# Patient Record
Sex: Female | Born: 1935 | Race: White | Hispanic: No | Smoking: Former smoker
Health system: Southern US, Community
[De-identification: ages and names within clinical notes are randomized; demographics above are authoritative.]

## PROBLEM LIST (undated history)

## (undated) DIAGNOSIS — M810 Age-related osteoporosis without current pathological fracture: Secondary | ICD-10-CM

## (undated) DIAGNOSIS — B1920 Unspecified viral hepatitis C without hepatic coma: Secondary | ICD-10-CM

## (undated) DIAGNOSIS — Z9289 Personal history of other medical treatment: Secondary | ICD-10-CM

## (undated) DIAGNOSIS — E039 Hypothyroidism, unspecified: Secondary | ICD-10-CM

## (undated) DIAGNOSIS — I350 Nonrheumatic aortic (valve) stenosis: Secondary | ICD-10-CM

## (undated) DIAGNOSIS — E7800 Pure hypercholesterolemia, unspecified: Secondary | ICD-10-CM

## (undated) DIAGNOSIS — R140 Abdominal distension (gaseous): Secondary | ICD-10-CM

## (undated) DIAGNOSIS — K254 Chronic or unspecified gastric ulcer with hemorrhage: Secondary | ICD-10-CM

## (undated) DIAGNOSIS — R17 Unspecified jaundice: Secondary | ICD-10-CM

## (undated) DIAGNOSIS — I4891 Unspecified atrial fibrillation: Secondary | ICD-10-CM

## (undated) DIAGNOSIS — C801 Malignant (primary) neoplasm, unspecified: Secondary | ICD-10-CM

## (undated) HISTORY — DX: Unspecified jaundice: R17

## (undated) HISTORY — DX: Hypothyroidism, unspecified: E03.9

## (undated) HISTORY — PX: FRACTURE SURGERY: SHX138

## (undated) HISTORY — DX: Unspecified atrial fibrillation: I48.91

## (undated) HISTORY — DX: Age-related osteoporosis without current pathological fracture: M81.0

## (undated) HISTORY — DX: Abdominal distension (gaseous): R14.0

---

## 1989-03-30 HISTORY — PX: CATARACT EXTRACTION W/ INTRAOCULAR LENS  IMPLANT, BILATERAL: SHX1307

## 2010-07-30 HISTORY — PX: HIP FRACTURE SURGERY: SHX118

## 2014-09-16 ENCOUNTER — Encounter: Payer: Self-pay | Admitting: *Deleted

## 2014-09-20 ENCOUNTER — Encounter: Payer: Self-pay | Admitting: Internal Medicine

## 2014-09-20 ENCOUNTER — Ambulatory Visit (INDEPENDENT_AMBULATORY_CARE_PROVIDER_SITE_OTHER): Payer: Self-pay | Admitting: Internal Medicine

## 2014-09-20 VITALS — BP 122/68 | HR 50 | Ht <= 58 in | Wt 120.2 lb

## 2014-09-20 DIAGNOSIS — I350 Nonrheumatic aortic (valve) stenosis: Secondary | ICD-10-CM

## 2014-09-20 DIAGNOSIS — I4891 Unspecified atrial fibrillation: Secondary | ICD-10-CM

## 2014-09-20 LAB — CBC
HCT: 39.9 % (ref 36.0–46.0)
Hemoglobin: 13.3 g/dL (ref 12.0–15.0)
MCHC: 33.3 g/dL (ref 30.0–36.0)
MCV: 91.8 fl (ref 78.0–100.0)
PLATELETS: 108 10*3/uL — AB (ref 150.0–400.0)
RBC: 4.34 Mil/uL (ref 3.87–5.11)
RDW: 13.8 % (ref 11.5–15.5)
WBC: 4.5 10*3/uL (ref 4.0–10.5)

## 2014-09-20 LAB — COMPREHENSIVE METABOLIC PANEL
ALK PHOS: 47 U/L (ref 39–117)
ALT: 75 U/L — ABNORMAL HIGH (ref 0–35)
AST: 73 U/L — AB (ref 0–37)
Albumin: 3.8 g/dL (ref 3.5–5.2)
BUN: 16 mg/dL (ref 6–23)
CALCIUM: 9.8 mg/dL (ref 8.4–10.5)
CO2: 29 meq/L (ref 19–32)
Chloride: 107 mEq/L (ref 96–112)
Creatinine, Ser: 0.57 mg/dL (ref 0.40–1.20)
GFR: 107.59 mL/min (ref >60.00)
Glucose, Bld: 76 mg/dL (ref 70–99)
Potassium: 4.1 mEq/L (ref 3.5–5.1)
Sodium: 143 mEq/L (ref 135–145)
TOTAL PROTEIN: 6.7 g/dL (ref 6.0–8.3)
Total Bilirubin: 0.4 mg/dL (ref 0.2–1.2)

## 2014-09-20 LAB — TSH: TSH: 0.46 u[IU]/mL (ref 0.35–4.50)

## 2014-09-20 NOTE — Progress Notes (Signed)
Cardiology Office Note   Date:  09/20/2014   ID:  [name], [name] 02/10/1929, MRN [id number]  PCP:  No primary care provider on file.  Cardiologist:   Dorris Carnes, MD   No chief complaint on file. Patinet presents for continued evaluation of CP, atrial fib and aortic stenosis.      History of Present Illness: Diana Pearson is a 81 y.o. female with a history of atrial fib,  Had been complaining of L sided CP with SOB  In October.   He was admitted to [facility]  in October with afib with RVR  Converted to SR.  Troponin was elevated (2.8 max), felt due to strain.  Note that synthyrod had been increased due to elevated TSH   Patinet also had echo which showed moderate to severe AS    Since October the patients daughter says she has been doing pretty good  Breathing is OK  No palpitations  No sweats  No CP         Current Outpatient Prescriptions  Medication Sig Dispense Refill  . levothyroxine (SYNTHROID, LEVOTHROID) 88 MCG tablet Take 88 mcg by mouth daily before breakfast.    . metoprolol tartrate (LOPRESSOR) 25 MG tablet Take 12.5 mg by mouth 2 (two) times daily.    . pravastatin (PRAVACHOL) 20 MG tablet Take 20 mg by mouth daily.     No current facility-administered medications for this visit.    Allergies:   Review of patient's allergies indicates no known allergies.   Past Medical History  Diagnosis Date  . Hypothyroidism   . Osteoporosis   . Abdominal bloating     Past Surgical History  Procedure Laterality Date  . Right hip fracture       Social History:  The patient  reports that she has never smoked. She does not have any smokeless tobacco history on file. She reports that she does not drink alcohol or use illicit drugs.   Family History:  The patient's family history includes Stroke in her mother.    ROS:  Please see the history of present illness. All other systems are reviewed and  Negative to the above problem except as noted.    PHYSICAL  EXAM: VS:  BP 122/68 mmHg  Pulse 50  Ht '4\' 9"'  (1.448 m)  Wt 120 lb 3.2 oz (54.522 kg)  BMI 26.00 kg/m2  GEN: Well nourished, well developed, in no acute distress HEENT: normal Neck: no JVD, carotid bruits, or masses Cardiac: RRR; Gr III/VI systolic murmur LSB rubs, or gallops,no edema  Respiratory:  clear to auscultation bilaterally, normal work of breathing GI: soft, nontender, nondistended, + BS  No hepatomegaly  MS: no deformity Moving all extremities   Skin: warm and dry, no rash Neuro:  Strength and sensation are intact Psych: euthymic mood, full affect   EKG:  EKG is ordered today.   Lipid Panel No results found for: CHOL, TRIG, HDL, CHOLHDL, VLDL, LDLCALC, LDLDIRECT    Wt Readings from Last 3 Encounters:  09/20/14 120 lb 3.2 oz (54.522 kg)      ASSESSMENT AND PLAN: 1.  Atrial fib  Currently in SR  I would recomm anticoagulation if possible Will check labs today  2. AOrtic stensosis  Moderate on echo  Wiould get f/u echo in May    3.  Hx of elevated troponin  Pt currently without CP  May have been demand ischemia in setting of afib with RVR.  Without symtpoms I would not  push for further testing  Only if develops symtpoms      Current medicines are reviewed at length with the patient today.  The patient does not have concerns regarding medicines.  The following changes have been made: N\o changes    Labs/ tests ordered today include: CBC, CMET, TSH  Echo in April    Orders Placed This Encounter  Procedures  . CBC  . Comp Met (CMET)  . TSH  . EKG 12-Lead  . 2D Echocardiogram without contrast     Disposition:   FU with  in   Signed, Dorris Carnes, MD  09/20/2014 3:07 PM    [facility] [place], [place], [place]  [address] Phone: [phone]; Fax: [phone]

## 2014-09-20 NOTE — Patient Instructions (Signed)
Your physician recommends that you continue on your current medications as directed. Please refer to the Current Medication list given to you today. Your physician recommends that you return for lab work in: today (cbc, cmet, tsh)  Your physician has requested that you have an echocardiogram. Echocardiography is a painless test that uses sound waves to create images of your heart. It provides your doctor with information about the size and shape of your heart and how well your heart's chambers and valves are working. This procedure takes approximately one hour. There are no restrictions for this procedure.  PLEASE SCHEDULE THE ECHO FOR April OR May 2016   Your physician recommends that you schedule a follow-up appointment in: May/June 2016 with Dr. Harrington Challenger.

## 2014-09-30 ENCOUNTER — Telehealth: Payer: Self-pay | Admitting: Internal Medicine

## 2014-09-30 DIAGNOSIS — R7989 Other specified abnormal findings of blood chemistry: Secondary | ICD-10-CM

## 2014-09-30 NOTE — Telephone Encounter (Signed)
New Msg       Pt daughter Geoffery Lyons returning call.    Please call back.

## 2014-10-01 NOTE — Telephone Encounter (Signed)
Labs show platelets are a little low Also , liver enzymes are elevated-- not severely, more mild But need to evaluate before starting anticoagulation   Thyroid function is normal  Recomm: Repeat Liver panel, CBC, Gamma GGT, ANA   Daughter informed.  She verbalizes understanding.  Pt will come Tuesday for labs.

## 2014-10-05 ENCOUNTER — Other Ambulatory Visit (INDEPENDENT_AMBULATORY_CARE_PROVIDER_SITE_OTHER): Payer: Self-pay

## 2014-10-05 DIAGNOSIS — R7989 Other specified abnormal findings of blood chemistry: Secondary | ICD-10-CM

## 2014-10-05 LAB — HEPATIC FUNCTION PANEL
ALT: 85 U/L — AB (ref 0–35)
AST: 79 U/L — AB (ref 0–37)
Albumin: 4 g/dL (ref 3.5–5.2)
Alkaline Phosphatase: 54 U/L (ref 39–117)
Bilirubin, Direct: 0.2 mg/dL (ref 0.0–0.3)
Total Bilirubin: 0.6 mg/dL (ref 0.2–1.2)
Total Protein: 7.2 g/dL (ref 6.0–8.3)

## 2014-10-05 LAB — CBC
HCT: 42.7 % (ref 36.0–46.0)
Hemoglobin: 14.4 g/dL (ref 12.0–15.0)
MCHC: 33.8 g/dL (ref 30.0–36.0)
MCV: 91.2 fl (ref 78.0–100.0)
PLATELETS: 110 10*3/uL — AB (ref 150.0–400.0)
RBC: 4.68 Mil/uL (ref 3.87–5.11)
RDW: 13.7 % (ref 11.5–15.5)
WBC: 4 10*3/uL (ref 4.0–10.5)

## 2014-10-05 LAB — GAMMA GT: GGT: 37 U/L (ref 7–51)

## 2014-10-06 ENCOUNTER — Telehealth: Payer: Self-pay | Admitting: Internal Medicine

## 2014-10-06 LAB — ANA: ANA: NEGATIVE

## 2014-10-06 NOTE — Telephone Encounter (Signed)
New problem   Pt's daughter want to know results of labs that was taken yesterday. Please advise.

## 2014-10-11 MED ORDER — PRAVASTATIN SODIUM 20 MG PO TABS: 20.0000 mg | ORAL_TABLET | Freq: Every day | ORAL | Status: DC

## 2014-10-11 MED ORDER — METOPROLOL TARTRATE 25 MG PO TABS: 12.5000 mg | ORAL_TABLET | Freq: Two times a day (BID) | ORAL | Status: DC

## 2014-10-11 NOTE — Telephone Encounter (Signed)
I made Dr Harrington Challenger aware that I was attempting to get [place] of liver done at [facility] in Oct 2015.  Dr Harrington Challenger asked me to check to see if pt had Hepatitis titers done at [facility] in Oct 2015 before I scheduled pt to have them here.  I spoke with [facility] medical records, [phone] and she did confirm pt had abdominal ultrasound and a series of lab done there in October 2015, she could not tell me if Hepatitis titers had been done.  [facility] medical records would not give me results over the telephone.  [facility] medical records  asked me to fax a cover sheet on our fax letter head with the information Dr Harrington Challenger was requesting to them at [phone] and she will fax records to Dr Harrington Challenger.   I have faxed this to [place] records. Pt's daughter is aware that Dr Harrington Challenger is requesting records from [facility], will review them, then make a decision if further testing is necessary.

## 2014-10-11 NOTE — Telephone Encounter (Signed)
Result Note     I have reviewed labs with GI service    With liver enzymes being elevated and platelets being a little low woud recomm checking     Hepatitis markers (Hep C antibody, Hep B antibody, Hep E antibody, Hep A antibody)    Would also recomm a USN of liver to look for cirrhosis    Important to do this if thinking about a blood thinner.    Informed patient's daughter. Will bring patient tomorrow for labs Thinks pt had an US liver in October at [facility].

## 2014-10-11 NOTE — Telephone Encounter (Signed)
F/U ° ° ° ° ° ° ° ° ° °Pt returning call. Please call back.  °

## 2014-10-28 ENCOUNTER — Telehealth: Payer: Self-pay | Admitting: *Deleted

## 2014-10-28 NOTE — Telephone Encounter (Signed)
Left message at medical records at [facility] [phone], stating Dr. Harrington Challenger has not received the requested records. Requested call back.

## 2014-10-29 DIAGNOSIS — I350 Nonrheumatic aortic (valve) stenosis: Secondary | ICD-10-CM

## 2014-10-29 HISTORY — DX: Nonrheumatic aortic (valve) stenosis: I35.0

## 2014-11-15 ENCOUNTER — Ambulatory Visit (HOSPITAL_COMMUNITY): Payer: Self-pay | Attending: Cardiovascular Disease | Admitting: Radiology

## 2014-11-15 DIAGNOSIS — R079 Chest pain, unspecified: Secondary | ICD-10-CM

## 2014-11-15 DIAGNOSIS — I359 Nonrheumatic aortic valve disorder, unspecified: Secondary | ICD-10-CM

## 2014-11-15 DIAGNOSIS — I350 Nonrheumatic aortic (valve) stenosis: Secondary | ICD-10-CM | POA: Insufficient documentation

## 2014-11-15 DIAGNOSIS — I4891 Unspecified atrial fibrillation: Secondary | ICD-10-CM | POA: Insufficient documentation

## 2014-11-15 DIAGNOSIS — R011 Cardiac murmur, unspecified: Secondary | ICD-10-CM

## 2014-11-15 NOTE — Progress Notes (Signed)
Echocardiogram performed.  

## 2014-12-03 ENCOUNTER — Telehealth: Payer: Self-pay | Admitting: Internal Medicine

## 2014-12-03 DIAGNOSIS — R748 Abnormal levels of other serum enzymes: Secondary | ICD-10-CM

## 2014-12-03 NOTE — Telephone Encounter (Signed)
Patient calling about GI referral, according to result notes (below) from hepatic panel this would be appropriate. Consulted Dr. Harrington Challenger, and she recommended GI referral. Referral has been ordered and patient is aware.   Notes Recorded by Rodman Key, RN on 10/11/2014 at 1:11 PM Patient's daughter informed. Will bring patient tomorrow for blood work Thinks there was an [place] of liver in records from October at [facility].  Notes Recorded by Fay Records, MD on 10/11/2014 at 9:02 AM I have reviewed labs with GI service With liver enzymes being elevated and platelets being a little low woud recomm checking  Hepatitis markers (Hep C antibody, Hep B antibody, Hep E antibody, Hep A antibody) Would also recomm a USN of liver to look for cirrhosis Important to do this if thinking about a blood thinner.

## 2014-12-03 NOTE — Telephone Encounter (Signed)
New message      Daughter want to know if Dr Harrington Challenger would referr pt to see a GI?  She does not have a PCP and need to be seen because of her liver

## 2014-12-06 ENCOUNTER — Encounter: Payer: Self-pay | Admitting: Gastroenterology

## 2014-12-08 ENCOUNTER — Other Ambulatory Visit: Payer: Self-pay

## 2014-12-13 ENCOUNTER — Ambulatory Visit (INDEPENDENT_AMBULATORY_CARE_PROVIDER_SITE_OTHER): Payer: No Typology Code available for payment source | Admitting: Gastroenterology

## 2014-12-13 ENCOUNTER — Other Ambulatory Visit (INDEPENDENT_AMBULATORY_CARE_PROVIDER_SITE_OTHER): Payer: No Typology Code available for payment source

## 2014-12-13 ENCOUNTER — Encounter: Payer: Self-pay | Admitting: Gastroenterology

## 2014-12-13 VITALS — BP 128/72 | HR 60 | Ht <= 58 in | Wt 120.0 lb

## 2014-12-13 DIAGNOSIS — R7989 Other specified abnormal findings of blood chemistry: Secondary | ICD-10-CM

## 2014-12-13 DIAGNOSIS — R945 Abnormal results of liver function studies: Principal | ICD-10-CM

## 2014-12-13 DIAGNOSIS — R932 Abnormal findings on diagnostic imaging of liver and biliary tract: Secondary | ICD-10-CM

## 2014-12-13 DIAGNOSIS — D696 Thrombocytopenia, unspecified: Secondary | ICD-10-CM

## 2014-12-13 LAB — FERRITIN: FERRITIN: 237.3 ng/mL (ref 10.0–291.0)

## 2014-12-13 LAB — PROTIME-INR
INR: 1.1 ratio — AB (ref 0.8–1.0)
PROTHROMBIN TIME: 11.7 s (ref 9.6–13.1)

## 2014-12-13 NOTE — Progress Notes (Signed)
    History of Present Illness: This is an 81 year old female referred by Dorris Carnes, MD for the evaluation of elevated transaminases. Patient is accompanied by her daughter who provides translation. Patient has severe aortic stenosis by recent echocardiogram and she has atrial fibrillation. Records show transaminases have been mildly elevated in the range of twice normal dating back to blood work from [facility] in October 2015. ANA negative. No other hepatic serologies performed. Daughter relates that Hep C was diagnosed 10 years ago in [place], [place] when elevated LFTs were noted at that time. Daughter states LFTs have been elevated over the past 10 years. I reviewed records from [facility] from October 2015. Abdominal ultrasound was performed on 05/06/2014 findings of tiny left renal cysts, several scattered non-shadowing echogenic foci in the liver and questionable nodular contour of the liver-possible cirrhosis. Recent CBC showed platelets 108k.  Denies weight loss, abdominal pain, constipation, diarrhea, change in stool caliber, melena, hematochezia, nausea, vomiting, dysphagia, reflux symptoms, chest pain.  Review of Systems: Pertinent positive and negative review of systems were noted in the above HPI section. All other review of systems were otherwise negative.  Current Medications, Allergies, Past Medical History, Past Surgical History, Family History and Social History were reviewed in [employer] record.  Physical Exam: General: Well developed, well nourished, elderly, no acute distress Head: Normocephalic and atraumatic Eyes:  sclerae anicteric, EOMI Ears: Normal auditory acuity Mouth: No deformity or lesions Neck: Supple, no masses or thyromegaly Lungs: Clear throughout to auscultation Heart: Regular rate and rhythm; 3/6 harsh systolic murmur, no rubs or bruits Abdomen: Soft, non tender and non distended. No masses, hepatosplenomegaly or hernias noted. Normal Bowel  sounds Musculoskeletal: Symmetrical with no gross deformities  Skin: No lesions on visible extremities Pulses:  Normal pulses noted Extremities: No clubbing, cyanosis, edema or deformities noted Neurological: Alert oriented x 4, grossly nonfocal Cervical Nodes:  No significant cervical adenopathy Inguinal Nodes: No significant inguinal adenopathy Psychological:  Alert and cooperative. Normal mood and affect  Assessment and Recommendations:  1. Elevated transaminases. History of Hepatitis C. Abnormal hepatic ultrasound as above with echogenic non-shadowing hepatic foci and questionable nodular contour. Mild thrombocytopenia, also be secondary to hypersplenism versus other causes. R/O cirrhosis. Standard hepatic serologies. Consider abd CT. Further plans pending blood work results.   2. Aortic stenosis, severe.   3. Patient and her daughter are strongly advised to obtain a PCP  cc: Dorris Carnes, MD

## 2014-12-13 NOTE — Patient Instructions (Signed)
Your physician has requested that you go to the basement for  lab work before leaving today.   

## 2014-12-14 LAB — ANA: Anti Nuclear Antibody(ANA): NEGATIVE

## 2014-12-14 LAB — HEPATITIS C ANTIBODY: HCV Ab: REACTIVE — AB

## 2014-12-14 LAB — ANTI-SMOOTH MUSCLE ANTIBODY, IGG: Smooth Muscle Ab: 19 U (ref <20)

## 2014-12-14 LAB — HEPATITIS B SURFACE ANTIGEN: Hepatitis B Surface Ag: NEGATIVE

## 2014-12-14 LAB — HEPATITIS B SURFACE ANTIBODY,QUALITATIVE: HEP B S AB: POSITIVE — AB

## 2014-12-14 LAB — MITOCHONDRIAL ANTIBODIES: Mitochondrial M2 Ab, IgG: 1.34 — ABNORMAL HIGH (ref <0.91)

## 2014-12-15 LAB — CERULOPLASMIN: Ceruloplasmin: 26 mg/dL (ref 18–53)

## 2014-12-15 LAB — HEPATITIS C RNA QUANTITATIVE
HCV Quantitative Log: 6.75 {Log} — ABNORMAL HIGH (ref <1.18)
HCV Quantitative: 5647917 IU/mL — ABNORMAL HIGH (ref <15)

## 2014-12-16 ENCOUNTER — Other Ambulatory Visit: Payer: Self-pay

## 2014-12-16 DIAGNOSIS — R7989 Other specified abnormal findings of blood chemistry: Secondary | ICD-10-CM

## 2014-12-16 DIAGNOSIS — R932 Abnormal findings on diagnostic imaging of liver and biliary tract: Secondary | ICD-10-CM

## 2014-12-16 DIAGNOSIS — R945 Abnormal results of liver function studies: Secondary | ICD-10-CM

## 2014-12-20 ENCOUNTER — Other Ambulatory Visit (INDEPENDENT_AMBULATORY_CARE_PROVIDER_SITE_OTHER): Payer: No Typology Code available for payment source

## 2014-12-20 DIAGNOSIS — R945 Abnormal results of liver function studies: Secondary | ICD-10-CM

## 2014-12-20 DIAGNOSIS — R7989 Other specified abnormal findings of blood chemistry: Secondary | ICD-10-CM

## 2014-12-20 DIAGNOSIS — R932 Abnormal findings on diagnostic imaging of liver and biliary tract: Secondary | ICD-10-CM

## 2014-12-20 LAB — BUN: BUN: 17 mg/dL (ref 6–23)

## 2014-12-20 LAB — IBC PANEL
Iron: 138 ug/dL (ref 42–145)
SATURATION RATIOS: 32.7 % (ref 20.0–50.0)
Transferrin: 301 mg/dL (ref 212.0–360.0)

## 2014-12-20 LAB — CREATININE, SERUM: Creatinine, Ser: 0.71 mg/dL (ref 0.40–1.20)

## 2014-12-23 ENCOUNTER — Encounter: Payer: Self-pay | Admitting: Internal Medicine

## 2014-12-23 ENCOUNTER — Other Ambulatory Visit: Payer: Self-pay

## 2014-12-23 ENCOUNTER — Ambulatory Visit (INDEPENDENT_AMBULATORY_CARE_PROVIDER_SITE_OTHER): Payer: No Typology Code available for payment source | Admitting: Internal Medicine

## 2014-12-23 ENCOUNTER — Ambulatory Visit (INDEPENDENT_AMBULATORY_CARE_PROVIDER_SITE_OTHER)
Admission: RE | Admit: 2014-12-23 | Discharge: 2014-12-23 | Disposition: A | Discharge status: Home / Self Care | Payer: No Typology Code available for payment source | Source: Ambulatory Visit | Attending: Gastroenterology | Admitting: Gastroenterology

## 2014-12-23 VITALS — BP 160/78 | HR 70 | Ht <= 58 in | Wt 121.8 lb

## 2014-12-23 DIAGNOSIS — R7989 Other specified abnormal findings of blood chemistry: Secondary | ICD-10-CM

## 2014-12-23 DIAGNOSIS — R932 Abnormal findings on diagnostic imaging of liver and biliary tract: Secondary | ICD-10-CM

## 2014-12-23 DIAGNOSIS — R945 Abnormal results of liver function studies: Secondary | ICD-10-CM

## 2014-12-23 DIAGNOSIS — I350 Nonrheumatic aortic (valve) stenosis: Secondary | ICD-10-CM

## 2014-12-23 DIAGNOSIS — N83202 Unspecified ovarian cyst, left side: Secondary | ICD-10-CM

## 2014-12-23 DIAGNOSIS — I480 Paroxysmal atrial fibrillation: Secondary | ICD-10-CM

## 2014-12-23 MED ORDER — IOHEXOL 300 MG/ML  SOLN
100.0000 mL | Freq: Once | INTRAMUSCULAR | Status: AC | PRN
  Administered 2014-12-23: 100 mL via INTRAVENOUS

## 2014-12-23 NOTE — Progress Notes (Signed)
Cardiology Office Note   Date:  12/23/2014   ID:  Diana Pearson, DOB 06-14-1929, MRN [id number]  PCP:  No PCP Per Patient  Cardiologist:   Dorris Carnes, MD   No chief complaint on file.  F/u of atrial fib and aortic stenosis    History of Present Illness: Diana Pearson is a 81 y.o. female with a history of atrial fib, echo, Hyperlipidemia and liver dz I saw her in clinic in feb  She had an echo after this visit that showed severe AS. She has also been seen in GI by Barth Kirks  Evaluation of liver dysfunction  SInce seen she denies CP  No Dizziness  No SOB  No palpitations  Sleeping good  Current Outpatient Prescriptions  Medication Sig Dispense Refill  . levothyroxine (SYNTHROID, LEVOTHROID) 88 MCG tablet Take 88 mcg by mouth daily before breakfast.    . metoprolol tartrate (LOPRESSOR) 25 MG tablet Take 0.5 tablets (12.5 mg total) by mouth 2 (two) times daily. 45 tablet 3  . pravastatin (PRAVACHOL) 20 MG tablet Take 1 tablet (20 mg total) by mouth daily. 90 tablet 3   No current facility-administered medications for this visit.    Allergies:   Review of patient's allergies indicates no known allergies.   Past Medical History  Diagnosis Date  . Hypothyroidism   . Osteoporosis   . Abdominal bloating   . Atrial fibrillation   . Hepatitis     C    Past Surgical History  Procedure Laterality Date  . Right hip fracture       Social History:  The patient  reports that she has never smoked. She does not have any smokeless tobacco history on file. She reports that she does not drink alcohol or use illicit drugs.   Family History:  The patient's family history includes Stroke in her mother.    ROS:  Please see the history of present illness. All other systems are reviewed and  Negative to the above problem except as noted.    PHYSICAL EXAM: VS:  BP 160/78 mmHg  Pulse 70  Ht 4\' 9"  (1.448 m)  Wt 121 lb 12.8 oz (55.248 kg)  BMI 26.35 kg/m2  GEN: Well nourished, well  developed, in no acute distress HEENT: normal Neck: no JVD, carotid bruits, or masses Cardiac: RRR; Gr III/VI systolic murmur base  , rubs, or gallops,no edema  Respiratory:  clear to auscultation bilaterally, normal work of breathing GI: soft, nontender, nondistended, + BS  No hepatomegaly  MS: no deformity Moving all extremities   Skin: warm and dry, no rash Neuro:  Strength and sensation are intact Psych: euthymic mood, full affect   EKG:  EKG is not ordered today.   Lipid Panel No results found for: CHOL, TRIG, HDL, CHOLHDL, VLDL, LDLCALC, LDLDIRECT    Wt Readings from Last 3 Encounters:  12/23/14 121 lb 12.8 oz (55.248 kg)  12/13/14 120 lb (54.432 kg)  09/20/14 120 lb 3.2 oz (54.522 kg)      ASSESSMENT AND PLAN:  1.  Atrial fibrillation  CHADSVASc 3   No known recurrence.  The patient is currently not on anticoag  Need to clarify severity of liiver dz/    2.  Aortic stenosis  Severe by echo  She denies sympotms  Will follow clinically   3.  Liver Patient has been seen in GI  Will have CT today     Current medicines are reviewed at length with the patient today.  The patient does not have concerns regarding medicines.  The following changes have been made: NOne  Labs/ tests ordered today include:  Patinet to have CT scan now   No orders of the defined types were placed in this encounter.     Disposition:   FU with me in a few months  Signed, Dorris Carnes, MD  12/23/2014 10:14 AM    [facility] [address], [place], [place]  [address] Phone: [phone]; Fax: [phone]

## 2014-12-23 NOTE — Patient Instructions (Signed)
Medication Instructions:  NONE  Labwork: NONE  Testing/Procedures: NONE  Follow-Up: Your physician wants you to follow-up in: 5 MONTHS You will receive a reminder letter in the mail two months in advance. If you don't receive a letter, please call our office to schedule the follow-up appointment.   Any Other Special Instructions Will Be Listed Below (If Applicable).  CT SCAN TODAY

## 2014-12-23 NOTE — Addendum Note (Signed)
Addended by: Marlon Pel on: 12/23/2014 02:28 PM   Modules accepted: Orders

## 2014-12-24 ENCOUNTER — Ambulatory Visit: Payer: Self-pay | Admitting: Internal Medicine

## 2014-12-24 ENCOUNTER — Telehealth: Payer: Self-pay

## 2014-12-24 DIAGNOSIS — B1920 Unspecified viral hepatitis C without hepatic coma: Secondary | ICD-10-CM

## 2014-12-24 NOTE — Telephone Encounter (Signed)
Left message for patient to call back Referral placed for ID- Dr. Linus Salmons

## 2014-12-24 NOTE — Telephone Encounter (Signed)
-----   Message from Ladene Artist, MD sent at 12/23/2014  4:20 PM EDT ----- OK for referral to ID clinic. She needs EGD to assess for varices. Dr. Dorris Carnes wanted assessment of bleeding risk if she were to start anticoagulation. Can you or Cardiology help her get to the right people for [facility] assistance?  ----- Message -----    From: Marlon Pel, RN    Sent: 12/23/2014   2:09 PM      To: Ladene Artist, MD  Did you still want to send her for tx for her Hep C? She does not have any insurance and [facility] requires payment upfront.  Could we send her to Dr. Linus Salmons at [place]?   He also treats Hep C and it is a [facility] clinic.  If she applies and qualifies for the orange card/[facility] assistance she will probably be able to get treatment there.  I believe they have to bring between $300-500 to the first appt with [facility].  Jah Alarid

## 2014-12-24 NOTE — Telephone Encounter (Signed)
Error

## 2014-12-28 NOTE — Telephone Encounter (Signed)
I spoke with the patient's daughter she has an [employer] and she already has discount with [facility] after she pays $20 at that office visits she is covered at 100%.  April verified that this information is correct.  Daughter notified that she will be contacted by ID and the [facility] hospital clinic for an appt

## 2014-12-29 ENCOUNTER — Telehealth: Payer: Self-pay | Admitting: *Deleted

## 2014-12-29 NOTE — Telephone Encounter (Signed)
Called patient and left message with son regarding referral for Hep C. His wife will call back to schedule a lab appt.

## 2014-12-30 ENCOUNTER — Other Ambulatory Visit: Payer: No Typology Code available for payment source

## 2014-12-30 ENCOUNTER — Telehealth: Payer: Self-pay | Admitting: Internal Medicine

## 2014-12-30 DIAGNOSIS — R945 Abnormal results of liver function studies: Secondary | ICD-10-CM

## 2014-12-30 DIAGNOSIS — R7989 Other specified abnormal findings of blood chemistry: Secondary | ICD-10-CM

## 2014-12-30 DIAGNOSIS — R932 Abnormal findings on diagnostic imaging of liver and biliary tract: Secondary | ICD-10-CM

## 2014-12-30 NOTE — Telephone Encounter (Signed)
New Message     Office calling stating that Dr. Harrington Challenger wanted an IFOB on this pt and there is no order in Epic. Please call back and advise.

## 2014-12-30 NOTE — Telephone Encounter (Signed)
Informed the lab at [facility] that per Dr Harrington Challenger she did not order for the pt to have an IFOB done, that Dr Kennedy Bucker GI ordered this, so that's who this should be ordered under to receive and result.  Lab tech verbalized understanding, and gracious for all the assistance provided.

## 2014-12-31 NOTE — Progress Notes (Signed)
Dr Harrington Challenger comments on 11/15/14 echo:  AV is mod to severely narrowed, severely narrowed I have reviewed images     NOted that in past at [facility] but no report to review    Discussed with patient's daughter.    Pt has appt with internal medicine today Not sure of name Told her to call with this contact information and I would forward reprot and clinic note to them        Patient has PAF Should be on anticoag. BUT with liver problems, bleeding risk needs to be clarified They should be seen in GI Does she have varices? I never received USN from [facility].        I will not start on anticoag.     Patinet should have f/u appt with me in August     ----- Message -----     From: Baxter Flattery     Sent: 11/16/2014  3:40 PM      To: Fay Records, MD        ECHO REPORT

## 2015-01-04 ENCOUNTER — Other Ambulatory Visit (INDEPENDENT_AMBULATORY_CARE_PROVIDER_SITE_OTHER): Payer: No Typology Code available for payment source

## 2015-01-04 DIAGNOSIS — B182 Chronic viral hepatitis C: Secondary | ICD-10-CM

## 2015-01-05 LAB — HEPATITIS B CORE ANTIBODY, TOTAL: Hep B Core Total Ab: REACTIVE — AB

## 2015-01-05 LAB — HIV ANTIBODY (ROUTINE TESTING W REFLEX): HIV 1&2 Ab, 4th Generation: NONREACTIVE

## 2015-01-05 LAB — HEPATITIS A ANTIBODY, TOTAL: Hep A Total Ab: REACTIVE — AB

## 2015-01-07 ENCOUNTER — Telehealth: Payer: Self-pay | Admitting: Internal Medicine

## 2015-01-07 LAB — HEPATITIS C GENOTYPE

## 2015-01-07 NOTE — Telephone Encounter (Signed)
Called patient's DPR back. Will forward to Dr. Harrington Challenger to approve a blood thinner medication for patient.

## 2015-01-07 NOTE — Telephone Encounter (Signed)
New Message   Pt wants to confirm that the doctor will approve of the blood thinner medication

## 2015-01-16 NOTE — Telephone Encounter (Signed)
Need results of GI evaluation before I can Rx anticoagulant

## 2015-02-02 ENCOUNTER — Encounter: Payer: No Typology Code available for payment source | Admitting: Internal Medicine

## 2015-02-07 ENCOUNTER — Ambulatory Visit: Payer: Self-pay | Admitting: Gastroenterology

## 2015-02-17 ENCOUNTER — Encounter: Payer: No Typology Code available for payment source | Admitting: Family Medicine

## 2015-03-09 ENCOUNTER — Ambulatory Visit (INDEPENDENT_AMBULATORY_CARE_PROVIDER_SITE_OTHER): Payer: No Typology Code available for payment source | Admitting: Internal Medicine

## 2015-03-09 ENCOUNTER — Encounter: Payer: Self-pay | Admitting: Internal Medicine

## 2015-03-09 ENCOUNTER — Other Ambulatory Visit: Payer: No Typology Code available for payment source

## 2015-03-09 VITALS — BP 144/72 | HR 56 | Temp 98.0°F | Ht <= 58 in | Wt 118.0 lb

## 2015-03-09 DIAGNOSIS — B182 Chronic viral hepatitis C: Secondary | ICD-10-CM | POA: Insufficient documentation

## 2015-03-09 DIAGNOSIS — Z23 Encounter for immunization: Secondary | ICD-10-CM

## 2015-03-09 DIAGNOSIS — K7460 Unspecified cirrhosis of liver: Secondary | ICD-10-CM

## 2015-03-09 MED ORDER — ELBASVIR-GRAZOPREVIR 50-100 MG PO TABS: 1.0000 | ORAL_TABLET | Freq: Every day | ORAL | Status: DC

## 2015-03-09 NOTE — Progress Notes (Signed)
HPI:  +Diana Pearson is a 81 y.o. female who presents for initial evaluation and management of chronic hepatitis C.  Patient tested positive about 10 years ago while in [place]. Hepatitis C risk factors present are: needle stick 40 years ago. Patient denies acupuncture, history of blood transfusion, history of clotting factor transfusion, intranasal drug use, IV drug abuse, multiple sexual partners, renal dialysis, sexual contact with person with liver disease, tattoos. Patient has had other studies performed. Results: hepatitis C RNA by PCR, result: positive. Patient has not had prior treatment for Hepatitis C. Patient does have a past history of liver disease. Patient does not have a family history of liver disease.  She has cirrhosis as noted on cT scan and also low platelets.      Patient does have documented immunity to Hepatitis A. Patient does have documented immunity to Hepatitis B.    Review of Systems:  Constitutional: Negative for fatigue, weight loss.  HENT: Negative for hearing loss, ear pain, neck pain, tinnitus and ear discharge.  Eyes: Negative for icterus, discharge and redness.  Respiratory: Negative fordypsnea, wheezing.  Cardiovascular: Negative for chest pain, palpitations, orthopnea, claudication and leg swelling.  Gastrointestinal: Negative for nausea, vomiting, abdominal distention and abdominal pain.Negative for  Genitourinary: Negative for dysuria, urgency, frequency, hematuria and flank pain.  Musculoskeletal: Negative for arthralgias, arthritis Skin: Negative for itching and rash. Neurological: Negative for dizziness and weakness. Endo/Heme/Allergies: Negative for environmental allergies and polydipsia. Does not bruise/bleed easily.      Past Medical History  Diagnosis Date  . Hypothyroidism   . Osteoporosis   . Abdominal bloating   . Atrial fibrillation   . Hepatitis     C    Prior to Admission medications   Medication Sig Start Date End Date Taking?  Authorizing Provider  levothyroxine (SYNTHROID, LEVOTHROID) 88 MCG tablet Take 88 mcg by mouth daily before breakfast.   Yes Historical Provider, MD  metoprolol tartrate (LOPRESSOR) 25 MG tablet Take 0.5 tablets (12.5 mg total) by mouth 2 (two) times daily. 10/11/14  Yes Fay Records, MD  pravastatin (PRAVACHOL) 20 MG tablet Take 1 tablet (20 mg total) by mouth daily. 10/11/14  Yes Fay Records, MD    No Known Allergies  Social History  Substance Use Topics  . Smoking status: Former Research scientist (life sciences)  . Smokeless tobacco: Never Used  . Alcohol Use: No    Family History  Problem Relation Age of Onset  . Stroke Mother      Objective:   Filed Vitals:   03/09/15 1337  BP: 144/72  Pulse: 56  Temp: 98 F (36.7 C)   GEN: in no apparent distress and alert HEENT: anicteric Cardiac: Cor irreg, irreg RRR and No murmurs Lungs: clear Abdomen: Bowel sounds are normal, liver is not enlarged, spleen is not enlarged Ext: peripheral pulses normal, no pedal edema, no clubbing or cyanosis Skin: negative for - jaundice, spider hemangioma, telangiectasia, palmar erythema, ecchymosis and atrophy Musculoskeletal: no joint swelling  Laboratory Genotype:  Lab Results  Component Value Date   HCVGENOTYPE 1b 01/04/2015   HCV viral load:  Lab Results  Component Value Date   HCVQUANT [id number]* 12/13/2014   Lab Results  Component Value Date   WBC 4.0 10/05/2014   HGB 14.4 10/05/2014   HCT 42.7 10/05/2014   MCV 91.2 10/05/2014   PLT 110.0* 10/05/2014    Lab Results  Component Value Date   CREATININE 0.71 12/20/2014   BUN 17 12/20/2014   NA 143 09/20/2014  K 4.1 09/20/2014   CL 107 09/20/2014   CO2 29 09/20/2014    Lab Results  Component Value Date   ALT 85* 10/05/2014   AST 79* 10/05/2014   ALKPHOS 54 10/05/2014   BILITOT 0.6 10/05/2014   INR 1.1* 12/13/2014      Assessment: Chronic Hepatitis C genotype 1b I discussed with the patient the natural history and progression of chronic  hepatitis C infection including about 30% of people who develop cirrhosis of the liver and once cirrhosis is established there is a 2-7% risk per year of liver cancer and liver failure.    Plan: 1) Patient counseled extensively on limiting acetaminophen to no more than 2 grams daily, avoidance of alcohol. 2) Transmission discussed with patient including sexual transmission, sharing razors and toothbrush.   3) Will need referral to gastroenterology if concern for cirrhosis 4) Will need referral for substance abuse counseling: No. 5) Will prescribe Harvoni, Rozann Lesches or Zepatier for 12 weeks once work up complete 6) Hepatitis A vaccine No. 7) Hepatitis B vaccine No. 8) Pneumovax vaccine today 9) will follow up after starting medication

## 2015-03-09 NOTE — Addendum Note (Signed)
Addended by: Myrtis Hopping A on: 03/09/2015 04:59 PM   Modules accepted: Orders

## 2015-03-10 ENCOUNTER — Encounter: Payer: Self-pay | Admitting: Family Medicine

## 2015-03-10 ENCOUNTER — Other Ambulatory Visit: Payer: Self-pay | Admitting: *Deleted

## 2015-03-10 ENCOUNTER — Ambulatory Visit (INDEPENDENT_AMBULATORY_CARE_PROVIDER_SITE_OTHER): Payer: Self-pay | Admitting: Family Medicine

## 2015-03-10 VITALS — BP 136/56 | HR 59 | Temp 98.7°F | Wt 118.3 lb

## 2015-03-10 DIAGNOSIS — Z780 Asymptomatic menopausal state: Secondary | ICD-10-CM

## 2015-03-10 DIAGNOSIS — N83209 Unspecified ovarian cyst, unspecified side: Secondary | ICD-10-CM | POA: Insufficient documentation

## 2015-03-10 DIAGNOSIS — N83202 Unspecified ovarian cyst, left side: Secondary | ICD-10-CM

## 2015-03-10 DIAGNOSIS — M810 Age-related osteoporosis without current pathological fracture: Secondary | ICD-10-CM

## 2015-03-10 DIAGNOSIS — E039 Hypothyroidism, unspecified: Secondary | ICD-10-CM

## 2015-03-10 DIAGNOSIS — K7460 Unspecified cirrhosis of liver: Secondary | ICD-10-CM

## 2015-03-10 DIAGNOSIS — N8320 Unspecified ovarian cysts: Secondary | ICD-10-CM

## 2015-03-10 DIAGNOSIS — I10 Essential (primary) hypertension: Secondary | ICD-10-CM | POA: Insufficient documentation

## 2015-03-10 MED ORDER — ELBASVIR-GRAZOPREVIR 50-100 MG PO TABS: 1.0000 | ORAL_TABLET | Freq: Every day | ORAL | Status: DC

## 2015-03-10 NOTE — Progress Notes (Signed)
   Subjective:    Patient ID: Diana Pearson, female    DOB: 1928-10-29, 81 y.o.   MRN: [id number]  HPI Patient seen with daughter who acted as interpreter at pt's request.  Patient was referred to our clinic from GI due to 2.7 left ovarian cyst that was found incidentally on Abd/pelvis CT.  CT was done in 11/2014.  She reports no complications - denies pain, bleeding, vaginal irritation, weight gain, weight loss, night sweats.  She does admit to having osteoporosis and was on bisphophonates at one time.  Would like another Bone Density scan.   Review of Systems  Constitutional: Negative for fever, chills and fatigue.  Gastrointestinal: Negative for nausea, vomiting, abdominal pain and diarrhea.  Genitourinary: Negative for decreased urine volume, vaginal bleeding, vaginal discharge and vaginal pain.  All other systems reviewed and are negative.  I have reviewed the patients past medical, family, and social history.  I have reviewed the patient's medication list and allergies.      Objective:   Physical Exam  Constitutional: She is oriented to person, place, and time. She appears well-developed and well-nourished.  HENT:  Head: Normocephalic and atraumatic.  Right Ear: External ear normal.  Left Ear: External ear normal.  Eyes: Conjunctivae are normal. Pupils are equal, round, and reactive to light.  Neck: Normal range of motion. Neck supple. No thyromegaly present.  Cardiovascular: Normal rate, regular rhythm and normal heart sounds.  Exam reveals no gallop and no friction rub.   No murmur heard. Pulmonary/Chest: Effort normal and breath sounds normal. No respiratory distress. She has no wheezes. She has no rales. She exhibits no tenderness.  Abdominal: Soft. Bowel sounds are normal. She exhibits no distension and no mass. There is no tenderness. There is no rebound and no guarding.  Musculoskeletal: She exhibits no edema or tenderness.  Neurological: She is alert and oriented to  person, place, and time. No cranial nerve deficit.  Skin: Skin is warm and dry. No rash noted. No erythema. No pallor.  Psychiatric: She has a normal mood and affect. Her behavior is normal. Judgment and thought content normal.      Assessment & Plan:  1. Cyst of left ovary Discussed with the patient and her daughter that this is likely benign.  Will need F/U US in 1 year.  2. Osteoporosis Dexa scan  3. Hypothyroidism, unspecified hypothyroidism type Last TSH in February.  Appears to be well controlled.  Continue Levothyroxine  4. Post-menopausal - DG Bone Density; Future - DG Bone Density

## 2015-03-10 NOTE — Progress Notes (Signed)
Patient ID: [name], female   DOB: [birth year], 81 y.o.   MRN: [id number]  New patient here to discuss Cyst on that was found last year on ultrasound.

## 2015-03-10 NOTE — Telephone Encounter (Signed)
application

## 2015-03-14 ENCOUNTER — Telehealth: Payer: Self-pay

## 2015-03-14 DIAGNOSIS — N83202 Unspecified ovarian cyst, left side: Secondary | ICD-10-CM

## 2015-03-14 NOTE — Telephone Encounter (Signed)
Per Dr. Nehemiah Settle, pt needs [place] for f/u left ovarian cyst. Scheduled [place] for left ovarian cyst for August 31st @ 1030.  Called pt with interpreter Milbert Coulter and LM that we have scheduled an [place] appt for her on August 31st @ 1030 and if they need to reschedule please call [phone].  And if they have any questions to please call the Clinics.

## 2015-03-21 ENCOUNTER — Other Ambulatory Visit: Payer: No Typology Code available for payment source

## 2015-03-21 ENCOUNTER — Other Ambulatory Visit: Payer: Self-pay | Admitting: *Deleted

## 2015-03-21 ENCOUNTER — Other Ambulatory Visit (INDEPENDENT_AMBULATORY_CARE_PROVIDER_SITE_OTHER): Payer: No Typology Code available for payment source

## 2015-03-21 DIAGNOSIS — K921 Melena: Secondary | ICD-10-CM

## 2015-03-21 LAB — HEMOCCULT SLIDES (X 3 CARDS)
FECAL OCCULT BLD: NEGATIVE
OCCULT 1: NEGATIVE
OCCULT 2: NEGATIVE
OCCULT 3: NEGATIVE
OCCULT 4: NEGATIVE
OCCULT 5: NEGATIVE

## 2015-03-23 ENCOUNTER — Telehealth: Payer: Self-pay | Admitting: *Deleted

## 2015-03-23 ENCOUNTER — Other Ambulatory Visit: Payer: Self-pay | Admitting: *Deleted

## 2015-03-23 DIAGNOSIS — R112 Nausea with vomiting, unspecified: Secondary | ICD-10-CM

## 2015-03-23 MED ORDER — ONDANSETRON HCL 4 MG PO TABS: 4.0000 mg | ORAL_TABLET | Freq: Three times a day (TID) | ORAL | Status: DC | PRN

## 2015-03-23 NOTE — Telephone Encounter (Signed)
Patient's daughter calling to report nausea and abdominal pain since starting her new medication.  Patient has a history of ulcers, they are concerned that may affect how she is able to take her new medication. Please advise. Daughter: [phone] Landis Gandy, RN

## 2015-03-24 ENCOUNTER — Other Ambulatory Visit: Payer: Self-pay | Admitting: *Deleted

## 2015-03-24 DIAGNOSIS — R112 Nausea with vomiting, unspecified: Secondary | ICD-10-CM

## 2015-03-24 MED ORDER — ONDANSETRON HCL 4 MG PO TABS: 4.0000 mg | ORAL_TABLET | Freq: Three times a day (TID) | ORAL | Status: DC | PRN

## 2015-03-30 ENCOUNTER — Ambulatory Visit (HOSPITAL_COMMUNITY): Payer: Self-pay

## 2015-04-01 ENCOUNTER — Telehealth: Payer: Self-pay | Admitting: Internal Medicine

## 2015-04-01 NOTE — Telephone Encounter (Signed)
Patient's daughter called stating that her mother has yellow skin and eyes, she is complaining of abdominal pain, chills and sweats. Patient's daughter wanted Dr. Megan Salon to be aware of this.

## 2015-04-05 NOTE — Telephone Encounter (Signed)
If still having problems, could do a CMP now to see if jaundiced.  thanks

## 2015-04-05 NOTE — Telephone Encounter (Signed)
Called the patient to check if she is still having trouble and no one answered. Patient coming in for labs 04/06/15 tomorrow.

## 2015-04-06 ENCOUNTER — Encounter: Payer: Self-pay | Admitting: Infectious Disease

## 2015-04-06 ENCOUNTER — Other Ambulatory Visit: Payer: No Typology Code available for payment source

## 2015-04-06 ENCOUNTER — Inpatient Hospital Stay (HOSPITAL_COMMUNITY): Payer: Medicaid Other

## 2015-04-06 ENCOUNTER — Ambulatory Visit (INDEPENDENT_AMBULATORY_CARE_PROVIDER_SITE_OTHER): Payer: No Typology Code available for payment source | Admitting: Infectious Disease

## 2015-04-06 ENCOUNTER — Inpatient Hospital Stay (HOSPITAL_COMMUNITY)
Admission: AD | Admit: 2015-04-06 | Discharge: 2015-04-15 | DRG: 408 | Disposition: A | Discharge status: Home Health | Payer: Medicaid Other | Source: Ambulatory Visit | Attending: Internal Medicine | Admitting: Internal Medicine

## 2015-04-06 ENCOUNTER — Other Ambulatory Visit: Payer: Self-pay | Admitting: Infectious Disease

## 2015-04-06 ENCOUNTER — Encounter (HOSPITAL_COMMUNITY): Payer: Self-pay | Admitting: General Practice

## 2015-04-06 VITALS — BP 111/66 | HR 67 | Temp 97.6°F | Wt 113.0 lb

## 2015-04-06 DIAGNOSIS — R17 Unspecified jaundice: Secondary | ICD-10-CM

## 2015-04-06 DIAGNOSIS — I350 Nonrheumatic aortic (valve) stenosis: Secondary | ICD-10-CM | POA: Diagnosis present

## 2015-04-06 DIAGNOSIS — R110 Nausea: Secondary | ICD-10-CM

## 2015-04-06 DIAGNOSIS — K7460 Unspecified cirrhosis of liver: Secondary | ICD-10-CM | POA: Diagnosis present

## 2015-04-06 DIAGNOSIS — E039 Hypothyroidism, unspecified: Secondary | ICD-10-CM

## 2015-04-06 DIAGNOSIS — I10 Essential (primary) hypertension: Secondary | ICD-10-CM

## 2015-04-06 DIAGNOSIS — M810 Age-related osteoporosis without current pathological fracture: Secondary | ICD-10-CM | POA: Diagnosis present

## 2015-04-06 DIAGNOSIS — C221 Intrahepatic bile duct carcinoma: Secondary | ICD-10-CM

## 2015-04-06 DIAGNOSIS — I81 Portal vein thrombosis: Secondary | ICD-10-CM | POA: Diagnosis present

## 2015-04-06 DIAGNOSIS — B182 Chronic viral hepatitis C: Secondary | ICD-10-CM

## 2015-04-06 DIAGNOSIS — Z79899 Other long term (current) drug therapy: Secondary | ICD-10-CM | POA: Diagnosis not present

## 2015-04-06 DIAGNOSIS — I4891 Unspecified atrial fibrillation: Secondary | ICD-10-CM | POA: Diagnosis present

## 2015-04-06 DIAGNOSIS — R160 Hepatomegaly, not elsewhere classified: Secondary | ICD-10-CM | POA: Insufficient documentation

## 2015-04-06 DIAGNOSIS — K7200 Acute and subacute hepatic failure without coma: Secondary | ICD-10-CM

## 2015-04-06 DIAGNOSIS — K7469 Other cirrhosis of liver: Secondary | ICD-10-CM

## 2015-04-06 DIAGNOSIS — E785 Hyperlipidemia, unspecified: Secondary | ICD-10-CM | POA: Diagnosis present

## 2015-04-06 DIAGNOSIS — R140 Abdominal distension (gaseous): Secondary | ICD-10-CM

## 2015-04-06 DIAGNOSIS — Z87891 Personal history of nicotine dependence: Secondary | ICD-10-CM

## 2015-04-06 HISTORY — DX: Unspecified jaundice: R17

## 2015-04-06 HISTORY — DX: Nonrheumatic aortic (valve) stenosis: I35.0

## 2015-04-06 HISTORY — DX: Unspecified viral hepatitis C without hepatic coma: B19.20

## 2015-04-06 HISTORY — DX: Abdominal distension (gaseous): R14.0

## 2015-04-06 HISTORY — DX: Chronic or unspecified gastric ulcer with hemorrhage: K25.4

## 2015-04-06 HISTORY — DX: Pure hypercholesterolemia, unspecified: E78.00

## 2015-04-06 HISTORY — DX: Personal history of other medical treatment: Z92.89

## 2015-04-06 LAB — ACETAMINOPHEN LEVEL: Acetaminophen (Tylenol), Serum: <10 ug/mL — ABNORMAL LOW (ref 10–30)

## 2015-04-06 LAB — APTT: APTT: 30 s (ref 24–37)

## 2015-04-06 LAB — COMPREHENSIVE METABOLIC PANEL
ALBUMIN: 2.7 g/dL — AB (ref 3.5–5.0)
ALK PHOS: 294 U/L — AB (ref 38–126)
ALT: 93 U/L — ABNORMAL HIGH (ref 14–54)
ANION GAP: 8 (ref 5–15)
AST: 159 U/L — ABNORMAL HIGH (ref 15–41)
BUN: 13 mg/dL (ref 6–20)
CALCIUM: 8.6 mg/dL — AB (ref 8.9–10.3)
CO2: 24 mmol/L (ref 22–32)
Chloride: 105 mmol/L (ref 101–111)
Creatinine, Ser: 0.96 mg/dL (ref 0.44–1.00)
GFR calc Af Amer: >60 mL/min (ref >60)
GFR calc non Af Amer: 53 mL/min — ABNORMAL LOW (ref >60)
Glucose, Bld: 92 mg/dL (ref 65–99)
Potassium: 4.5 mmol/L (ref 3.5–5.1)
Sodium: 137 mmol/L (ref 135–145)
Total Bilirubin: 23.5 mg/dL (ref 0.3–1.2)
Total Protein: 6.5 g/dL (ref 6.5–8.1)

## 2015-04-06 LAB — CBC WITH DIFFERENTIAL/PLATELET
BASOS PCT: 0 % (ref 0–1)
Basophils Absolute: 0 10*3/uL (ref 0.0–0.1)
EOS ABS: 0.1 10*3/uL (ref 0.0–0.7)
Eosinophils Relative: 2 % (ref 0–5)
HCT: 40 % (ref 36.0–46.0)
Hemoglobin: 13.7 g/dL (ref 12.0–15.0)
Lymphocytes Relative: 18 % (ref 12–46)
Lymphs Abs: 1.1 10*3/uL (ref 0.7–4.0)
MCH: 31.8 pg (ref 26.0–34.0)
MCHC: 34.3 g/dL (ref 30.0–36.0)
MCV: 92.8 fL (ref 78.0–100.0)
MONOS PCT: 12 % (ref 3–12)
Monocytes Absolute: 0.7 10*3/uL (ref 0.1–1.0)
NEUTROS PCT: 68 % (ref 43–77)
Neutro Abs: 4 10*3/uL (ref 1.7–7.7)
Platelets: 174 10*3/uL (ref 150–400)
RBC: 4.31 MIL/uL (ref 3.87–5.11)
RDW: 16 % — ABNORMAL HIGH (ref 11.5–15.5)
WBC: 6 10*3/uL (ref 4.0–10.5)

## 2015-04-06 LAB — BILIRUBIN, DIRECT: Bilirubin, Direct: 14.8 mg/dL — ABNORMAL HIGH (ref 0.1–0.5)

## 2015-04-06 LAB — BILIRUBIN, FRACTIONATED(TOT/DIR/INDIR)
BILIRUBIN DIRECT: 15.6 mg/dL — AB (ref <=0.2)
BILIRUBIN INDIRECT: 7.6 mg/dL — AB (ref 0.2–1.2)
Total Bilirubin: 23.2 mg/dL — ABNORMAL HIGH (ref 0.2–1.2)

## 2015-04-06 LAB — PROTIME-INR
INR: 1.44 (ref 0.00–1.49)
INR: 1.42 (ref 0.00–1.49)
PROTHROMBIN TIME: 17.7 s — AB (ref 11.6–15.2)
Prothrombin Time: 17.5 seconds — ABNORMAL HIGH (ref 11.6–15.2)

## 2015-04-06 MED ORDER — POLYETHYLENE GLYCOL 3350 17 G PO PACK: 17.0000 g | PACK | Freq: Every day | ORAL | Status: DC | PRN

## 2015-04-06 MED ORDER — LEVOTHYROXINE SODIUM 88 MCG PO TABS
88.0000 ug | ORAL_TABLET | Freq: Every day | ORAL | Status: DC
  Administered 2015-04-07 – 2015-04-15 (×8): 88 ug via ORAL
  Filled 2015-04-06 (×10): qty 1

## 2015-04-06 MED ORDER — ALUM & MAG HYDROXIDE-SIMETH 200-200-20 MG/5ML PO SUSP: 30.0000 mL | Freq: Four times a day (QID) | ORAL | Status: DC | PRN

## 2015-04-06 MED ORDER — HEPARIN SODIUM (PORCINE) 5000 UNIT/ML IJ SOLN
5000.0000 [IU] | Freq: Three times a day (TID) | INTRAMUSCULAR | Status: DC
  Administered 2015-04-06 – 2015-04-08 (×5): 5000 [IU] via SUBCUTANEOUS
  Filled 2015-04-06 (×4): qty 1

## 2015-04-06 MED ORDER — ONDANSETRON HCL 4 MG/2ML IJ SOLN
4.0000 mg | Freq: Four times a day (QID) | INTRAMUSCULAR | Status: DC | PRN
  Administered 2015-04-11: 4 mg via INTRAVENOUS
  Filled 2015-04-06: qty 2

## 2015-04-06 MED ORDER — METOPROLOL TARTRATE 12.5 MG HALF TABLET
12.5000 mg | ORAL_TABLET | Freq: Two times a day (BID) | ORAL | Status: DC
  Administered 2015-04-06 – 2015-04-15 (×18): 12.5 mg via ORAL
  Filled 2015-04-06 (×19): qty 1

## 2015-04-06 MED ORDER — ONDANSETRON HCL 4 MG PO TABS: 4.0000 mg | ORAL_TABLET | Freq: Three times a day (TID) | ORAL | Status: DC | PRN

## 2015-04-06 MED ORDER — ONDANSETRON HCL 4 MG PO TABS: 4.0000 mg | ORAL_TABLET | Freq: Four times a day (QID) | ORAL | Status: DC | PRN

## 2015-04-06 NOTE — Progress Notes (Signed)
Chief complaint: patient has turned yellow, yellow skin, eyes, nausea, poor appetite, abdominal distention   Subjective:    Patient ID: Diana Pearson, female    DOB: 03/16/1929, 81 y.o.   MRN: [id number]  HPI  81 year old with chronic hepatitis C without coma and with cirrhosis. She started zepatier 20 days ago. 10 days ago developed jaundice, icterus, nausea, no vomiting, poor appetite, along with abdominal pain. She has ate much less since she developed jaundice and nausea. Her daughter had called clinic but patient had not come in for labs until today. When she was here my staff was alarmed by how profoundly jaundiced she was. I discussed with Dr. Linus Salmons and worked her in with me in clinic urgently.  Past Medical History  Diagnosis Date  . Hypothyroidism   . Osteoporosis   . Abdominal bloating   . Atrial fibrillation   . Hepatitis     C   Past Surgical History  Procedure Laterality Date  . Right hip fracture     Family History  Problem Relation Age of Onset  . Stroke Mother    Social History  Substance Use Topics  . Smoking status: Former Research scientist (life sciences)  . Smokeless tobacco: Never Used  . Alcohol Use: No    Current outpatient prescriptions:  .  Elbasvir-Grazoprevir (ZEPATIER) 50-100 MG TABS, Take 1 tablet by mouth daily., Disp: 28 tablet, Rfl: 2 .  levothyroxine (SYNTHROID, LEVOTHROID) 88 MCG tablet, Take 88 mcg by mouth daily before breakfast., Disp: , Rfl:  .  metoprolol tartrate (LOPRESSOR) 25 MG tablet, Take 0.5 tablets (12.5 mg total) by mouth 2 (two) times daily., Disp: 45 tablet, Rfl: 3 .  ondansetron (ZOFRAN) 4 MG tablet, Take 1 tablet (4 mg total) by mouth every 8 (eight) hours as needed for nausea or vomiting., Disp: 20 tablet, Rfl: 1 .  pravastatin (PRAVACHOL) 20 MG tablet, Take 1 tablet (20 mg total) by mouth daily., Disp: 90 tablet, Rfl: 3  No Known Allergies    Review of Systems  Constitutional: Positive for activity change, appetite change and fatigue.  Negative for fever, chills, diaphoresis and unexpected weight change.  HENT: Negative for congestion, rhinorrhea, sinus pressure, sneezing, sore throat and trouble swallowing.   Eyes: Negative for photophobia and visual disturbance.  Respiratory: Negative for cough, chest tightness, shortness of breath, wheezing and stridor.   Cardiovascular: Negative for chest pain, palpitations and leg swelling.  Gastrointestinal: Positive for nausea, abdominal pain and abdominal distention. Negative for vomiting, diarrhea, constipation, blood in stool and anal bleeding.  Genitourinary: Negative for dysuria, hematuria, flank pain and difficulty urinating.  Musculoskeletal: Negative for myalgias, back pain, joint swelling, arthralgias and gait problem.  Skin: Positive for color change and pallor. Negative for rash and wound.  Neurological: Positive for weakness and light-headedness. Negative for dizziness and tremors.  Hematological: Negative for adenopathy. Does not bruise/bleed easily.  Psychiatric/Behavioral: Negative for behavioral problems, confusion, sleep disturbance, dysphoric mood, decreased concentration and agitation.       Objective:   Physical Exam  Constitutional: She is oriented to person, place, and time. She appears well-developed and well-nourished. No distress.  HENT:  Head: Normocephalic and atraumatic.  Mouth/Throat: No oropharyngeal exudate.  Eyes: Conjunctivae and EOM are normal. Scleral icterus is present.  Neck: Normal range of motion. Neck supple.  Cardiovascular: Normal rate, regular rhythm, normal heart sounds and intact distal pulses.  Exam reveals no gallop and no friction rub.   No murmur heard. Pulmonary/Chest: Effort normal and breath  sounds normal. No respiratory distress. She has no wheezes. She has no rales. She exhibits no tenderness.  Abdominal: Soft. She exhibits distension. She exhibits no mass. There is tenderness in the suprapubic area, left upper quadrant and left  lower quadrant.  Pt with dullness to percussion on flanks and pain in LLQ with percussion  She was not comfortable enough for me to palpate spleen or liver  Musculoskeletal: She exhibits edema. She exhibits no tenderness.  Neurological: She is alert and oriented to person, place, and time. No sensory deficit. She exhibits normal muscle tone. Coordination normal.  Skin: Skin is warm and dry. No rash noted. She is not diaphoretic. No erythema.  jaundiced  Psychiatric: She has a normal mood and affect. Her behavior is normal. Judgment and thought content normal.  Nursing note and vitals reviewed.   Icterus and Jaundice 04/05/15:            Assessment & Plan:   #1 Acute onset of jaundice, icterus, nausea, abdominal pain and distention with possible ascites:  My concern is that she could have in worst case scenario fulminant liver failure. Mild bilirubinemia reported with zepatier but this degree of jaundice likely corresponds with [facility] higher levels and I worry again about signficant liver injury. Certainly gallstones, other biliary pathology   I ordered STAT CMP, CBC c diff and she was also having her HCV VL checked  I am going to admit her to the hospital and Dr. Ree Kida agreed to have her admitted to [facility]  I think she clearly should be seen by GI/Hepatology today  Zepatier needs to be stopped for now  I will also let Dr. Johnnye Sima know about the patient so he can follow her tomorrow in the hospital  I would also make sure to check coags, conj, unconj bilirubin RUQ [place]  #2 Chronic hepatitis c without coma and with cirrhosis: I suspect that we are going to have to abandon zepatier (but we will see) and then potentially revisit therapy int he future. Again need to see what is going on right now first  I spent greater than 40 minutes with the patient including greater than 50% of time in face to face counsel of the patient and daughter and in coordination of their care with pharmacy,  my partner Dr. Linus Salmons and with admitting team.

## 2015-04-06 NOTE — Progress Notes (Signed)
Received patient from previous RN at approx 1730. Patient is VERY jaundiced, but does not appear to be in any distress. Daughter at bedside. Reminded patient and patient's daughter that she needs to have nothing by mouth until after her abdominal u/s is completed. Admission RN had been notified by previous RN of patient's arrival to unit. Daughter denies that patient has fallen at all at home.   Joellen Jersey, RN.

## 2015-04-06 NOTE — Consult Note (Signed)
[facility] Consult: 2:45 PM 04/06/2015  LOS: 0 days    Referring Provider: Dr Grandville Silos Primary Care Physician:  No PCP Per Patient Primary Gastroenterologist:  Dr. Fuller Plan seen in 11/2014, 1 visit only.    Reason for Consultation:  New onset Jaundice   HPI: Diana Pearson is a 81 y.o. female.  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 She does not speak [place], only Arabic. She has been in [place] since 03/03/2015 but travels regularly back to [place].  Severe Aortic stenosis. Hypothyroidism.  A fib, no anticoagulation (Dr Harrington Challenger says will not start until pt has GI evaluation). Osteoporosis, thoraco lumbar DDD with compression deformities. Ovarian cyst. Ulcer disease with hematemesis in 2006, not on PPI currently. .    Hepatitis C (needle stick exposure 1970s) diagnosed in [place] ~ 2006.  Hx elevated LFTs. 2x normal range 04/2014. Platelets 108 K.  04/2014 ultrasound in [facility] for abd swelling.  Tiny liver cysts and ? Calcified liver granuloma. Some windows suggested liver nodularity.  Normal hepatopedal flow.  11/2014 CT abd/pelvis to follow up ultrasound and assess for cirrhosis.  Nodular liver, increased PV caliber suff of PV htn. Mild, non-specific pericholecystic edema.  No stones or sludge. Biliary ducts normal. Aortic atherosclerosis.  Never followed up with Dr Fuller Plan after initial [place] in 11/2014  Hep C viral count [id number] on 12/13/14.  Hep B surface Ab and Hep B core Ab +, but surface AG negative.  Hep A total Ab reactive.  HIV nonreactive.  Patient was started on Zepatier, for treatment of her hep C, 20 days ago, by Dr Linus Salmons.    ~ day 10 of therapy, developed jaundice, anorexia, chills sweats. She has nausea but no emesis. She denies icterus. There's  been some abdominal swelling but today's weight reflects a 5 pound drop compared with 4 weeks ago.  A few days ago she was having some epigastric discomfort but this has resolved. She denies severe abdominal pain.  No alcohol. There is no mention of her taking excessive acetaminophen Dr Bonnita Hollow of ID saw pt this AM and profoundly jaundiced.  Sent for direct admission.   T Bili 23 K, was 0.6 on 10/05/14. Alk phos 294, previously 54.  AST/ALT 159/93, c/w 79/85.  Platelets 174, 110 in 09/2014. PT/INR 11/1.1    Past Medical History  Diagnosis Date  . Hypothyroidism   . Osteoporosis   . Atrial fibrillation     no anticoagulation.   . Hepatitis C ~ 2006    needle stick in 1970s  . Jaundice 04/06/2015    Sudden onset, 10 days following initiation of Zepatier for Hep C.   . Abdominal distention 04/06/2015  . Aortic stenosis 10/2014    by echo.     Past Surgical History  Procedure Laterality Date  . Right hip fracture      Prior to Admission medications   Medication Sig Start Date End Date Taking? Authorizing Provider  Elbasvir-Grazoprevir (ZEPATIER) 50-100 MG TABS Take 1 tablet by  mouth daily. 03/10/15   Carlyle Basques, MD  levothyroxine (SYNTHROID, LEVOTHROID) 88 MCG tablet Take 88 mcg by mouth daily before breakfast.    Historical Provider, MD  metoprolol tartrate (LOPRESSOR) 25 MG tablet Take 0.5 tablets (12.5 mg total) by mouth 2 (two) times daily. 10/11/14   Fay Records, MD  ondansetron (ZOFRAN) 4 MG tablet Take 1 tablet (4 mg total) by mouth every 8 (eight) hours as needed for nausea or vomiting. 03/24/15   Thayer Headings, MD  pravastatin (PRAVACHOL) 20 MG tablet Take 1 tablet (20 mg total) by mouth daily. 10/11/14   Fay Records, MD    Scheduled Meds:  Infusions:  PRN Meds:    Allergies as of 04/06/2015  . (No Known Allergies)    Family History  Problem Relation Age of Onset  . Stroke Mother     Social History   Social History  . Marital Status: Widowed    Spouse Name:  N/A  . Number of Children: N/A  . Years of Education: N/A   Occupational History  . Not on file.   Social History Main Topics  . Smoking status: Former Research scientist (life sciences)  . Smokeless tobacco: Never Used     Comment: quit many years ago  . Alcohol Use: No  . Drug Use: No  . Sexual Activity: No   Other Topics Concern  . Not on file   Social History Narrative    REVIEW OF SYSTEMS: Constitutional:  Increased fatigue but no profound weakness. No falls ENT:  No nose bleeds, no congestion Pulm:  No shortness of breath, no cough, no pleuritic pain. CV:  No palpitations, no LE edema.  No chest pain  GU:  No hematuria, no frequency GI:  No dysphagia.  Stools are described as "yellow" Heme:  No issues with excessive bleeding or bruising   Transfusions:  No history of transfusions, forgot to ask if she ever been transfused before the translator signed off. Neuro:  No headaches, no peripheral tingling or numbness Derm:  No itching, no rash or sores.  Endocrine:  No sweats or chills.  No polyuria or dysuria Immunization:  Not queried.  A Pneumovax documented 02/2015 Travel:  Travels from the [place] to [place] with some regularity..    PHYSICAL EXAM: Vital signs in last 24 hours: Filed Vitals:   04/06/15 1331  BP: 112/52  Pulse: 61  Temp: 98.4 F (36.9 C)  Resp: 16   Wt Readings from Last 3 Encounters:  04/06/15 113 lb (51.256 kg)  03/10/15 118 lb 4.8 oz (53.661 kg)  03/09/15 118 lb (53.524 kg)    General: Jaundiced, pleasant, comfortable. Looks younger than stated age. Head:  No swelling, no asymmetry.  Eyes:  Icteric sclera. Conjunctiva pink. Ears:  No obvious hearing deficit  Nose:  No congestion or discharge. Mouth:  Moist, clear, pink oral mucosa. Good dentition. Neck:  No JVD, no TMG, no masses Lungs:  Clear bilaterally. No cough or dyspnea. Heart: RRR with 3/6 harsh systolic murmur. Abdomen:  Soft, not tender, not distended, no obvious fluid wave/ascites. No HSM. Bowel sounds  active..   Rectal: Deferred   Musc/Skeltl: No joint swelling. Slight arthritic deformity in the hands. Extremities:  No CCE.  Neurologic:  No tremor, no asterixis. Oriented 3. Fully alert without any somnolence. Skin:  Jaundiced. No spider angiomata. Tattoos:  None Nodes:  No cervical or inguinal adenopathy.   Psych:  Cooperative, calm, in good spirits.  Intake/Output from previous day:   Intake/Output  this shift:    LAB RESULTS:  Recent Labs  04/06/15 1015  WBC 6.0  HGB 13.7  HCT 40.0  PLT 174   BMET Lab Results  Component Value Date   NA 137 04/06/2015   NA 143 09/20/2014   K 4.5 04/06/2015   K 4.1 09/20/2014   CL 105 04/06/2015   CL 107 09/20/2014   CO2 24 04/06/2015   CO2 29 09/20/2014   GLUCOSE 92 04/06/2015   GLUCOSE 76 09/20/2014   BUN 13 04/06/2015   BUN 17 12/20/2014   BUN 16 09/20/2014   CREATININE 0.96 04/06/2015   CREATININE 0.71 12/20/2014   CREATININE 0.57 09/20/2014   CALCIUM 8.6* 04/06/2015   CALCIUM 9.8 09/20/2014   LFT  Recent Labs  04/06/15 1015  PROT 6.5  ALBUMIN 2.7*  AST 159*  ALT 93*  ALKPHOS 294*  BILITOT 23.5*   PT/INR Lab Results  Component Value Date   INR 1.1* 12/13/2014   Hepatitis Panel No results for input(s): HEPBSAG, HCVAB, HEPAIGM, HEPBIGM in the last 72 hours.  RADIOLOGY STUDIES: No results found.  ENDOSCOPIC STUDIES: ?.  Did not inquire as to past endoscopies  Before translator line disconnected.   IMPRESSION:   *  New onset jaundice in pt with Hep C cirrhosis who started Hep C tx with Zepatier 20 days ago, onset of jaundice within 10 days of drug initiation. ? DILI, ? Decompensated cirrhosis, ? Decompensated aortic valve stenosis with right heart failure, ? HCCA.  *  Severe Aortic stenosis. Latest echo 10/2014: 60 to 65% EF, severe aortic valve stenosis with mild to moderate regurge, slight increase pulmonary pressures.   No acute sxs to suggest decompensation.   *  Afib, not on anticoagulation. With  admission she has been started on DVT prophylaxis subq heparin.  *  PUD with hematemesis 2006.  Endoscopy then.  No PPI currently.      PLAN:     *  Abdominal ultrasound and Doppler studies.  Should go down for this within the hour. Has been NPO.    *  2 D echo ordered. coags this evening and in AM. Acute hepatitis serologies, EBV IgM, Hep E IgM, HSV,   *  Ok to eat HH diet as tolerated once ultrasound completed.   *  Hold Pravastatin as you have done.    Azucena Freed  04/06/2015, 2:45 PM Pager: [phone]   Attending Addendum: I have taken an interval history, reviewed the chart, and examined the patient. I agree with the Advanced Practitioner's note and impression. 81 y/o female with Ardine Eng class A cirrhosis due to chronic HCV, recently started therapy with Zepatier for HCV roughly 20 days ago, now symptomatic for the past 10 days with fatigue/malaise and development of jaundice. Profound hyperbilirubinemia with only mild elevation in ALT/AST. INR at 1.4,  up from baseline of 1.1. She is mentating normally and has no asterixis or evidence of encephalopathy. She denies any other new medications, supplements, or herbals. Recent travel to [place].   Given the time course of her symptoms and Zepatier use, drug induced liver injury from this is high on the differential however prior reports of Zepatier drug induced liver injury (from Liver tox database) usually present with a hepatocellular pattern and not cholestatic. We are awaiting RUQ [place] to assess the biliary tree and rule out obstructive process, HCC, etc. We will also evaluate for acute viral etiologies. She has had a prior workup for other chronic liver diseases which was unremarkable  other than mild positive AMA. Patient otherwise denies other supplements/herbals, given her med list Zepatier would be the most likely to cause a liver injury/decompensation.  At this time will monitor coags closely and perform neuro checks frequently to assess  mental status and monitor for evidence of liver failure, although patient and daughter report she is actually feeling improved today compared to yesterday. Last dose of Zepatier was yesterday. I discussed my impression with the patient and daughter who agreed. Hold other outpatient meds.  Please call us with any significant findings on RUQ [place] or change in mental status / coags overnight, or if other questions arise. Repeat LFTs in the AM.  [place] Cellar, MD [facility] Pager [phone]

## 2015-04-06 NOTE — Progress Notes (Addendum)
Ultrasound called this RN to ask about specific details regarding abdominal ultrasound and arterial/venous dopplers of abd/pelvis. Dr. Havery Moros paged; awaiting return phone call.  Joellen Jersey, RN.   [phone] Addendum - Dr. Havery Moros stated he would like both studies completed on patient. Upon talking to staff in ultrasound they stated they wouldn't have anyone available to do the dopplers until tomorrow morning.  [place], PA also called this RN at this time and stated she was ok with having the dopplers done tomorrow but would really need the complete abd u/s done this evening. Ultrasound staff informed.   Joellen Jersey, RN.

## 2015-04-06 NOTE — H&P (Signed)
[facility] History and Physical                                                                                    Diana Pearson, is a 81 y.o. female  MRN: [id number]   DOB - 07/12/1929  Admit Date - 04/06/2015  Outpatient Primary MD for the patient is No PCP Per Patient  Referring Physician:    Chief Complaint:     HPI  History was gathered with the aid of a [place]  is a 81 y.o. female from [place] who speaks only Arabic, with atrial fibrillation, hep C, cirrhosis, aortic stenosis, hypothyroidism who presented to the infectious disease office today with severe nausea and jaundice. She was sent for direct admission by Dr. Drucilla Schmidt.  The patient was started on treatment for hepatitis C with Zepatier approximately 20 days ago.  10 days ago the patient began to notice yellowing of her skin.  Over the last several days she has become very nauseated but has not vomited.  She denies abdominal pain, diarrhea, vomiting.    The patient visits frequently from [place] and stays with her daughter for 1 month and then returns.  She does not drink alcohol. There is no hx of liver or gall bladder disease in her immediate family.   She broke her right leg 1 year ago, but walks independently.    Review of Systems   In addition to the HPI above, ++Nausea. No Fever-chills, No Headache, No changes with Vision or hearing, No problems swallowing food or Liquids, No Chest pain, Cough or Shortness of Breath, No Abdominal pain, No  Vomiting, Bowel movements are regular, No Blood in stool or Urine, No dysuria,  ++ Urine is darkened. No new skin rashes or bruises, No new joints pains-aches,  No new weakness, tingling, numbness in any extremity, No recent weight gain or loss, A full 10 point Review of Systems was done, except as stated above, all other Review of Systems were negative.  Past Medical History  Past Medical History  Diagnosis Date  . Hypothyroidism   . Osteoporosis    . Atrial fibrillation     no anticoagulation.   . Hepatitis C ~ 2006    needle stick in 1970s  . Jaundice 04/06/2015    Sudden onset, 10 days following initiation of Zepatier for Hep C.   . Abdominal distention 04/06/2015  . Aortic stenosis 10/2014    by echo.     Past Surgical History  Procedure Laterality Date  . Right hip fracture      Social History Social History  Substance Use Topics  . Smoking status: Former Research scientist (life sciences)  . Smokeless tobacco: Never Used     Comment: quit many years ago  . Alcohol Use: No  From [place] visiting her daughter.  Walks independently.  Widowed.  Family History Family History  Problem Relation Age of Onset  . Stroke Mother   No known hx of liver disease in the family.  Prior to Admission medications   Medication Sig Start Date End Date Taking? Authorizing Provider  Elbasvir-Grazoprevir (ZEPATIER) 50-100 MG TABS Take 1 tablet by mouth daily. 03/10/15  Carlyle Basques, MD  levothyroxine (SYNTHROID, LEVOTHROID) 88 MCG tablet Take 88 mcg by mouth daily before breakfast.    Historical Provider, MD  metoprolol tartrate (LOPRESSOR) 25 MG tablet Take 0.5 tablets (12.5 mg total) by mouth 2 (two) times daily. 10/11/14   Fay Records, MD  ondansetron (ZOFRAN) 4 MG tablet Take 1 tablet (4 mg total) by mouth every 8 (eight) hours as needed for nausea or vomiting. 03/24/15   Thayer Headings, MD  pravastatin (PRAVACHOL) 20 MG tablet Take 1 tablet (20 mg total) by mouth daily. 10/11/14   Fay Records, MD    No Known Allergies  Physical Exam  Vitals  Blood pressure 112/52, pulse 61, temperature 98.4 F (36.9 C), temperature source Oral, resp. rate 16, SpO2 96 %.   General:  Well developed elderly female, lying in bed in NAD, appear younger than 83.  ++ Icterus and Jaundice.  Psych:  Normal affect and insight, Not Suicidal or Homicidal, Awake Alert, Oriented X 3.  Neuro:   No F.N deficits, ALL C.Nerves Intact  ENT:  Ears and Eyes appear Normal, Conjunctivae  clear, PER. Moist oral mucosa without erythema or exudates. Yellowed oral mucosa  Neck:  Supple, No lymphadenopathy appreciated  Respiratory:  Symmetrical chest wall movement, Good air movement bilaterally, CTAB.  Cardiac: Bradycardic, +6/4 systolic murmur, no r/g, 1+ LE edema bilaterally, no JVD.  Abdomen:  Positive bowel sounds, Soft, Non tender, Non distended,  No masses appreciated  Skin:  No Cyanosis, Normal Skin Turgor, No Skin Rash or Bruise.  +++ Jaundice  Extremities:  Able to move all 4. 5/5 strength in each,  no effusions.  Data Review  CBC  Recent Labs Lab 04/06/15 1015  WBC 6.0  HGB 13.7  HCT 40.0  PLT 174  MCV 92.8  MCH 31.8  MCHC 34.3  RDW 16.0*  LYMPHSABS 1.1  MONOABS 0.7  EOSABS 0.1  BASOSABS 0.0    Chemistries   Recent Labs Lab 04/06/15 1015  NA 137  K 4.5  CL 105  CO2 24  GLUCOSE 92  BUN 13  CREATININE 0.96  CALCIUM 8.6*  AST 159*  ALT 93*  ALKPHOS 294*  BILITOT 23.5*     Coagulation profile  Recent Labs Lab 04/06/15 1405  INR 1.44     Imaging results:   My personal review of EKG: Was bradycardic on previous EKG in 08/2014.  Will check EKG on admit today.   Assessment & Plan  Principal Problem:   Acute liver failure Active Problems:   Chronic hepatitis C without hepatic coma   Hepatic cirrhosis   Hypertension   Hypothyroidism   Jaundice   Acute liver failure (total bilirubin 23) with elevated PT, versus possible obstructive Jaundice. Patient has a history of hep C cirrhosis presumably caused by a needle stick some 10 years ago. She has no other risk factors. She is followed by our infectious disease clinic. She was started on Zepatier approximately 20 days ago by Dr. Linus Salmons. She presented to the ID clinic today with severe jaundice and was directly admitted to the hospital.  Her prothrombin time was 11.7 on 5/16 and is 17.7 on 9/7. Her bilirubin is 23, Alk phos 294, ast 159, alt 93.  All acutely elevated since  10/05/14. [facility] consultation and workup. Abdominal ultrasound, coags, LFTs ordered. We will hold her pravastatin and any other hepatotoxic medications.  Hypertension Well-controlled with metoprolol.  Hypothyroidism Continue treatment with levothyroxine  Hyperlipidemia Will stop statin at  this point due to acute liver failure.  Atrial fibrillation Rate controlled with metoprolol. Not on anticoagulation. She is followed by Dr. Dorris Carnes.  Aortic stenosis. Recent echo 4/16 shows severe stenosis of the aortic valve and mildly increased systolic pressure. No wall motion abnormalities. LV EF was 60-65%.  Will be careful with IVF.   Consultants Called:  [facility]  Family Communication:   Talked with patient's dtr, Diana Pearson  Code Status:  Full. This was confirmed with the patient and her daughter Diana Pearson  Condition:  Guarded  Potential Disposition: To home when okay with GI.  PT evaluation requested  Time spent in minutes : [place],  PA-C on 04/06/2015 at 4:50 PM Between 7am to 7pm - Pager - [phone] After 7pm go to [email or web address] - password [email or web address] And look for the night coverage person covering me after hours  [facility]

## 2015-04-07 ENCOUNTER — Inpatient Hospital Stay (HOSPITAL_COMMUNITY): Payer: Medicaid Other

## 2015-04-07 ENCOUNTER — Observation Stay (HOSPITAL_COMMUNITY): Payer: Medicaid Other

## 2015-04-07 DIAGNOSIS — K7200 Acute and subacute hepatic failure without coma: Secondary | ICD-10-CM | POA: Insufficient documentation

## 2015-04-07 DIAGNOSIS — B182 Chronic viral hepatitis C: Secondary | ICD-10-CM

## 2015-04-07 DIAGNOSIS — R17 Unspecified jaundice: Secondary | ICD-10-CM

## 2015-04-07 DIAGNOSIS — K7469 Other cirrhosis of liver: Secondary | ICD-10-CM

## 2015-04-07 LAB — HEPATIC FUNCTION PANEL
ALK PHOS: 245 U/L — AB (ref 38–126)
ALT: 71 U/L — ABNORMAL HIGH (ref 14–54)
AST: 122 U/L — ABNORMAL HIGH (ref 15–41)
Albumin: 2.1 g/dL — ABNORMAL LOW (ref 3.5–5.0)
BILIRUBIN INDIRECT: 5.2 mg/dL — AB (ref 0.3–0.9)
BILIRUBIN TOTAL: 20 mg/dL — AB (ref 0.3–1.2)
Bilirubin, Direct: 14.8 mg/dL — ABNORMAL HIGH (ref 0.1–0.5)
Total Protein: 6 g/dL — ABNORMAL LOW (ref 6.5–8.1)

## 2015-04-07 LAB — HEPATITIS PANEL, ACUTE
HCV Ab: >11 s/co ratio — ABNORMAL HIGH (ref 0.0–0.9)
HEP A IGM: NEGATIVE
Hep B C IgM: NEGATIVE
Hepatitis B Surface Ag: NEGATIVE

## 2015-04-07 LAB — PROTIME-INR
INR: 1.47 (ref 0.00–1.49)
PROTHROMBIN TIME: 17.9 s — AB (ref 11.6–15.2)

## 2015-04-07 LAB — CBC
HEMATOCRIT: 36.1 % (ref 36.0–46.0)
HEMOGLOBIN: 12.2 g/dL (ref 12.0–15.0)
MCH: 30.7 pg (ref 26.0–34.0)
MCHC: 33.8 g/dL (ref 30.0–36.0)
MCV: 90.7 fL (ref 78.0–100.0)
Platelets: 155 10*3/uL (ref 150–400)
RBC: 3.98 MIL/uL (ref 3.87–5.11)
RDW: 16.5 % — ABNORMAL HIGH (ref 11.5–15.5)
WBC: 4.9 10*3/uL (ref 4.0–10.5)

## 2015-04-07 LAB — SAVE SMEAR

## 2015-04-07 LAB — BASIC METABOLIC PANEL
ANION GAP: 7 (ref 5–15)
BUN: 12 mg/dL (ref 6–20)
CALCIUM: 8.3 mg/dL — AB (ref 8.9–10.3)
CO2: 25 mmol/L (ref 22–32)
Chloride: 106 mmol/L (ref 101–111)
Creatinine, Ser: 0.91 mg/dL (ref 0.44–1.00)
GFR, EST NON AFRICAN AMERICAN: 57 mL/min — AB (ref >60)
GLUCOSE: 127 mg/dL — AB (ref 65–99)
POTASSIUM: 4.3 mmol/L (ref 3.5–5.1)
Sodium: 138 mmol/L (ref 135–145)

## 2015-04-07 LAB — EPSTEIN-BARR VIRUS VCA, IGM: EBV VCA IgM: <36 U/mL (ref 0.0–35.9)

## 2015-04-07 LAB — HEPATITIS B SURFACE ANTIGEN: Hepatitis B Surface Ag: NEGATIVE

## 2015-04-07 MED ORDER — IOHEXOL 300 MG/ML  SOLN
25.0000 mL | INTRAMUSCULAR | Status: AC
  Administered 2015-04-07 (×2): 25 mL via ORAL

## 2015-04-07 MED ORDER — IOHEXOL 300 MG/ML  SOLN
100.0000 mL | Freq: Once | INTRAMUSCULAR | Status: AC | PRN
  Administered 2015-04-07: 100 mL via INTRAVENOUS

## 2015-04-07 NOTE — Progress Notes (Signed)
Utilization review completed. Geofrey Silliman, RN, BSN. 

## 2015-04-07 NOTE — Progress Notes (Signed)
INFECTIOUS DISEASE PROGRESS NOTE  ID: Diana Pearson is a 81 y.o. female with  Principal Problem:   Acute liver failure Active Problems:   Chronic hepatitis C without hepatic coma   Hepatic cirrhosis   Hypertension   Hypothyroidism   Jaundice   Cirrhosis   Liver failure, acute  Subjective: 81 yo F with Hepatitis C who was started on zepatier on 8-18. By ~8-28 she developed jaundice, icterus, anorexia and abd pain.  She was seen in ID clinic on 9-7 and adm for acute liver failure. Her zepatier was stopped.  Her T bil was 23, ast/alt 159/93. INR 1.1.  She was afebrile, WBC was 6.0.  She had RUQ u/s showing CBD with mild intrahepatic dilation. ?portal vein thrombosis.  Awakens easily, without complaints.   Abtx:  Anti-infectives    None      Medications:  Scheduled: . heparin  5,000 Units Subcutaneous 3 times per day  . levothyroxine  88 mcg Oral QAC breakfast  . metoprolol tartrate  12.5 mg Oral BID    Objective: Vital signs in last 24 hours: Temp:  [97.6 F (36.4 C)-98.1 F (36.7 C)] 98.1 F (36.7 C) (09/08 0928) Pulse Rate:  [57-62] 59 (09/08 0928) Resp:  [16-18] 16 (09/08 0928) BP: (117-118)/(53-72) 117/54 mmHg (09/08 0928) SpO2:  [97 %-98 %] 98 % (09/08 0928) Weight:  [52.7 kg (116 lb 2.9 oz)] 52.7 kg (116 lb 2.9 oz) (09/07 2020)   General appearance: alert, cooperative and no distress Eyes: positive findings: sclera icterus, severe Resp: clear to auscultation bilaterally Cardio: regular rate and rhythm GI: normal findings: bowel sounds normal, soft, non-tender and mild distension Skin: icterus, severe  Lab Results  Recent Labs  04/06/15 1015 04/07/15 0523  WBC 6.0 4.9  HGB 13.7 12.2  HCT 40.0 36.1  NA 137 138  K 4.5 4.3  CL 105 106  CO2 24 25  BUN 13 12  CREATININE 0.96 0.91   Liver Panel  Recent Labs  04/06/15 1015  04/06/15 1124 04/06/15 2034 04/07/15 0523  PROT 6.5  --   --   --  6.0*  ALBUMIN 2.7*  --   --   --  2.1*  AST 159*   --   --   --  122*  ALT 93*  --   --   --  71*  ALKPHOS 294*  --   --   --  245*  BILITOT 23.5*  --  23.2*  --  20.0*  BILIDIR  --   < > 15.6* 14.8* 14.8*  IBILI  --   --  7.6*  --  5.2*  < > = values in this interval not displayed. Sedimentation Rate No results for input(s): ESRSEDRATE in the last 72 hours. C-Reactive Protein No results for input(s): CRP in the last 72 hours.  Microbiology: No results found for this or any previous visit (from the past 240 hour(s)).  Studies/Results: US Abdomen Complete  04/06/2015   CLINICAL DATA:  Acute liver failure.  Jaundice.  EXAM: ULTRASOUND ABDOMEN COMPLETE  COMPARISON:  None.  FINDINGS: Gallbladder: Gallbladder is not visualized and may be surgically absent or contracted.  Common bile duct: Diameter: 5.3 mm, normal  Liver: Coarsened parenchymal echotexture with nodular contour suggesting hepatic cirrhosis. Prominence of portal veins with flow in the appropriate direction. Suggestion of mild intrahepatic bile duct dilatation. No focal liver lesions identified.  IVC: No abnormality visualized.  Pancreas: Visualized portion unremarkable.  Spleen: Spleen appears enlarged with homogeneous  parenchymal echotexture.  Right Kidney: Length: 10.3 cm. Echogenicity within normal limits. No mass or hydronephrosis visualized.  Left Kidney: Length: 9.6 cm. Small cyst demonstrated in the lower pole measuring up to about 1 cm diameter. No hydronephrosis.  Abdominal aorta: No aneurysm visualized.  Other findings: None.  IMPRESSION: Coarsened liver echotexture with nodular contour and prominent pulmonary venous structures consistent with hepatic cirrhosis. No focal lesions identified. Suggestion of intrahepatic bile duct dilatation without extrahepatic ductal dilatation. Gallbladder not visualized. Probable splenic enlargement.   Electronically Signed   By: Lucienne Capers M.D.   On: 04/06/2015 23:51   [place] Art/ven Flow Abd Pelv Doppler  04/07/2015   CLINICAL DATA:  Jaundice,  hepatic cirrhosis.  EXAM: DUPLEX ULTRASOUND OF LIVER  TECHNIQUE: Color and duplex Doppler ultrasound was performed to evaluate the hepatic in-flow and out-flow vessels. Exam is significantly limited as patient does not speak English and unable to follow breathing instructions.  COMPARISON:  None.  FINDINGS: Portal Vein Velocities  Main: 18.1 cm/sec proximally ; hepatopetal flow is noted proximally and in the midportion, but no flow is noted distally consistent with portal venous thrombus.  Right:  Not visualized due to limitations described above.  Left:  Not visualized due to limitations described above.  Hepatic Vein Velocities  Right:  20.5 cm/sec ; hepatofugal flow is noted.  Middle:  Not visualized due to limitations described above.  Left:  29.7 Cm/sec ; hepatofugal flow is noted.  Hepatic Artery Velocity:  146 cm/sec  Splenic Vein Velocity:  17.1 cm/sec  Varices: Present.  Ascites: Present.  IMPRESSION: Exam is significantly limited as patient does not speak English and unable to follow breathing instructions. There is probable occlusive thrombus seen in the distal portal vein. Middle hepatic vein is not visualized due to previously described limitations, but right and left hepatic veins demonstrate normal flow and velocity. These results will be called to the ordering clinician or representative by the Radiologist Assistant, and communication documented in the PACS or zVision Dashboard.   Electronically Signed   By: Marijo Conception, M.D.   On: 04/07/2015 08:12     Assessment/Plan: Cholestatic Jaundice  Await MRI/CT of liver  Await acute hepatitis panel  Await Hep E ab  Await HSV,  EBV ab  Add CMV serologies  Hepatitis C  Hold zepatier  Total days of antibiotics: none  Would expect her transaminases to be more elevated if this were superinfection with A/E, CMV, EBV.         Bobby Rumpf Infectious Diseases (pager) [phone] [email or web address] 04/07/2015, 2:55 PM  LOS: 1 day

## 2015-04-07 NOTE — Evaluation (Signed)
Occupational Therapy Evaluation Patient Details Name: Diana Pearson MRN: [id number] DOB: Sep 16, 1928 Today's Date: 04/07/2015    History of Present Illness Pt admitted with new onset jaundice with hep C and cirrhosis. PMH: severe aortic stenosis, afib, DDD, HTN.   Clinical Impression   [employer] Interpreter Roxy Cedar used to communicate with Arabic speaking pt.  Pt reports she is supervised for showering and otherwise independent in self care and does not walk with a device at baseline.  She is currently functioning at a supervision level and stated she will have 24 hour care of her daughter upon return to her daughter's home at discharge. No OT needs identified, will sign off.    Follow Up Recommendations  No OT follow up    Equipment Recommendations  None recommended by OT    Recommendations for Other Services       Precautions / Restrictions        Mobility Bed Mobility Overal bed mobility: Modified Independent                Transfers Overall transfer level: Needs assistance Equipment used: None Transfers: Sit to/from Stand Sit to Stand: Supervision         General transfer comment: supervision to ambulate in room     Balance Overall balance assessment: Needs assistance   Sitting balance-Leahy Scale: Good       Standing balance-Leahy Scale: Fair                              ADL Overall ADL's : At baseline                                       General ADL Comments: Pt at a set up to supervision level in ADL and ADL transfers.     Vision     Perception     Praxis      Pertinent Vitals/Pain Pain Assessment: No/denies pain (reports her abdomen feels bloated)     Hand Dominance Right   Extremity/Trunk Assessment Upper Extremity Assessment Upper Extremity Assessment: Overall WFL for tasks assessed   Lower Extremity Assessment Lower Extremity Assessment: Defer to PT evaluation       Communication  Communication Communication: Prefers language other than English (Arabic)   Cognition Arousal/Alertness: Awake/alert Behavior During Therapy: WFL for tasks assessed/performed Overall Cognitive Status: Within Functional Limits for tasks assessed                     General Comments       Exercises       Shoulder Instructions      Home Living Family/patient expects to be discharged to:: Private residence Living Arrangements: Children (daughter) Available Help at Discharge: Family;Available 24 hours/day Type of Home: House Home Access: Level entry     Home Layout: Two level;Bed/bath upstairs Alternate Level Stairs-Number of Steps: flight   Bathroom Shower/Tub: Occupational psychologist: Standard     Home Equipment: Cane - single point;Shower seat          Prior Functioning/Environment Level of Independence: Independent;Needs assistance  Gait / Transfers Assistance Needed: ambulates without a device ADL's / Homemaking Assistance Needed: Daughter supervises patient for shower, pt indicates that she cooks        OT Diagnosis:     OT Problem List:  OT Treatment/Interventions:      OT Goals(Current goals can be found in the care plan section)    OT Frequency:     Barriers to D/C:            Co-evaluation PT/OT/SLP Co-Evaluation/Treatment: Yes Reason for Co-Treatment: Other (comment)   OT goals addressed during session: ADL's and self-care      End of Session Nurse Communication: Other (comment) (pt reports having had tingling in her chest earlier)  Activity Tolerance: Patient tolerated treatment well Patient left: in bed;with call bell/phone within reach;with bed alarm set   Time: 4496-7591 OT Time Calculation (min): 21 min Charges:  OT General Charges $OT Visit: 1 Procedure OT Evaluation $Initial OT Evaluation Tier I: 1 Procedure G-Codes: OT G-codes **NOT FOR INPATIENT CLASS** Functional Assessment Tool Used: clinical  judgement Functional Limitation: Self care Self Care Current Status (M3846): At least 1 percent but less than 20 percent impaired, limited or restricted Self Care Discharge Status [phone]): At least 1 percent but less than 20 percent impaired, limited or restricted  Malka So 04/07/2015, 4:21 PM  [phone]

## 2015-04-07 NOTE — Progress Notes (Signed)
[facility] PROGRESS NOTE  Kourtney Terriquez HWE:[id number] DOB: 1929-02-28 DOA: 04/06/2015 PCP: No PCP Per Patient  Assessment/Plan: Cholestatic jaundice -no evidence of obstruction on [place] -suspect related to antiviral/Zepatier started approximately 3 weeks ago -Bili improving, mentation clear, no asterixis, PT/INR stable -Duplex US of liver poor quality, ? Distal portal vein obstruction, CT Abd ordered per GI -appreciate Gi input -statin stopped  -Acute hepatitis panel negative except HCV Ab  Hypertension -stable, continue metoprolol.  Hypothyroidism Continue levothyroxine  Hyperlipidemia  stopped statin  due to acute liver failure.  Atrial fibrillation Rate controlled with metoprolol. Not on anticoagulation. She is followed by Dr. Dorris Carnes.  Aortic stenosis. Recent echo 4/16 shows severe stenosis of the aortic valve and mildly increased systolic pressure. No wall motion abnormalities. LV EF was 60-65%. FU with Cards  Family Communication: Talked with patient's dtr, [name] Code Status: Full. This was confirmed with the patient and her daughter Geoffery Lyons Dispo: home when Bili/LFTs continue to trend down  Consultants: GI ID  HPI/Subjective: Feels better, mild R sided discomfort  Objective: Filed Vitals:   04/07/15 0928  BP: 117/54  Pulse: 59  Temp: 98.1 F (36.7 C)  Resp: 16    Intake/Output Summary (Last 24 hours) at 04/07/15 1145 Last data filed at 04/07/15 0827  Gross per 24 hour  Intake    220 ml  Output    400 ml  Net   -180 ml   Filed Weights   04/06/15 2020  Weight: 52.7 kg (116 lb 2.9 oz)    Exam:   General:  AAOx3, icteric  Cardiovascular: E9F8/BOF, systolic murmur  Respiratory: CTAB  Abdomen: soft, Nt, BS present  Musculoskeletal: no edema c/c   Data Reviewed: Basic Metabolic Panel:  Recent Labs Lab 04/06/15 1015 04/07/15 0523  NA 137 138  K 4.5 4.3  CL 105 106  CO2 24 25  GLUCOSE 92 127*  BUN 13 12  CREATININE 0.96  0.91  CALCIUM 8.6* 8.3*   Liver Function Tests:  Recent Labs Lab 04/06/15 1015 04/06/15 1124 04/07/15 0523  AST 159*  --  122*  ALT 93*  --  71*  ALKPHOS 294*  --  245*  BILITOT 23.5* 23.2* 20.0*  PROT 6.5  --  6.0*  ALBUMIN 2.7*  --  2.1*   No results for input(s): LIPASE, AMYLASE in the last 168 hours. No results for input(s): AMMONIA in the last 168 hours. CBC:  Recent Labs Lab 04/06/15 1015 04/07/15 0523  WBC 6.0 4.9  NEUTROABS 4.0  --   HGB 13.7 12.2  HCT 40.0 36.1  MCV 92.8 90.7  PLT 174 155   Cardiac Enzymes: No results for input(s): CKTOTAL, CKMB, CKMBINDEX, TROPONINI in the last 168 hours. BNP (last 3 results) No results for input(s): BNP in the last 8760 hours.  ProBNP (last 3 results) No results for input(s): PROBNP in the last 8760 hours.  CBG: No results for input(s): GLUCAP in the last 168 hours.  No results found for this or any previous visit (from the past 240 hour(s)).   Studies: US Abdomen Complete  04/06/2015   CLINICAL DATA:  Acute liver failure.  Jaundice.  EXAM: ULTRASOUND ABDOMEN COMPLETE  COMPARISON:  None.  FINDINGS: Gallbladder: Gallbladder is not visualized and may be surgically absent or contracted.  Common bile duct: Diameter: 5.3 mm, normal  Liver: Coarsened parenchymal echotexture with nodular contour suggesting hepatic cirrhosis. Prominence of portal veins with flow in the appropriate direction. Suggestion of mild intrahepatic bile  duct dilatation. No focal liver lesions identified.  IVC: No abnormality visualized.  Pancreas: Visualized portion unremarkable.  Spleen: Spleen appears enlarged with homogeneous parenchymal echotexture.  Right Kidney: Length: 10.3 cm. Echogenicity within normal limits. No mass or hydronephrosis visualized.  Left Kidney: Length: 9.6 cm. Small cyst demonstrated in the lower pole measuring up to about 1 cm diameter. No hydronephrosis.  Abdominal aorta: No aneurysm visualized.  Other findings: None.  IMPRESSION:  Coarsened liver echotexture with nodular contour and prominent pulmonary venous structures consistent with hepatic cirrhosis. No focal lesions identified. Suggestion of intrahepatic bile duct dilatation without extrahepatic ductal dilatation. Gallbladder not visualized. Probable splenic enlargement.   Electronically Signed   By: Lucienne Capers M.D.   On: 04/06/2015 23:51   [place] Art/ven Flow Abd Pelv Doppler  04/07/2015   CLINICAL DATA:  Jaundice, hepatic cirrhosis.  EXAM: DUPLEX ULTRASOUND OF LIVER  TECHNIQUE: Color and duplex Doppler ultrasound was performed to evaluate the hepatic in-flow and out-flow vessels. Exam is significantly limited as patient does not speak English and unable to follow breathing instructions.  COMPARISON:  None.  FINDINGS: Portal Vein Velocities  Main: 18.1 cm/sec proximally ; hepatopetal flow is noted proximally and in the midportion, but no flow is noted distally consistent with portal venous thrombus.  Right:  Not visualized due to limitations described above.  Left:  Not visualized due to limitations described above.  Hepatic Vein Velocities  Right:  20.5 cm/sec ; hepatofugal flow is noted.  Middle:  Not visualized due to limitations described above.  Left:  29.7 Cm/sec ; hepatofugal flow is noted.  Hepatic Artery Velocity:  146 cm/sec  Splenic Vein Velocity:  17.1 cm/sec  Varices: Present.  Ascites: Present.  IMPRESSION: Exam is significantly limited as patient does not speak English and unable to follow breathing instructions. There is probable occlusive thrombus seen in the distal portal vein. Middle hepatic vein is not visualized due to previously described limitations, but right and left hepatic veins demonstrate normal flow and velocity. These results will be called to the ordering clinician or representative by the Radiologist Assistant, and communication documented in the PACS or zVision Dashboard.   Electronically Signed   By: Marijo Conception, M.D.   On: 04/07/2015 08:12     Scheduled Meds: . heparin  5,000 Units Subcutaneous 3 times per day  . levothyroxine  88 mcg Oral QAC breakfast  . metoprolol tartrate  12.5 mg Oral BID   Continuous Infusions:  Antibiotics Given (last 72 hours)    None      Principal Problem:   Acute liver failure Active Problems:   Chronic hepatitis C without hepatic coma   Hepatic cirrhosis   Hypertension   Hypothyroidism   Jaundice   Cirrhosis    Time spent: 65min    Derrious Bologna  [facility] Pager [phone]. If 7PM-7AM, please contact night-coverage at [email or web address], password [facility] 04/07/2015, 11:45 AM  LOS: 1 day

## 2015-04-07 NOTE — Progress Notes (Signed)
Progress Note   Subjective  Patient reports she feels okay this morning, although has some mild RUQ discomfort. She has been eating okay, more energy this morning and states she slept okay. She had a RUQ [place] with doppler overnight, report as below.    Objective   Vital signs in last 24 hours: Temp:  [97.6 F (36.4 C)-98.4 F (36.9 C)] 97.6 F (36.4 C) (09/08 0451) Pulse Rate:  [57-67] 57 (09/08 0451) Resp:  [16-18] 18 (09/08 0451) BP: (111-118)/(52-72) 118/72 mmHg (09/08 0451) SpO2:  [96 %-98 %] 98 % (09/08 0451) Weight:  [113 lb (51.256 kg)-116 lb 2.9 oz (52.7 kg)] 116 lb 2.9 oz (52.7 kg) (09/07 2020) Last BM Date: 04/05/15 General:    white female in NAD, jaundiced Eyes: scleral icterus Heart:  Regular rate and rhythm; 4/6 SEM at LUSB Lungs: Respirations even and unlabored, lungs CTA bilaterally Abdomen:  Soft, nontender and nondistended. Normal bowel sounds. Extremities:  Trace edema. Neurologic:  Alert and oriented,  grossly normal neurologically. No asterixis. Psych:  Cooperative. Normal mood and affect.  Intake/Output from previous day: 09/07 0701 - 09/08 0700 In: 120 [P.O.:120] Out: 400 [Urine:400] Intake/Output this shift: Total I/O In: 100 [P.O.:100] Out: -   Lab Results:  Recent Labs  04/06/15 1015 04/07/15 0523  WBC 6.0 4.9  HGB 13.7 12.2  HCT 40.0 36.1  PLT 174 155   BMET  Recent Labs  04/06/15 1015 04/07/15 0523  NA 137 138  K 4.5 4.3  CL 105 106  CO2 24 25  GLUCOSE 92 127*  BUN 13 12  CREATININE 0.96 0.91  CALCIUM 8.6* 8.3*   LFT  Recent Labs  04/07/15 0523  PROT 6.0*  ALBUMIN 2.1*  AST 122*  ALT 71*  ALKPHOS 245*  BILITOT 20.0*  BILIDIR 14.8*  IBILI 5.2*   PT/INR  Recent Labs  04/06/15 2034 04/07/15 0523  LABPROT 17.5* 17.9*  INR 1.42 1.47    Studies/Results: US Abdomen Complete  04/06/2015   CLINICAL DATA:  Acute liver failure.  Jaundice.  EXAM: ULTRASOUND ABDOMEN COMPLETE  COMPARISON:  None.  FINDINGS:  Gallbladder: Gallbladder is not visualized and may be surgically absent or contracted.  Common bile duct: Diameter: 5.3 mm, normal  Liver: Coarsened parenchymal echotexture with nodular contour suggesting hepatic cirrhosis. Prominence of portal veins with flow in the appropriate direction. Suggestion of mild intrahepatic bile duct dilatation. No focal liver lesions identified.  IVC: No abnormality visualized.  Pancreas: Visualized portion unremarkable.  Spleen: Spleen appears enlarged with homogeneous parenchymal echotexture.  Right Kidney: Length: 10.3 cm. Echogenicity within normal limits. No mass or hydronephrosis visualized.  Left Kidney: Length: 9.6 cm. Small cyst demonstrated in the lower pole measuring up to about 1 cm diameter. No hydronephrosis.  Abdominal aorta: No aneurysm visualized.  Other findings: None.  IMPRESSION: Coarsened liver echotexture with nodular contour and prominent pulmonary venous structures consistent with hepatic cirrhosis. No focal lesions identified. Suggestion of intrahepatic bile duct dilatation without extrahepatic ductal dilatation. Gallbladder not visualized. Probable splenic enlargement.   Electronically Signed   By: Lucienne Capers M.D.   On: 04/06/2015 23:51   [place] Art/ven Flow Abd Pelv Doppler  04/07/2015   CLINICAL DATA:  Jaundice, hepatic cirrhosis.  EXAM: DUPLEX ULTRASOUND OF LIVER  TECHNIQUE: Color and duplex Doppler ultrasound was performed to evaluate the hepatic in-flow and out-flow vessels. Exam is significantly limited as patient does not speak English and unable to follow breathing instructions.  COMPARISON:  None.  FINDINGS: Portal Vein Velocities  Main: 18.1 cm/sec proximally ; hepatopetal flow is noted proximally and in the midportion, but no flow is noted distally consistent with portal venous thrombus.  Right:  Not visualized due to limitations described above.  Left:  Not visualized due to limitations described above.  Hepatic Vein Velocities  Right:  20.5  cm/sec ; hepatofugal flow is noted.  Middle:  Not visualized due to limitations described above.  Left:  29.7 Cm/sec ; hepatofugal flow is noted.  Hepatic Artery Velocity:  146 cm/sec  Splenic Vein Velocity:  17.1 cm/sec  Varices: Present.  Ascites: Present.  IMPRESSION: Exam is significantly limited as patient does not speak English and unable to follow breathing instructions. There is probable occlusive thrombus seen in the distal portal vein. Middle hepatic vein is not visualized due to previously described limitations, but right and left hepatic veins demonstrate normal flow and velocity. These results will be called to the ordering clinician or representative by the Radiologist Assistant, and communication documented in the PACS or zVision Dashboard.   Electronically Signed   By: Marijo Conception, M.D.   On: 04/07/2015 08:12       Assessment / Plan:   81 y/o female with Ardine Eng class A cirrhosis due to chronic HCV, recently started therapy with Zepatier for HCV roughly 20 days ago, now symptomatic for the past 10 days with fatigue/malaise and development of jaundice. Profound hyperbilirubinemia with only mild elevation in ALT/AST. INR at 1.4, slightly increased today, up from baseline of 1.1. She is mentating normally and has no asterixis or evidence of encephalopathy. She denies any other new medications, supplements, or herbals. Recent travel to [place].Overnight she reports feeling improved. INR relatively stable, bilirubin appears to be downtrending. RUQ [place] with doppler obtained, with concern for possible portal vein thrombosis.   Given the time course of her symptoms and Zepatier use, drug induced liver injury from this is on the differential however prior reports of Zepatier drug induced liver injury (from Liver tox database) usually present with a hepatocellular pattern and not cholestatic. RUQ US shows normal CBD with mild intrahepatic dilation, and suggestion of distal portal vein thrombosis.  These findings were not seen on most recent outpatient CT. If she does have a portal vein thrombus, it would appear this is new. Recommend CT or MRI of the liver with IV contrast to more definitively evaluate for portal vein thrombosis (dopple is suggestive), assess for signs of chronicity (determine if acute or chronic), and further evaluate the biliary tree and rule out [facility] which could also present like this. I would also recommend obtaining an AFP level in this light.  We are otherwise awaiting serologies to rule out acute viral etiologies. She has had a prior workup for other chronic liver diseases which was unremarkable other than mild positive AMA. Patient otherwise denies other supplements/herbals, given her med list Zepatier would be the most likely to cause a liver injury/decompensation.   At this time she appears stable and would check LFTs tomorrow again with INR. I was unable to communicate this issue to the patient without her daughter present or translator. We discuss these issues with the family and patient when family arrives. The portal vein thrombosis, if present and acute, would warrant anticoagulation, and she would need screening for esophageal varices in this light to assess bleeding risk. If it appears chronic on CT, EGD for varices screening would help our guide our decision on whether or not to treat  RECOMMEND: -  CT or MRI of the liver with contrast, more definitively assess for portal vein thrombosis, rule out [place] -please obtain AFP level -await viral serologies -trend LFTs/coags, monitor mental status -further recommendations pending CT results and her course -hold outpatient meds  Please call with questions or changes in her status.  Gail Cellar, MD [facility] Pager [phone]  Principal Problem:   Acute liver failure Active Problems:   Chronic hepatitis C without hepatic coma   Hepatic cirrhosis   Hypertension   Hypothyroidism   Jaundice    Cirrhosis     LOS: 1 day   Diana Pearson  04/07/2015, 9:12 AM

## 2015-04-07 NOTE — Evaluation (Signed)
Physical Therapy Evaluation Patient Details Name: Diana Pearson MRN: [id number] DOB: [birth year] Today's Date: 04/07/2015   History of Present Illness  Pt admitted with new onset jaundice with hep C and cirrhosis. PMH: severe aortic stenosi, afib, DDD, HTN.  Clinical Impression  Patient is functioning at supervision level for all mobility and gait.  No acute PT needs identified - PT will sign off.    Follow Up Recommendations No PT follow up;Supervision for mobility/OOB    Equipment Recommendations  None recommended by PT    Recommendations for Other Services       Precautions / Restrictions Precautions Precautions: None Restrictions Weight Bearing Restrictions: No      Mobility  Bed Mobility Overal bed mobility: Modified Independent                Transfers Overall transfer level: Needs assistance Equipment used: None Transfers: Sit to/from Stand Sit to Stand: Supervision         General transfer comment: Supervision for safety only.  Ambulation/Gait Ambulation/Gait assistance: Supervision Ambulation Distance (Feet): 30 Feet Assistive device: None Gait Pattern/deviations: Step-through pattern;Decreased stride length;Shuffle     General Gait Details: Patient able to ambulate with no assistive device with fairly good balance.  No loss of balance during gait or turns.  Stairs            Wheelchair Mobility    Modified Rankin (Stroke Patients Only)       Balance     Sitting balance-Leahy Scale: Good       Standing balance-Leahy Scale: Good                               Pertinent Vitals/Pain Pain Assessment: No/denies pain    Home Living Family/patient expects to be discharged to:: Private residence Living Arrangements: Children (daughter) Available Help at Discharge: Family;Available 24 hours/day Type of Home: House Home Access: Level entry     Home Layout: Two level;Bed/bath upstairs Home Equipment: Cane - single  point;Shower seat      Prior Function Level of Independence: Independent;Needs assistance   Gait / Transfers Assistance Needed: ambulates without a device  ADL's / Homemaking Assistance Needed: Daughter supervises patient for shower, pt indicates that she cooks        Hand Dominance   Dominant Hand: Right    Extremity/Trunk Assessment   Upper Extremity Assessment: Defer to OT evaluation           Lower Extremity Assessment: Overall WFL for tasks assessed         Communication   Communication: Prefers language other than [place] (Arabic; Used Geophysical data processor - Roxy Cedar)  Cognition Arousal/Alertness: Awake/alert Behavior During Therapy: WFL for tasks assessed/performed Overall Cognitive Status: Within Functional Limits for tasks assessed                      General Comments      Exercises        Assessment/Plan    PT Assessment Patent does not need any further PT services  PT Diagnosis Generalized weakness   PT Problem List    PT Treatment Interventions     PT Goals (Current goals can be found in the Care Plan section) Acute Rehab PT Goals PT Goal Formulation: All assessment and education complete, DC therapy    Frequency     Barriers to discharge        Co-evaluation PT/OT/SLP Co-Evaluation/Treatment: Yes Reason  for Co-Treatment: Other (comment) (To coordinate utilization of interpreter services and eval) PT goals addressed during session: Mobility/safety with mobility         End of Session   Activity Tolerance: Patient tolerated treatment well Patient left: in bed;with call bell/phone within reach Nurse Communication: Mobility status    Functional Assessment Tool Used: Clinical judgement Functional Limitation: Mobility: Walking and moving around Mobility: Walking and Moving Around Current Status (R1165): At least 1 percent but less than 20 percent impaired, limited or restricted Mobility: Walking and Moving Around Goal Status  [phone]): At least 1 percent but less than 20 percent impaired, limited or restricted Mobility: Walking and Moving Around Discharge Status [phone]): At least 1 percent but less than 20 percent impaired, limited or restricted    Time: 2919-1660 PT Time Calculation (min) (ACUTE ONLY): 16 min   Charges:   PT Evaluation $Initial PT Evaluation Tier I: 1 Procedure     PT G Codes:   PT G-Codes **NOT FOR INPATIENT CLASS** Functional Assessment Tool Used: Clinical judgement Functional Limitation: Mobility: Walking and moving around Mobility: Walking and Moving Around Current Status (A0045): At least 1 percent but less than 20 percent impaired, limited or restricted Mobility: Walking and Moving Around Goal Status [phone]): At least 1 percent but less than 20 percent impaired, limited or restricted Mobility: Walking and Moving Around Discharge Status [phone]): At least 1 percent but less than 20 percent impaired, limited or restricted    Despina Pole 04/07/2015, 8:30 PM Carita Pian. Sanjuana Kava, [place] Pager [phone]

## 2015-04-08 DIAGNOSIS — R160 Hepatomegaly, not elsewhere classified: Secondary | ICD-10-CM

## 2015-04-08 DIAGNOSIS — Z9689 Presence of other specified functional implants: Secondary | ICD-10-CM

## 2015-04-08 LAB — PROTIME-INR
INR: 1.59 — ABNORMAL HIGH (ref 0.00–1.49)
Prothrombin Time: 19 seconds — ABNORMAL HIGH (ref 11.6–15.2)

## 2015-04-08 LAB — COMPREHENSIVE METABOLIC PANEL
ALBUMIN: 2 g/dL — AB (ref 3.5–5.0)
ALK PHOS: 257 U/L — AB (ref 38–126)
ALT: 72 U/L — AB (ref 14–54)
AST: 131 U/L — AB (ref 15–41)
Anion gap: 7 (ref 5–15)
BILIRUBIN TOTAL: 20.1 mg/dL — AB (ref 0.3–1.2)
BUN: 8 mg/dL (ref 6–20)
CO2: 25 mmol/L (ref 22–32)
CREATININE: 0.79 mg/dL (ref 0.44–1.00)
Calcium: 8.3 mg/dL — ABNORMAL LOW (ref 8.9–10.3)
Chloride: 103 mmol/L (ref 101–111)
GFR calc Af Amer: >60 mL/min (ref >60)
GFR calc non Af Amer: >60 mL/min (ref >60)
GLUCOSE: 91 mg/dL (ref 65–99)
POTASSIUM: 4.5 mmol/L (ref 3.5–5.1)
Sodium: 135 mmol/L (ref 135–145)
TOTAL PROTEIN: 5.9 g/dL — AB (ref 6.5–8.1)

## 2015-04-08 LAB — AFP TUMOR MARKER: AFP-Tumor Marker: 46.6 ng/mL — ABNORMAL HIGH (ref 0.0–8.3)

## 2015-04-08 LAB — CBC
HCT: 37.8 % (ref 36.0–46.0)
Hemoglobin: 13 g/dL (ref 12.0–15.0)
MCH: 31.3 pg (ref 26.0–34.0)
MCHC: 34.4 g/dL (ref 30.0–36.0)
MCV: 91.1 fL (ref 78.0–100.0)
PLATELETS: 170 10*3/uL (ref 150–400)
RBC: 4.15 MIL/uL (ref 3.87–5.11)
RDW: 16.6 % — ABNORMAL HIGH (ref 11.5–15.5)
WBC: 5.8 10*3/uL (ref 4.0–10.5)

## 2015-04-08 LAB — APTT: APTT: 34 s (ref 24–37)

## 2015-04-08 LAB — CMV IGM: CMV IGM: 54.4 [AU]/ml — AB (ref 0.0–29.9)

## 2015-04-08 LAB — HEPATITIS C RNA QUANTITATIVE
HCV Quantitative Log: 2.36 {Log} — ABNORMAL HIGH (ref <1.18)
HCV Quantitative: 231 IU/mL — ABNORMAL HIGH (ref <15)

## 2015-04-08 LAB — CMV ANTIBODY, IGG (EIA): CMV Ab - IgG: >10 U/mL — ABNORMAL HIGH (ref 0.00–0.59)

## 2015-04-08 MED ORDER — PIPERACILLIN-TAZOBACTAM 3.375 G IVPB
3.3750 g | INTRAVENOUS | Status: DC
  Filled 2015-04-08: qty 50

## 2015-04-08 MED ORDER — PIPERACILLIN-TAZOBACTAM 3.375 G IVPB
3.3750 g | INTRAVENOUS | Status: AC
  Administered 2015-04-09: 3.375 g via INTRAVENOUS
  Filled 2015-04-08: qty 50

## 2015-04-08 MED ORDER — CEFAZOLIN SODIUM-DEXTROSE 2-3 GM-% IV SOLR: 2.0000 g | INTRAVENOUS | Status: DC

## 2015-04-08 NOTE — Progress Notes (Signed)
Discussed case with GI Suspect HCCa Would not restart her Hep C rx at this point.  Will be available as needed

## 2015-04-08 NOTE — Progress Notes (Signed)
Progress Note   Subjective  Patient had a CT liver done overnight with report done this AM. Patient reports feeling about the same. She has some mild RUQ discomfort. Eating okay. Labs as below, bilirubinemia unchanged.    Objective   Vital signs in last 24 hours: Temp:  [97.6 F (36.4 C)-98.4 F (36.9 C)] 98.4 F (36.9 C) (09/09 0856) Pulse Rate:  [63-66] 64 (09/09 0856) Resp:  [18] 18 (09/09 0856) BP: (107-138)/(49-64) 121/59 mmHg (09/09 0856) SpO2:  [94 %-97 %] 96 % (09/09 0856) Weight:  [114 lb 3.2 oz (51.8 kg)] 114 lb 3.2 oz (51.8 kg) (09/08 2107) Last BM Date: 04/07/15 General:    white female, jaundiced Heart:  4/6 SEM at LUSB Lungs: Respirations even and unlabored, lungs CTA bilaterally Abdomen:  Soft, mild RUQ TTP, and nondistended. Normal bowel sounds. Extremities:  Without edema. Neurologic:  Alert and oriented,  grossly normal neurologically. Psych:  Cooperative. Normal mood and affect.  Intake/Output from previous day: 09/08 0701 - 09/09 0700 In: 220 [P.O.:220] Out: -  Intake/Output this shift:    Lab Results:  Recent Labs  04/06/15 1015 04/07/15 0523 04/08/15 0720  WBC 6.0 4.9 5.8  HGB 13.7 12.2 13.0  HCT 40.0 36.1 37.8  PLT 174 155 170   BMET  Recent Labs  04/06/15 1015 04/07/15 0523 04/08/15 0720  NA 137 138 135  K 4.5 4.3 4.5  CL 105 106 103  CO2 24 25 25   GLUCOSE 92 127* 91  BUN 13 12 8   CREATININE 0.96 0.91 0.79  CALCIUM 8.6* 8.3* 8.3*   LFT  Recent Labs  04/07/15 0523 04/08/15 0720  PROT 6.0* 5.9*  ALBUMIN 2.1* 2.0*  AST 122* 131*  ALT 71* 72*  ALKPHOS 245* 257*  BILITOT 20.0* 20.1*  BILIDIR 14.8*  --   IBILI 5.2*  --    PT/INR  Recent Labs  04/07/15 0523 04/08/15 0720  LABPROT 17.9* 19.0*  INR 1.47 1.59*    Studies/Results: Ct Abdomen Pelvis W Wo Contrast  04/08/2015   CLINICAL DATA:  81 year old female with hepatitis-C, jaundice, hypothyroidism and abdominal distention. Possible portal vein thrombosis  on recent sonogram.  EXAM: CT ABDOMEN AND PELVIS WITHOUT AND WITH CONTRAST  TECHNIQUE: Multidetector CT imaging of the abdomen and pelvis was performed following the standard protocol before and following the bolus administration of intravenous contrast.  CONTRAST:  144mL OMNIPAQUE IOHEXOL 300 MG/ML  SOLN  COMPARISON:  04/07/2015 abdominal sonogram.  FINDINGS: Lower chest: Cardiomegaly. There is atherosclerosis of the thoracic aorta, the great vessels of the mediastinum and the coronary arteries, including calcified atherosclerotic plaque in the left anterior descending, left circumflex and right coronary arteries. Aortic valvular and mitral annular calcifications are present. Subsegmental dependent atelectasis at both lung bases.  Hepatobiliary: The liver surface is diffusely irregular and there is asymmetric hypertrophy of the lateral left liver lobe, indicating cirrhosis. There is an ill-defined 2.9 x 2.2 cm liver mass within the central segment 8 right liver lobe (series 501/image 49), which demonstrates progressive heterogeneous enhancement. There is marked diffuse intrahepatic biliary ductal dilatation. There is an enhancing tumor thrombus at the main portal vein bifurcation extending into the proximal left and right portal vein branches. The gallbladder is contracted. The common bile duct is normal caliber (4 mm diameter). There are no additional liver masses.  Pancreas: No pancreatic mass. Ill-defined peripancreatic fluid throughout the length of the pancreas. No focal peripancreatic fluid collection. No main pancreatic duct dilation.  Spleen: Top normal size spleen (12.2 cm craniocaudal splenic length). No splenic mass.  Adrenals/Urinary Tract: Normal adrenals. Simple 1.0 cm posterior interpolar left renal cyst. Simple 1.1 cm exophytic lower left renal cyst. Additional subcentimeter hypodense left renal lesions, too small to characterize. No hydronephrosis. No nephrolithiasis. Visualized ureters are normal  caliber. On delayed imaging, there is no urothelial wall thickening and there are no filling defects in the opacified portions of the bilateral collecting systems or visualized proximal ureters.  Stomach/Bowel: Relatively collapsed and grossly normal stomach normal caliber small bowel, with no appreciable small bowel wall thickening normal appendix. Normal large bowel, with no large bowel wall thickening.  Vascular/Lymphatic: Atherosclerotic nonaneurysmal abdominal aorta. Patent splenic, hepatic and renal veins. Enlarged 1.4 cm short axis gastrohepatic ligament lymph node (series 501/ image 65). Mild porta hepatis lymphadenopathy.  Reproductive: Grossly normal uterus. Simple 2.3 cm left adnexal cystic structure (series 501/image 152).  Other: Small volume ascites.  No pneumoperitoneum.  Musculoskeletal: Partially visualized right total hip arthroplasty. Mild T10 and L1 vertebral body compression fractures and severe L2 vertebral body compression fracture of indeterminate chronicity. Mild to moderate degenerative changes in the visualized thoracolumbar spine. No aggressive appearing focal osseous lesions.  IMPRESSION: 1. 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 A cholangiocarcinoma (Kl[place]kin tumor) is favored given the location and biliary ductal dilatation, however hepatocellular carcinoma is on the differential given the underlying cirrhosis and the presence of portal vein tumor thrombus. 2. Small volume ascites.  Top-normal size spleen. 3. Mild porta hepatis and gastrohepatic ligament lymphadenopathy, nonspecific. 4. Mild T10 and L1 and superior L2 vertebral body compression fractures of indeterminate chronicity, likely chronic. 5. Cardiomegaly. Atherosclerosis, including three-vessel coronary artery disease. Please  note that although the presence of coronary artery calcium documents the presence of coronary artery disease, the severity of this disease and any potential stenosis cannot be assessed on this non-gated CT examination. 6. Simple benign-appearing 2.3 cm left adnexal cyst. No follow-up is required. This recommendation follows ACR consensus guidelines: White Paper of the ACR Incidental Findings Committee II on Adnexal Findings. J Am Coll Radiol [phone]. These results were called by telephone at the time of interpretation on 04/08/2015 at 8:55 am to Dr. Nicoletta Ba , who verbally acknowledged these results.   Electronically Signed   By: Ilona Sorrel M.D.   On: 04/08/2015 09:06   US Abdomen Complete  04/06/2015   CLINICAL DATA:  Acute liver failure.  Jaundice.  EXAM: ULTRASOUND ABDOMEN COMPLETE  COMPARISON:  None.  FINDINGS: Gallbladder: Gallbladder is not visualized and may be surgically absent or contracted.  Common bile duct: Diameter: 5.3 mm, normal  Liver: Coarsened parenchymal echotexture with nodular contour suggesting hepatic cirrhosis. Prominence of portal veins with flow in the appropriate direction. Suggestion of mild intrahepatic bile duct dilatation. No focal liver lesions identified.  IVC: No abnormality visualized.  Pancreas: Visualized portion unremarkable.  Spleen: Spleen appears enlarged with homogeneous parenchymal echotexture.  Right Kidney: Length: 10.3 cm. Echogenicity within normal limits. No mass or hydronephrosis visualized.  Left Kidney: Length: 9.6 cm. Small cyst demonstrated in the lower pole measuring up to about 1 cm diameter. No hydronephrosis.  Abdominal aorta: No aneurysm visualized.  Other findings: None.  IMPRESSION: Coarsened liver echotexture with nodular contour and prominent pulmonary venous structures consistent with hepatic cirrhosis. No focal lesions identified. Suggestion of  intrahepatic bile duct dilatation without extrahepatic ductal dilatation. Gallbladder not  visualized. Probable splenic enlargement.   Electronically Signed   By: Lucienne Capers M.D.   On: 04/06/2015 23:51   [place] Art/ven Flow Abd Pelv Doppler  04/07/2015   CLINICAL DATA:  Jaundice, hepatic cirrhosis.  EXAM: DUPLEX ULTRASOUND OF LIVER  TECHNIQUE: Color and duplex Doppler ultrasound was performed to evaluate the hepatic in-flow and out-flow vessels. Exam is significantly limited as patient does not speak English and unable to follow breathing instructions.  COMPARISON:  None.  FINDINGS: Portal Vein Velocities  Main: 18.1 cm/sec proximally ; hepatopetal flow is noted proximally and in the midportion, but no flow is noted distally consistent with portal venous thrombus.  Right:  Not visualized due to limitations described above.  Left:  Not visualized due to limitations described above.  Hepatic Vein Velocities  Right:  20.5 cm/sec ; hepatofugal flow is noted.  Middle:  Not visualized due to limitations described above.  Left:  29.7 Cm/sec ; hepatofugal flow is noted.  Hepatic Artery Velocity:  146 cm/sec  Splenic Vein Velocity:  17.1 cm/sec  Varices: Present.  Ascites: Present.  IMPRESSION: Exam is significantly limited as patient does not speak English and unable to follow breathing instructions. There is probable occlusive thrombus seen in the distal portal vein. Middle hepatic vein is not visualized due to previously described limitations, but right and left hepatic veins demonstrate normal flow and velocity. These results will be called to the ordering clinician or representative by the Radiologist Assistant, and communication documented in the PACS or zVision Dashboard.   Electronically Signed   By: Marijo Conception, M.D.   On: 04/07/2015 08:12       Assessment / Plan:   81 y/o female with Ardine Eng class A cirrhosis due to chronic HCV, recently started therapy with Zepatier for HCV roughly 20 days ago, now symptomatic for the past 10 days with fatigue/malaise and development of jaundice. Workup  done in the past 24 hours showed a RUQ [place] with suggestion of portal vein thrombosis and dilation of intrahepatics. CT liver shows a roughly 3cm mid liver mass with high grade stricture of the intrahepatic biliary tree, normal CBD, with portal vein thrombosis. AFP elevated. I discussed the CT with radiology. Given her HCV/cirrhosis history this may likely be [place] however cholangiocarcinoma could also appear like this. Labs essentially unchanged at this time.   I discussed the findings with the patient's daughter at length this morning. At this time the patient is in need of biliary drainage. I discussed the case with our advanced endoscopist and it is thought ERCP would be technically very difficult to drain this intrahepatic stricture, and recommended IR consultation for drainage. If IR is also able to obtain tissue diagnosis while doing this with either brushing or biopsy as they deem appropriate that may be helpful. Patient's daughter agreed to IR consultation for this, we will await this result. Otherwise, anticipate oncology consultation regarding longer term management for suspected malignancy and regarding portal vein thrombosis.   Please call with questions/concerns.   [place] Cellar, MD [facility] Pager [phone]   Principal Problem:   Acute liver failure Active Problems:   Chronic hepatitis C without hepatic coma   Hepatic cirrhosis   Hypertension   Hypothyroidism   Jaundice   Cirrhosis   Liver failure, acute   Liver mass    LOS: 2 days   Diana Pearson  04/08/2015, 9:53 AM

## 2015-04-08 NOTE — Progress Notes (Signed)
CRITICAL VALUE ALERT  Critical value received:  T Bili 20.1  Date of notification:  9/9  Time of notification:  0850  Critical value read back:Yes.    Nurse who received alert:  Tyna Jaksch  MD notified (1st page):  Dr. Broadus John  Time of first page:  [phone]  Responding MD:  Dr. Broadus John  Time MD responded:  [phone]

## 2015-04-08 NOTE — Progress Notes (Addendum)
[facility] PROGRESS NOTE  Rosanne Wohlfarth WYO:[id number] DOB: 10-20-1928 DOA: 04/06/2015 PCP: No PCP Per Patient  Assessment/Plan: Cholestatic jaundice/Liver mass -CT abd pelvis with 2.9x2.2cm mass in R liver, likely HCC, AFP 46, cholangiocarcinoma also possible -d/w GI, will need Biopsy and decompression of bile duct -Bili stable, mentation clear, no asterixis, PT/INR higher today -appreciate Gi input -statin stopped  -Acute hepatitis panel negative except HCV Ab -CMV IgM and IgG elevated, will await ID input  Hypertension -stable, continue metoprolol.  Hypothyroidism Continue levothyroxine  Hyperlipidemia  stopped statin  due to liver failure.  Atrial fibrillation Rate controlled with metoprolol. Not on anticoagulation. She is followed by Dr. Dorris Carnes.  Aortic stenosis. Recent echo 4/16 shows severe stenosis of the aortic valve and mildly increased systolic pressure. No wall motion abnormalities. LV EF was 60-65%. FU with Cards  DVT proph: will stop heparin, since PT elevated from cirrhosis  Family Communication: Talked to patient's dtr, [name] and updated on CT findings Code Status: Full.  Dispo: home pending workup  Consultants: GI ID  HPI/Subjective: Feels ok, no complaints, tolerating diet, denies any pain now  Objective: Filed Vitals:   04/08/15 0856  BP: 121/59  Pulse: 64  Temp: 98.4 F (36.9 C)  Resp: 18    Intake/Output Summary (Last 24 hours) at 04/08/15 0926 Last data filed at 04/08/15 0600  Gross per 24 hour  Intake    120 ml  Output      0 ml  Net    120 ml   Filed Weights   04/06/15 2020 04/07/15 2107  Weight: 52.7 kg (116 lb 2.9 oz) 51.8 kg (114 lb 3.2 oz)    Exam:   General:  AAOx3, profoundly icteric  Cardiovascular: D7A1/OIN, systolic murmur  Respiratory: CTAB  Abdomen: soft, Nt, BS present  Musculoskeletal: no edema c/c   Data Reviewed: Basic Metabolic Panel:  Recent Labs Lab 04/06/15 1015 04/07/15 0523  04/08/15 0720  NA 137 138 135  K 4.5 4.3 4.5  CL 105 106 103  CO2 24 25 25   GLUCOSE 92 127* 91  BUN 13 12 8   CREATININE 0.96 0.91 0.79  CALCIUM 8.6* 8.3* 8.3*   Liver Function Tests:  Recent Labs Lab 04/06/15 1015 04/06/15 1124 04/07/15 0523 04/08/15 0720  AST 159*  --  122* 131*  ALT 93*  --  71* 72*  ALKPHOS 294*  --  245* 257*  BILITOT 23.5* 23.2* 20.0* 20.1*  PROT 6.5  --  6.0* 5.9*  ALBUMIN 2.7*  --  2.1* 2.0*   No results for input(s): LIPASE, AMYLASE in the last 168 hours. No results for input(s): AMMONIA in the last 168 hours. CBC:  Recent Labs Lab 04/06/15 1015 04/07/15 0523 04/08/15 0720  WBC 6.0 4.9 5.8  NEUTROABS 4.0  --   --   HGB 13.7 12.2 13.0  HCT 40.0 36.1 37.8  MCV 92.8 90.7 91.1  PLT 174 155 170   Cardiac Enzymes: No results for input(s): CKTOTAL, CKMB, CKMBINDEX, TROPONINI in the last 168 hours. BNP (last 3 results) No results for input(s): BNP in the last 8760 hours.  ProBNP (last 3 results) No results for input(s): PROBNP in the last 8760 hours.  CBG: No results for input(s): GLUCAP in the last 168 hours.  No results found for this or any previous visit (from the past 240 hour(s)).   Studies: Ct Abdomen Pelvis W Wo Contrast  04/08/2015   CLINICAL DATA:  81 year old female with hepatitis-C, jaundice, hypothyroidism and abdominal  distention. Possible portal vein thrombosis on recent sonogram.  EXAM: CT ABDOMEN AND PELVIS WITHOUT AND WITH CONTRAST  TECHNIQUE: Multidetector CT imaging of the abdomen and pelvis was performed following the standard protocol before and following the bolus administration of intravenous contrast.  CONTRAST:  159mL OMNIPAQUE IOHEXOL 300 MG/ML  SOLN  COMPARISON:  04/07/2015 abdominal sonogram.  FINDINGS: Lower chest: Cardiomegaly. There is atherosclerosis of the thoracic aorta, the great vessels of the mediastinum and the coronary arteries, including calcified atherosclerotic plaque in the left anterior descending,  left circumflex and right coronary arteries. Aortic valvular and mitral annular calcifications are present. Subsegmental dependent atelectasis at both lung bases.  Hepatobiliary: The liver surface is diffusely irregular and there is asymmetric hypertrophy of the lateral left liver lobe, indicating cirrhosis. There is an ill-defined 2.9 x 2.2 cm liver mass within the central segment 8 right liver lobe (series 501/image 49), which demonstrates progressive heterogeneous enhancement. There is marked diffuse intrahepatic biliary ductal dilatation. There is an enhancing tumor thrombus at the main portal vein bifurcation extending into the proximal left and right portal vein branches. The gallbladder is contracted. The common bile duct is normal caliber (4 mm diameter). There are no additional liver masses.  Pancreas: No pancreatic mass. Ill-defined peripancreatic fluid throughout the length of the pancreas. No focal peripancreatic fluid collection. No main pancreatic duct dilation.  Spleen: Top normal size spleen (12.2 cm craniocaudal splenic length). No splenic mass.  Adrenals/Urinary Tract: Normal adrenals. Simple 1.0 cm posterior interpolar left renal cyst. Simple 1.1 cm exophytic lower left renal cyst. Additional subcentimeter hypodense left renal lesions, too small to characterize. No hydronephrosis. No nephrolithiasis. Visualized ureters are normal caliber. On delayed imaging, there is no urothelial wall thickening and there are no filling defects in the opacified portions of the bilateral collecting systems or visualized proximal ureters.  Stomach/Bowel: Relatively collapsed and grossly normal stomach normal caliber small bowel, with no appreciable small bowel wall thickening normal appendix. Normal large bowel, with no large bowel wall thickening.  Vascular/Lymphatic: Atherosclerotic nonaneurysmal abdominal aorta. Patent splenic, hepatic and renal veins. Enlarged 1.4 cm short axis gastrohepatic ligament lymph node  (series 501/ image 65). Mild porta hepatis lymphadenopathy.  Reproductive: Grossly normal uterus. Simple 2.3 cm left adnexal cystic structure (series 501/image 152).  Other: Small volume ascites.  No pneumoperitoneum.  Musculoskeletal: Partially visualized right total hip arthroplasty. Mild T10 and L1 vertebral body compression fractures and severe L2 vertebral body compression fracture of indeterminate chronicity. Mild to moderate degenerative changes in the visualized thoracolumbar spine. No aggressive appearing focal osseous lesions.  IMPRESSION: 1. 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 A cholangiocarcinoma ([place] tumor) is favored given the location and biliary ductal dilatation, however hepatocellular carcinoma is on the differential given the underlying cirrhosis and the presence of portal vein tumor thrombus. 2. Small volume ascites.  Top-normal size spleen. 3. Mild porta hepatis and gastrohepatic ligament lymphadenopathy, nonspecific. 4. Mild T10 and L1 and superior L2 vertebral body compression fractures of indeterminate chronicity, likely chronic. 5. Cardiomegaly. Atherosclerosis, including three-vessel coronary artery disease. Please note that although the presence of coronary artery calcium documents the presence of coronary artery disease, the severity of this disease and any potential stenosis cannot be assessed on this non-gated CT examination. 6. Simple benign-appearing 2.3 cm left adnexal cyst. No follow-up is required. This recommendation  follows ACR consensus guidelines: White Paper of the ACR Incidental Findings Committee II on Adnexal Findings. J Am Coll Radiol [phone]. These results were called by telephone at the time of interpretation on 04/08/2015 at 8:55 am to Dr. Nicoletta Ba , who  verbally acknowledged these results.   Electronically Signed   By: Ilona Sorrel M.D.   On: 04/08/2015 09:06   US Abdomen Complete  04/06/2015   CLINICAL DATA:  Acute liver failure.  Jaundice.  EXAM: ULTRASOUND ABDOMEN COMPLETE  COMPARISON:  None.  FINDINGS: Gallbladder: Gallbladder is not visualized and may be surgically absent or contracted.  Common bile duct: Diameter: 5.3 mm, normal  Liver: Coarsened parenchymal echotexture with nodular contour suggesting hepatic cirrhosis. Prominence of portal veins with flow in the appropriate direction. Suggestion of mild intrahepatic bile duct dilatation. No focal liver lesions identified.  IVC: No abnormality visualized.  Pancreas: Visualized portion unremarkable.  Spleen: Spleen appears enlarged with homogeneous parenchymal echotexture.  Right Kidney: Length: 10.3 cm. Echogenicity within normal limits. No mass or hydronephrosis visualized.  Left Kidney: Length: 9.6 cm. Small cyst demonstrated in the lower pole measuring up to about 1 cm diameter. No hydronephrosis.  Abdominal aorta: No aneurysm visualized.  Other findings: None.  IMPRESSION: Coarsened liver echotexture with nodular contour and prominent pulmonary venous structures consistent with hepatic cirrhosis. No focal lesions identified. Suggestion of intrahepatic bile duct dilatation without extrahepatic ductal dilatation. Gallbladder not visualized. Probable splenic enlargement.   Electronically Signed   By: Lucienne Capers M.D.   On: 04/06/2015 23:51   [place] Art/ven Flow Abd Pelv Doppler  04/07/2015   CLINICAL DATA:  Jaundice, hepatic cirrhosis.  EXAM: DUPLEX ULTRASOUND OF LIVER  TECHNIQUE: Color and duplex Doppler ultrasound was performed to evaluate the hepatic in-flow and out-flow vessels. Exam is significantly limited as patient does not speak English and unable to follow breathing instructions.  COMPARISON:  None.  FINDINGS: Portal Vein Velocities  Main: 18.1 cm/sec proximally ; hepatopetal flow is noted  proximally and in the midportion, but no flow is noted distally consistent with portal venous thrombus.  Right:  Not visualized due to limitations described above.  Left:  Not visualized due to limitations described above.  Hepatic Vein Velocities  Right:  20.5 cm/sec ; hepatofugal flow is noted.  Middle:  Not visualized due to limitations described above.  Left:  29.7 Cm/sec ; hepatofugal flow is noted.  Hepatic Artery Velocity:  146 cm/sec  Splenic Vein Velocity:  17.1 cm/sec  Varices: Present.  Ascites: Present.  IMPRESSION: Exam is significantly limited as patient does not speak English and unable to follow breathing instructions. There is probable occlusive thrombus seen in the distal portal vein. Middle hepatic vein is not visualized due to previously described limitations, but right and left hepatic veins demonstrate normal flow and velocity. These results will be called to the ordering clinician or representative by the Radiologist Assistant, and communication documented in the PACS or zVision Dashboard.   Electronically Signed   By: Marijo Conception, M.D.   On: 04/07/2015 08:12    Scheduled Meds: . heparin  5,000 Units Subcutaneous 3 times per day  . levothyroxine  88 mcg Oral QAC breakfast  . metoprolol tartrate  12.5 mg Oral BID   Continuous Infusions:  Antibiotics Given (last 72 hours)    None      Principal Problem:   Acute liver failure Active Problems:   Chronic hepatitis C without hepatic coma   Hepatic cirrhosis   Hypertension  Hypothyroidism   Jaundice   Cirrhosis   Liver failure, acute    Time spent: 60min    Jhonatan Lomeli  [facility] Pager [phone]. If 7PM-7AM, please contact night-coverage at [email or web address], password [facility] 04/08/2015, 9:26 AM  LOS: 2 days

## 2015-04-08 NOTE — Consult Note (Signed)
Chief Complaint: Patient was seen in consultation today for obstructive jaundice at the request of Dr Havery Moros  Referring Physician(s): Dr Taylors Falls Cellar  History of Present Illness: Diana Pearson is a 81 y.o. female   Pt with Hep C- Cirrhosis Recent started therapy x 3 weeks Admitted 9/7 with jaundice; malaise; fatigue x 10 days or more [place]: IMPRESSION: Coarsened liver echotexture with nodular contour and prominent pulmonary venous structures consistent with hepatic cirrhosis. No focal lesions identified. Suggestion of intrahepatic bile duct dilatation without extrahepatic ductal dilatation. Gallbladder not visualized. Probable splenic enlargement.  CT:  IMPRESSION: 1. 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 A cholangiocarcinoma (Kl[place]kin tumor) is favored given the location and biliary ductal dilatation, however hepatocellular carcinoma is on the differential given the underlying cirrhosis and the presence of portal vein tumor thrombus. 2. Small volume ascites. Top-normal size spleen. 3. Mild porta hepatis and gastrohepatic ligament lymphadenopathy, nonspecific  Bili elevated ; AFP elevated  Request for IR biliary drain placement per Dr Havery Moros Dr Earleen Newport has reviewed chart and imaging Approves procedure  Past Medical History  Diagnosis Date  . Hypothyroidism   . Osteoporosis   . Atrial fibrillation     no anticoagulation.   . Jaundice 04/06/2015    Sudden onset, 10 days following initiation of Zepatier for Hep C.   . Abdominal distention 04/06/2015  . Aortic stenosis 10/2014    by echo.   . Hypercholesterolemia   . History of blood transfusion ~ 2006    "related to bleeding stomach ulcer"  . Bleeding stomach ulcer ~ 2006  . Hepatitis C ~ 2006    needle stick  in 1970s    Past Surgical History  Procedure Laterality Date  . Hip fracture surgery Right 2012  . Fracture surgery    . Cataract extraction w/ intraocular lens  implant, bilateral Bilateral 1990's    Allergies: Review of patient's allergies indicates no known allergies.  Medications: Prior to Admission medications   Medication Sig Start Date End Date Taking? Authorizing Provider  levothyroxine (SYNTHROID, LEVOTHROID) 88 MCG tablet Take 88 mcg by mouth daily before breakfast.   Yes Historical Provider, MD  metoprolol tartrate (LOPRESSOR) 25 MG tablet Take 0.5 tablets (12.5 mg total) by mouth 2 (two) times daily. 10/11/14  Yes Fay Records, MD  pravastatin (PRAVACHOL) 20 MG tablet Take 1 tablet (20 mg total) by mouth daily. 10/11/14  Yes Fay Records, MD  ondansetron (ZOFRAN) 4 MG tablet Take 1 tablet (4 mg total) by mouth every 8 (eight) hours as needed for nausea or vomiting. Patient not taking: Reported on 04/06/2015 03/24/15   Thayer Headings, MD     Family History  Problem Relation Age of Onset  . Stroke Mother     Social History   Social History  . Marital Status: Widowed    Spouse Name: N/A  . Number of Children: N/A  . Years of Education: N/A   Social History Main Topics  . Smoking status: Former Smoker -- 0.12 packs/day for 10 years    Types: Cigarettes  . Smokeless tobacco: Never Used     Comment: "quit smoking in the 1990's"  . Alcohol Use: No  . Drug Use: No  . Sexual Activity: No   Other Topics Concern  . None   Social History Narrative  Review of Systems: A 12 point ROS discussed and pertinent positives are indicated in the HPI above.  All other systems are negative.  Review of Systems  Constitutional: Positive for activity change, appetite change, fatigue and unexpected weight change.  Respiratory: Negative for shortness of breath.   Cardiovascular: Negative for chest pain.  Gastrointestinal: Positive for nausea and abdominal pain.  Genitourinary:  Negative for difficulty urinating.  Musculoskeletal: Negative for back pain.  Skin: Positive for color change.  Neurological: Positive for weakness.  Psychiatric/Behavioral: Negative for behavioral problems and confusion.    Vital Signs: BP 121/59 mmHg  Pulse 64  Temp(Src) 98.4 F (36.9 C) (Oral)  Resp 18  Wt 114 lb 3.2 oz (51.8 kg)  SpO2 96%  Physical Exam  Constitutional: She is oriented to person, place, and time.  Does not speak English  Eyes: Scleral icterus is present.  Cardiovascular: Normal rate, regular rhythm and normal heart sounds.   Pulmonary/Chest: Effort normal and breath sounds normal. She has no wheezes.  Abdominal: Soft. Bowel sounds are normal.  Musculoskeletal: Normal range of motion.  Neurological: She is alert and oriented to person, place, and time.  Skin: Skin is warm and dry.  + jaundice  Psychiatric: She has a normal mood and affect. Her behavior is normal. Judgment and thought content normal.  Consented with Dtr at bedside  Nursing note and vitals reviewed.   Mallampati Score:  MD Evaluation Airway: WNL Heart: WNL Abdomen: WNL Chest/ Lungs: WNL ASA  Classification: 3 Mallampati/Airway Score: One  Imaging: Ct Abdomen Pelvis W Wo Contrast  04/08/2015   CLINICAL DATA:  81 year old female with hepatitis-C, jaundice, hypothyroidism and abdominal distention. Possible portal vein thrombosis on recent sonogram.  EXAM: CT ABDOMEN AND PELVIS WITHOUT AND WITH CONTRAST  TECHNIQUE: Multidetector CT imaging of the abdomen and pelvis was performed following the standard protocol before and following the bolus administration of intravenous contrast.  CONTRAST:  163mL OMNIPAQUE IOHEXOL 300 MG/ML  SOLN  COMPARISON:  04/07/2015 abdominal sonogram.  FINDINGS: Lower chest: Cardiomegaly. There is atherosclerosis of the thoracic aorta, the great vessels of the mediastinum and the coronary arteries, including calcified atherosclerotic plaque in the left anterior  descending, left circumflex and right coronary arteries. Aortic valvular and mitral annular calcifications are present. Subsegmental dependent atelectasis at both lung bases.  Hepatobiliary: The liver surface is diffusely irregular and there is asymmetric hypertrophy of the lateral left liver lobe, indicating cirrhosis. There is an ill-defined 2.9 x 2.2 cm liver mass within the central segment 8 right liver lobe (series 501/image 49), which demonstrates progressive heterogeneous enhancement. There is marked diffuse intrahepatic biliary ductal dilatation. There is an enhancing tumor thrombus at the main portal vein bifurcation extending into the proximal left and right portal vein branches. The gallbladder is contracted. The common bile duct is normal caliber (4 mm diameter). There are no additional liver masses.  Pancreas: No pancreatic mass. Ill-defined peripancreatic fluid throughout the length of the pancreas. No focal peripancreatic fluid collection. No main pancreatic duct dilation.  Spleen: Top normal size spleen (12.2 cm craniocaudal splenic length). No splenic mass.  Adrenals/Urinary Tract: Normal adrenals. Simple 1.0 cm posterior interpolar left renal cyst. Simple 1.1 cm exophytic lower left renal cyst. Additional subcentimeter hypodense left renal lesions, too small to characterize. No hydronephrosis. No nephrolithiasis. Visualized ureters are normal caliber. On delayed imaging, there is no urothelial wall thickening and there are no filling defects in the opacified portions of the bilateral collecting systems or visualized proximal ureters.  Stomach/Bowel: Relatively collapsed and grossly normal stomach normal caliber small bowel, with no appreciable small bowel wall thickening normal appendix. Normal large bowel, with no large bowel wall thickening.  Vascular/Lymphatic: Atherosclerotic nonaneurysmal abdominal aorta. Patent splenic, hepatic and renal veins. Enlarged 1.4 cm short axis gastrohepatic  ligament lymph node (series 501/ image 65). Mild porta hepatis lymphadenopathy.  Reproductive: Grossly normal uterus. Simple 2.3 cm left adnexal cystic structure (series 501/image 152).  Other: Small volume ascites.  No pneumoperitoneum.  Musculoskeletal: Partially visualized right total hip arthroplasty. Mild T10 and L1 vertebral body compression fractures and severe L2 vertebral body compression fracture of indeterminate chronicity. Mild to moderate degenerative changes in the visualized thoracolumbar spine. No aggressive appearing focal osseous lesions.  IMPRESSION: 1. 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 A cholangiocarcinoma ([place] tumor) is favored given the location and biliary ductal dilatation, however hepatocellular carcinoma is on the differential given the underlying cirrhosis and the presence of portal vein tumor thrombus. 2. Small volume ascites.  Top-normal size spleen. 3. Mild porta hepatis and gastrohepatic ligament lymphadenopathy, nonspecific. 4. Mild T10 and L1 and superior L2 vertebral body compression fractures of indeterminate chronicity, likely chronic. 5. Cardiomegaly. Atherosclerosis, including three-vessel coronary artery disease. Please note that although the presence of coronary artery calcium documents the presence of coronary artery disease, the severity of this disease and any potential stenosis cannot be assessed on this non-gated CT examination. 6. Simple benign-appearing 2.3 cm left adnexal cyst. No follow-up is required. This recommendation follows ACR consensus guidelines: White Paper of the ACR Incidental Findings Committee II on Adnexal Findings. J Am Coll Radiol [id number]. These results were called by telephone at the time of interpretation on 04/08/2015 at 8:55 am to Dr.  Nicoletta Ba , who verbally acknowledged these results.   Electronically Signed   By: Ilona Sorrel M.D.   On: 04/08/2015 09:06   US Abdomen Complete  04/06/2015   CLINICAL DATA:  Acute liver failure.  Jaundice.  EXAM: ULTRASOUND ABDOMEN COMPLETE  COMPARISON:  None.  FINDINGS: Gallbladder: Gallbladder is not visualized and may be surgically absent or contracted.  Common bile duct: Diameter: 5.3 mm, normal  Liver: Coarsened parenchymal echotexture with nodular contour suggesting hepatic cirrhosis. Prominence of portal veins with flow in the appropriate direction. Suggestion of mild intrahepatic bile duct dilatation. No focal liver lesions identified.  IVC: No abnormality visualized.  Pancreas: Visualized portion unremarkable.  Spleen: Spleen appears enlarged with homogeneous parenchymal echotexture.  Right Kidney: Length: 10.3 cm. Echogenicity within normal limits. No mass or hydronephrosis visualized.  Left Kidney: Length: 9.6 cm. Small cyst demonstrated in the lower pole measuring up to about 1 cm diameter. No hydronephrosis.  Abdominal aorta: No aneurysm visualized.  Other findings: None.  IMPRESSION: Coarsened liver echotexture with nodular contour and prominent pulmonary venous structures consistent with hepatic cirrhosis. No focal lesions identified. Suggestion of intrahepatic bile duct dilatation without extrahepatic ductal dilatation. Gallbladder not visualized. Probable splenic enlargement.   Electronically Signed   By: Lucienne Capers M.D.   On: 04/06/2015 23:51   [place] Art/ven Flow Abd Pelv Doppler  04/07/2015   CLINICAL DATA:  Jaundice, hepatic cirrhosis.  EXAM: DUPLEX ULTRASOUND OF LIVER  TECHNIQUE: Color and duplex Doppler ultrasound was performed to evaluate the hepatic in-flow and out-flow vessels. Exam is significantly limited as patient does not speak [place] and  unable to follow breathing instructions.  COMPARISON:  None.  FINDINGS: Portal Vein Velocities  Main: 18.1 cm/sec proximally ;  hepatopetal flow is noted proximally and in the midportion, but no flow is noted distally consistent with portal venous thrombus.  Right:  Not visualized due to limitations described above.  Left:  Not visualized due to limitations described above.  Hepatic Vein Velocities  Right:  20.5 cm/sec ; hepatofugal flow is noted.  Middle:  Not visualized due to limitations described above.  Left:  29.7 Cm/sec ; hepatofugal flow is noted.  Hepatic Artery Velocity:  146 cm/sec  Splenic Vein Velocity:  17.1 cm/sec  Varices: Present.  Ascites: Present.  IMPRESSION: Exam is significantly limited as patient does not speak English and unable to follow breathing instructions. There is probable occlusive thrombus seen in the distal portal vein. Middle hepatic vein is not visualized due to previously described limitations, but right and left hepatic veins demonstrate normal flow and velocity. These results will be called to the ordering clinician or representative by the Radiologist Assistant, and communication documented in the PACS or zVision Dashboard.   Electronically Signed   By: Marijo Conception, M.D.   On: 04/07/2015 08:12    Labs:  CBC:  Recent Labs  10/05/14 1310 04/06/15 1015 04/07/15 0523 04/08/15 0720  WBC 4.0 6.0 4.9 5.8  HGB 14.4 13.7 12.2 13.0  HCT 42.7 40.0 36.1 37.8  PLT 110.0* 174 155 170    COAGS:  Recent Labs  04/06/15 1405 04/06/15 2034 04/07/15 0523 04/08/15 0720  INR 1.44 1.42 1.47 1.59*  APTT 30  --   --   --     BMP:  Recent Labs  09/20/14 1221 12/20/14 1306 04/06/15 1015 04/07/15 0523 04/08/15 0720  NA 143  --  137 138 135  K 4.1  --  4.5 4.3 4.5  CL 107  --  105 106 103  CO2 29  --  24 25 25   GLUCOSE 76  --  92 127* 91  BUN 16 17 13 12 8   CALCIUM 9.8  --  8.6* 8.3* 8.3*  CREATININE 0.57 0.71 0.96 0.91 0.79  GFRNONAA  --   --  53* 57* >60  GFRAA  --   --  >60 >60 >60    LIVER FUNCTION TESTS:  Recent Labs  10/05/14 1310 04/06/15 1015 04/06/15 1124  04/07/15 0523 04/08/15 0720  BILITOT 0.6 23.5* 23.2* 20.0* 20.1*  AST 79* 159*  --  122* 131*  ALT 85* 93*  --  71* 72*  ALKPHOS 54 294*  --  245* 257*  PROT 7.2 6.5  --  6.0* 5.9*  ALBUMIN 4.0 2.7*  --  2.1* 2.0*    TUMOR MARKERS:  Recent Labs  04/07/15 1615  AFPTM 46.6*    Assessment and Plan:  Hep C/ cirrhosis New liver lesion +jaundice Biliary obstruction per imaging Now scheduled for percutaneous transhepatic biliary drain(s) placement Risks and Benefits discussed with the patient's daughter including bleeding, infection, damage to adjacent structures, and sepsis. All of her questions were answered, she is agreeable to proceed. Consent signed and in chart.   Thank you for this interesting consult.  I greatly enjoyed meeting Diana Pearson and look forward to participating in their care.  A copy of this report was sent to the requesting provider on this date.  Signed: Ioana Louks A 04/08/2015, 11:58 AM   I spent a total of 40 Minutes    in face to face in clinical  consultation, greater than 50% of which was counseling/coordinating care for Biliary drains

## 2015-04-09 ENCOUNTER — Observation Stay (HOSPITAL_COMMUNITY): Payer: Medicaid Other

## 2015-04-09 LAB — COMPREHENSIVE METABOLIC PANEL
ALBUMIN: 2.1 g/dL — AB (ref 3.5–5.0)
ALK PHOS: 290 U/L — AB (ref 38–126)
ALT: 84 U/L — AB (ref 14–54)
AST: 161 U/L — ABNORMAL HIGH (ref 15–41)
Anion gap: 9 (ref 5–15)
BUN: 9 mg/dL (ref 6–20)
CALCIUM: 8.3 mg/dL — AB (ref 8.9–10.3)
CO2: 24 mmol/L (ref 22–32)
CREATININE: 0.75 mg/dL (ref 0.44–1.00)
Chloride: 101 mmol/L (ref 101–111)
GFR calc non Af Amer: >60 mL/min (ref >60)
GLUCOSE: 94 mg/dL (ref 65–99)
Potassium: 4.5 mmol/L (ref 3.5–5.1)
SODIUM: 134 mmol/L — AB (ref 135–145)
Total Bilirubin: 21.4 mg/dL (ref 0.3–1.2)
Total Protein: 6.4 g/dL — ABNORMAL LOW (ref 6.5–8.1)

## 2015-04-09 LAB — CBC
HCT: 37.7 % (ref 36.0–46.0)
Hemoglobin: 13.2 g/dL (ref 12.0–15.0)
MCH: 31.3 pg (ref 26.0–34.0)
MCHC: 35 g/dL (ref 30.0–36.0)
MCV: 89.3 fL (ref 78.0–100.0)
PLATELETS: 177 10*3/uL (ref 150–400)
RBC: 4.22 MIL/uL (ref 3.87–5.11)
RDW: 17 % — AB (ref 11.5–15.5)
WBC: 7 10*3/uL (ref 4.0–10.5)

## 2015-04-09 MED ORDER — MIDAZOLAM HCL 2 MG/2ML IJ SOLN
INTRAMUSCULAR | Status: AC
  Filled 2015-04-09: qty 6

## 2015-04-09 MED ORDER — LIDOCAINE HCL 1 % IJ SOLN
INTRAMUSCULAR | Status: AC
  Filled 2015-04-09: qty 20

## 2015-04-09 MED ORDER — MIDAZOLAM HCL 2 MG/2ML IJ SOLN
INTRAMUSCULAR | Status: AC | PRN
  Administered 2015-04-09 (×4): 1 mg via INTRAVENOUS

## 2015-04-09 MED ORDER — HYDROMORPHONE HCL 1 MG/ML IJ SOLN
0.5000 mg | INTRAMUSCULAR | Status: DC | PRN
Start: 2015-04-09 — End: 2015-04-15
  Administered 2015-04-09 – 2015-04-15 (×4): 1 mg via INTRAVENOUS
  Filled 2015-04-09 (×5): qty 1

## 2015-04-09 MED ORDER — FENTANYL CITRATE (PF) 100 MCG/2ML IJ SOLN
INTRAMUSCULAR | Status: AC
  Filled 2015-04-09: qty 4

## 2015-04-09 MED ORDER — FENTANYL CITRATE (PF) 100 MCG/2ML IJ SOLN
INTRAMUSCULAR | Status: AC | PRN
  Administered 2015-04-09 (×4): 50 ug via INTRAVENOUS

## 2015-04-09 MED ORDER — SODIUM CHLORIDE 0.9 % IJ SOLN
INTRAMUSCULAR | Status: AC
  Filled 2015-04-09: qty 10

## 2015-04-09 MED ORDER — IOHEXOL 300 MG/ML  SOLN
50.0000 mL | Freq: Once | INTRAMUSCULAR | Status: DC | PRN
  Administered 2015-04-09: 35 mL via INTRAVENOUS
  Filled 2015-04-09: qty 50

## 2015-04-09 NOTE — Sedation Documentation (Addendum)
Successful biliary drain placed

## 2015-04-09 NOTE — Sedation Documentation (Signed)
On Call IV Zosyn hung pre-procedure

## 2015-04-09 NOTE — Procedures (Signed)
Interventional Radiology Procedure Note  Procedure:  Percutaneous Transhepatic cholangiogram, with external biliary drain placement.  Endoluminal biopsy was performed.  Complications: None Findings:  There is high grade stenosis of the common hepatic duct at the liver hilum, with moderate to severe intrahepatic biliary dilation of right and left ducts.   There is at least partial communication of the left and the right sided ducts.  47F pigtail standard drain into the biliary ducts.  Unable to cross the obstruction on today's study.   4 x Endoluminal pincer biopsy performed of the biliary system.    Recommendations:  - Ok for diet from standpoint of IR - BID flush of the biliary drain with 10cc normal saline.  Do not aspirate.  - Gravity drainage with record of output.  - Do not submerge - Return to IR on Monday or Tuesday of this week for repeat through the tube cholangiogram, tube exchange, and attempt at placement of the internal-external drain.  - Will follow  Signed,  Dulcy Fanny. Earleen Newport, DO

## 2015-04-09 NOTE — Progress Notes (Signed)
[facility] PROGRESS NOTE  Diana Pearson AQT:[id number] DOB: Jul 01, 1929 DOA: 04/06/2015 PCP: No PCP Per Patient  Assessment/Plan: Cholestatic jaundice/Liver mass -CT abd pelvis with 2.9x2.2cm mass in R liver, likely HCC, AFP 46, cholangiocarcinoma also possible -IR and GI following, plan for drains and possibly Biopsy today -Bili slightly up, mentation clear, no asterixis, PT/INR higher today -appreciate Gi input -statin stopped  -Acute hepatitis panel negative except HCV Ab -CMV IgM and IgG elevated, will d/w ID   Hypertension -stable, continue metoprolol.  Hypothyroidism Continue levothyroxine  Hyperlipidemia  stopped statin  due to liver failure.  Atrial fibrillation Rate controlled with metoprolol. Not on anticoagulation. She is followed by Dr. Dorris Carnes.  Aortic stenosis. Recent echo 4/16 shows severe stenosis of the aortic valve and mildly increased systolic pressure. No wall motion abnormalities. LV EF was 60-65%. FU with Cards  DVT proph: stopped heparin, since PT elevated from cirrhosis  Family Communication:daughter Diana Pearson at bedside Code Status: Full.  Dispo: home pending workup  Consultants: GI ID  HPI/Subjective: Feels ok, no complaints, tolerating diet, denies any pain now, awaiting procedures  Objective: Filed Vitals:   04/09/15 1305  BP: 164/64  Pulse: 70  Temp:   Resp: 16    Intake/Output Summary (Last 24 hours) at 04/09/15 1339 Last data filed at 04/09/15 0830  Gross per 24 hour  Intake    250 ml  Output      0 ml  Net    250 ml   Filed Weights   04/06/15 2020 04/07/15 2107  Weight: 52.7 kg (116 lb 2.9 oz) 51.8 kg (114 lb 3.2 oz)    Exam:   General:  AAOx3, profoundly icteric  Cardiovascular: T6Y5/WLS, systolic murmur  Respiratory: CTAB  Abdomen: soft, Nt, BS present  Musculoskeletal: no edema c/c   Data Reviewed: Basic Metabolic Panel:  Recent Labs Lab 04/06/15 1015 04/07/15 0523 04/08/15 0720 04/09/15 0535   NA 137 138 135 134*  K 4.5 4.3 4.5 4.5  CL 105 106 103 101  CO2 24 25 25 24   GLUCOSE 92 127* 91 94  BUN 13 12 8 9   CREATININE 0.96 0.91 0.79 0.75  CALCIUM 8.6* 8.3* 8.3* 8.3*   Liver Function Tests:  Recent Labs Lab 04/06/15 1015 04/06/15 1124 04/07/15 0523 04/08/15 0720 04/09/15 0535  AST 159*  --  122* 131* 161*  ALT 93*  --  71* 72* 84*  ALKPHOS 294*  --  245* 257* 290*  BILITOT 23.5* 23.2* 20.0* 20.1* 21.4*  PROT 6.5  --  6.0* 5.9* 6.4*  ALBUMIN 2.7*  --  2.1* 2.0* 2.1*   No results for input(s): LIPASE, AMYLASE in the last 168 hours. No results for input(s): AMMONIA in the last 168 hours. CBC:  Recent Labs Lab 04/06/15 1015 04/07/15 0523 04/08/15 0720 04/09/15 0535  WBC 6.0 4.9 5.8 7.0  NEUTROABS 4.0  --   --   --   HGB 13.7 12.2 13.0 13.2  HCT 40.0 36.1 37.8 37.7  MCV 92.8 90.7 91.1 89.3  PLT 174 155 170 177   Cardiac Enzymes: No results for input(s): CKTOTAL, CKMB, CKMBINDEX, TROPONINI in the last 168 hours. BNP (last 3 results) No results for input(s): BNP in the last 8760 hours.  ProBNP (last 3 results) No results for input(s): PROBNP in the last 8760 hours.  CBG: No results for input(s): GLUCAP in the last 168 hours.  No results found for this or any previous visit (from the past 240 hour(s)).   Studies:  Ct Abdomen Pelvis W Wo Contrast  04/08/2015   CLINICAL DATA:  81 year old female with hepatitis-C, jaundice, hypothyroidism and abdominal distention. Possible portal vein thrombosis on recent sonogram.  EXAM: CT ABDOMEN AND PELVIS WITHOUT AND WITH CONTRAST  TECHNIQUE: Multidetector CT imaging of the abdomen and pelvis was performed following the standard protocol before and following the bolus administration of intravenous contrast.  CONTRAST:  143mL OMNIPAQUE IOHEXOL 300 MG/ML  SOLN  COMPARISON:  04/07/2015 abdominal sonogram.  FINDINGS: Lower chest: Cardiomegaly. There is atherosclerosis of the thoracic aorta, the great vessels of the mediastinum  and the coronary arteries, including calcified atherosclerotic plaque in the left anterior descending, left circumflex and right coronary arteries. Aortic valvular and mitral annular calcifications are present. Subsegmental dependent atelectasis at both lung bases.  Hepatobiliary: The liver surface is diffusely irregular and there is asymmetric hypertrophy of the lateral left liver lobe, indicating cirrhosis. There is an ill-defined 2.9 x 2.2 cm liver mass within the central segment 8 right liver lobe (series 501/image 49), which demonstrates progressive heterogeneous enhancement. There is marked diffuse intrahepatic biliary ductal dilatation. There is an enhancing tumor thrombus at the main portal vein bifurcation extending into the proximal left and right portal vein branches. The gallbladder is contracted. The common bile duct is normal caliber (4 mm diameter). There are no additional liver masses.  Pancreas: No pancreatic mass. Ill-defined peripancreatic fluid throughout the length of the pancreas. No focal peripancreatic fluid collection. No main pancreatic duct dilation.  Spleen: Top normal size spleen (12.2 cm craniocaudal splenic length). No splenic mass.  Adrenals/Urinary Tract: Normal adrenals. Simple 1.0 cm posterior interpolar left renal cyst. Simple 1.1 cm exophytic lower left renal cyst. Additional subcentimeter hypodense left renal lesions, too small to characterize. No hydronephrosis. No nephrolithiasis. Visualized ureters are normal caliber. On delayed imaging, there is no urothelial wall thickening and there are no filling defects in the opacified portions of the bilateral collecting systems or visualized proximal ureters.  Stomach/Bowel: Relatively collapsed and grossly normal stomach normal caliber small bowel, with no appreciable small bowel wall thickening normal appendix. Normal large bowel, with no large bowel wall thickening.  Vascular/Lymphatic: Atherosclerotic nonaneurysmal abdominal  aorta. Patent splenic, hepatic and renal veins. Enlarged 1.4 cm short axis gastrohepatic ligament lymph node (series 501/ image 65). Mild porta hepatis lymphadenopathy.  Reproductive: Grossly normal uterus. Simple 2.3 cm left adnexal cystic structure (series 501/image 152).  Other: Small volume ascites.  No pneumoperitoneum.  Musculoskeletal: Partially visualized right total hip arthroplasty. Mild T10 and L1 vertebral body compression fractures and severe L2 vertebral body compression fracture of indeterminate chronicity. Mild to moderate degenerative changes in the visualized thoracolumbar spine. No aggressive appearing focal osseous lesions.  IMPRESSION: 1. 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 A cholangiocarcinoma ([place] tumor) is favored given the location and biliary ductal dilatation, however hepatocellular carcinoma is on the differential given the underlying cirrhosis and the presence of portal vein tumor thrombus. 2. Small volume ascites.  Top-normal size spleen. 3. Mild porta hepatis and gastrohepatic ligament lymphadenopathy, nonspecific. 4. Mild T10 and L1 and superior L2 vertebral body compression fractures of indeterminate chronicity, likely chronic. 5. Cardiomegaly. Atherosclerosis, including three-vessel coronary artery disease. Please note that although the presence of coronary artery calcium documents the presence of coronary artery disease, the severity of this disease and any potential stenosis cannot  be assessed on this non-gated CT examination. 6. Simple benign-appearing 2.3 cm left adnexal cyst. No follow-up is required. This recommendation follows ACR consensus guidelines: White Paper of the ACR Incidental Findings Committee II on Adnexal Findings. J Am Coll Radiol [phone]. These  results were called by telephone at the time of interpretation on 04/08/2015 at 8:55 am to Dr. Nicoletta Ba , who verbally acknowledged these results.   Electronically Signed   By: Ilona Sorrel M.D.   On: 04/08/2015 09:06    Scheduled Meds: . fentaNYL      . levothyroxine  88 mcg Oral QAC breakfast  . lidocaine      . metoprolol tartrate  12.5 mg Oral BID  . midazolam      . sodium chloride       Continuous Infusions:  Antibiotics Given (last 72 hours)    Date/Time Action Medication Dose Rate   04/09/15 0521 Given   piperacillin-tazobactam (ZOSYN) IVPB 3.375 g 3.375 g 12.5 mL/hr      Principal Problem:   Acute liver failure Active Problems:   Chronic hepatitis C without hepatic coma   Hepatic cirrhosis   Hypertension   Hypothyroidism   Jaundice   Cirrhosis   Liver mass    Time spent: 34min    Tejah Brekke  [facility] Pager [phone]. If 7PM-7AM, please contact night-coverage at [email or web address], password [facility] 04/09/2015, 1:39 PM  LOS: 3 days

## 2015-04-10 LAB — COMPREHENSIVE METABOLIC PANEL
ALT: 72 U/L — ABNORMAL HIGH (ref 14–54)
AST: 122 U/L — ABNORMAL HIGH (ref 15–41)
Albumin: 2.2 g/dL — ABNORMAL LOW (ref 3.5–5.0)
Alkaline Phosphatase: 244 U/L — ABNORMAL HIGH (ref 38–126)
Anion gap: 9 (ref 5–15)
BUN: 15 mg/dL (ref 6–20)
CO2: 24 mmol/L (ref 22–32)
Calcium: 8.3 mg/dL — ABNORMAL LOW (ref 8.9–10.3)
Chloride: 102 mmol/L (ref 101–111)
Creatinine, Ser: 1.01 mg/dL — ABNORMAL HIGH (ref 0.44–1.00)
GFR calc Af Amer: 58 mL/min — ABNORMAL LOW (ref >60)
GFR calc non Af Amer: 50 mL/min — ABNORMAL LOW (ref >60)
Glucose, Bld: 131 mg/dL — ABNORMAL HIGH (ref 65–99)
Potassium: 4.8 mmol/L (ref 3.5–5.1)
Sodium: 135 mmol/L (ref 135–145)
Total Bilirubin: 21.5 mg/dL (ref 0.3–1.2)
Total Protein: 6.4 g/dL — ABNORMAL LOW (ref 6.5–8.1)

## 2015-04-10 LAB — CBC
HCT: 39.9 % (ref 36.0–46.0)
HEMOGLOBIN: 13.9 g/dL (ref 12.0–15.0)
MCH: 31.7 pg (ref 26.0–34.0)
MCHC: 34.8 g/dL (ref 30.0–36.0)
MCV: 90.9 fL (ref 78.0–100.0)
PLATELETS: 204 10*3/uL (ref 150–400)
RBC: 4.39 MIL/uL (ref 3.87–5.11)
RDW: 17.2 % — ABNORMAL HIGH (ref 11.5–15.5)
WBC: 8.7 10*3/uL (ref 4.0–10.5)

## 2015-04-10 LAB — HEPATITIS E ANTIBODY, IGM

## 2015-04-10 MED ORDER — POLYETHYLENE GLYCOL 3350 17 G PO PACK
17.0000 g | PACK | Freq: Every day | ORAL | Status: DC
  Administered 2015-04-11 – 2015-04-15 (×3): 17 g via ORAL
  Filled 2015-04-10 (×3): qty 1

## 2015-04-10 MED ORDER — POLYETHYLENE GLYCOL 3350 17 G PO PACK: 17.0000 g | PACK | Freq: Two times a day (BID) | ORAL | Status: DC

## 2015-04-10 NOTE — Progress Notes (Signed)
CRITICAL VALUE ALERT  Critical value received:  Total bilirubin 21.5  Date of notification:  04/10/15  Time of notification:  1916  Critical value read back:Yes.    Nurse who received alert:  Dwaine Gale, RN  MD notified (1st page):  Dr. Broadus John  Time of first page:  60  MD notified (2nd page):  Time of second page:  Responding MD:  Dr. Broadus John  Time MD responded:  [id number]

## 2015-04-10 NOTE — Progress Notes (Signed)
[facility] PROGRESS NOTE  Diana Pearson SWH:[id number] DOB: Feb 05, 1929 DOA: 04/06/2015 PCP: No PCP Per Patient  Assessment/Plan: Cholestatic jaundice/Liver mass -CT abd pelvis with 2.9x2.2cm mass in R liver, likely HCC, AFP 46, cholangiocarcinoma also possible -IR and GI following, S/P Percutaneous Transhepatic cholangiogram, with external biliary drain placement and endoluminal biopsy by Dr. Earleen Newport on 04/09/15 -Bili stable, mentation clear, no asterixis, PT/INR stable -appreciate Gi input -statin stopped  -Acute hepatitis panel negative except HCV Ab -CMV IgM and IgG elevated, will d/w ID   Hypertension -stable, continue metoprolol.  Hypothyroidism Continue levothyroxine  Hyperlipidemia  stopped statin due to liver failure.  Atrial fibrillation Rate controlled with metoprolol. Not on anticoagulation. She is followed by Dr. Dorris Carnes.  Aortic stenosis. Recent echo 4/16 shows severe stenosis of the aortic valve and mildly increased systolic pressure. No wall motion abnormalities. LV EF was 60-65%. FU with Cards  DVT proph: stopped heparin, since PT elevated from cirrhosis  Family Communication:none today, d/wdaughter [name] at bedside Friday Code Status: Full.  Dispo: home pending workup  Consultants: GI ID  HPI/Subjective: Feels ok, mild abd distension,  tolerating diet, denies any pain now  Objective: Filed Vitals:   04/10/15 0931  BP: 104/58  Pulse: 64  Temp: 98.2 F (36.8 C)  Resp: 16    Intake/Output Summary (Last 24 hours) at 04/10/15 1434 Last data filed at 04/10/15 1236  Gross per 24 hour  Intake    605 ml  Output    400 ml  Net    205 ml   Filed Weights   04/06/15 2020 04/07/15 2107 04/09/15 2030  Weight: 52.7 kg (116 lb 2.9 oz) 51.8 kg (114 lb 3.2 oz) 52.6 kg (115 lb 15.4 oz)    Exam:   General:  AAOx3, profoundly icteric  Cardiovascular: Y6Z9/DJT, systolic murmur  Respiratory: CTAB  Abdomen: soft, Nt, BS  present  Musculoskeletal: no edema c/c   Data Reviewed: Basic Metabolic Panel:  Recent Labs Lab 04/06/15 1015 04/07/15 0523 04/08/15 0720 04/09/15 0535 04/10/15 1000  NA 137 138 135 134* 135  K 4.5 4.3 4.5 4.5 4.8  CL 105 106 103 101 102  CO2 24 25 25 24 24   GLUCOSE 92 127* 91 94 131*  BUN 13 12 8 9 15   CREATININE 0.96 0.91 0.79 0.75 1.01*  CALCIUM 8.6* 8.3* 8.3* 8.3* 8.3*   Liver Function Tests:  Recent Labs Lab 04/06/15 1015 04/06/15 1124 04/07/15 0523 04/08/15 0720 04/09/15 0535 04/10/15 1000  AST 159*  --  122* 131* 161* 122*  ALT 93*  --  71* 72* 84* 72*  ALKPHOS 294*  --  245* 257* 290* 244*  BILITOT 23.5* 23.2* 20.0* 20.1* 21.4* 21.5*  PROT 6.5  --  6.0* 5.9* 6.4* 6.4*  ALBUMIN 2.7*  --  2.1* 2.0* 2.1* 2.2*   No results for input(s): LIPASE, AMYLASE in the last 168 hours. No results for input(s): AMMONIA in the last 168 hours. CBC:  Recent Labs Lab 04/06/15 1015 04/07/15 0523 04/08/15 0720 04/09/15 0535 04/10/15 1000  WBC 6.0 4.9 5.8 7.0 8.7  NEUTROABS 4.0  --   --   --   --   HGB 13.7 12.2 13.0 13.2 13.9  HCT 40.0 36.1 37.8 37.7 39.9  MCV 92.8 90.7 91.1 89.3 90.9  PLT 174 155 170 177 204   Cardiac Enzymes: No results for input(s): CKTOTAL, CKMB, CKMBINDEX, TROPONINI in the last 168 hours. BNP (last 3 results) No results for input(s): BNP in the last  8760 hours.  ProBNP (last 3 results) No results for input(s): PROBNP in the last 8760 hours.  CBG: No results for input(s): GLUCAP in the last 168 hours.  No results found for this or any previous visit (from the past 240 hour(s)).   Studies: Ir Biliary Drain Placement With Cholangiogram  04/09/2015   CLINICAL DATA:  81 year old female presents with jaundice, hyperbilirubinemia, and imaging diagnosis of hilar tumor in the liver. CT demonstrates intrahepatic biliary ductal dilatation.  She has been referred for percutaneous transhepatic cholangiogram and drainage.  EXAM: IR BILIARY DRAIN  PLACEMENT W/ CHOLANGIOGRAM; IR ENDOLUMNICAL BIOPSY OF BILIARY TREE  COMPARISON:  CT 04/07/2015  ANESTHESIA/SEDATION: 4.0 IV Versed; 2 high mcg IV Fentanyl.  Total Moderate Sedation Time: 45 minutes.  CONTRAST:  76mL OMNIPAQUE IOHEXOL 300 MG/ML  SOLN  MEDICATIONS: Receiving Zosyn  Procedural  FLUOROSCOPY TIME:  18 minutes 36 second  TECHNIQUE: The procedure, risks, benefits, and alternatives were explained to the patient and the patient's family. A complete informed consent was performed, with risk benefit analysis. Specific risks that were discussed for the procedure include bleeding, infection, biliary sepsis, IC use day, organ injury, need for further procedure, need for further surgery, long-term drain placement, cardiopulmonary collapse, death. Questions regarding the procedure were encouraged and answered. The patient understands and consents to the procedure.  Patient is position in supine position on the fluoroscopy table, and the upper abdomen was prepped and draped in the usual sterile fashion. Maximum barrier sterile technique with sterile gowns and gloves were used for the procedure. A timeout was performed prior to the initiation of the procedure. Local anesthesia was provided with 1% lidocaine with epinephrine.  Ultrasound survey of the right liver lobe was performed. Window into the right biliary system was present for the right liver approach.  1% lidocaine was used for local anesthesia, with generous infiltration of the skin and subcutaneous tissues in and inter left costal location. A Chiba needle was advanced under ultrasound guidance into the right liver lobe.  Once the tip of the needle was confirmed within the biliary system by injecting small aliquots of contrast, images were stored of the biliary system after partially opacifying the biliary tree via the needle.  Once the tip of this needle was confirmed within the biliary system, an 018 wire was advanced centrally. The needle was removed, a  small incision was made with an 11 blade scalpel.  An Accustick system would not advance into the biliary system, and [place] a micropuncture system was advanced over the 018 wire into the biliary tree. The inner dilator and wire were removed, and an 035 catheter was advanced into the biliary system. A standard 6 French sheath was then placed in the biliary system.  Cholangiogram was then performed.  A combination of Glidewire, 4 [place] glide cath, 5 [place] cobra catheter, as well as a 6 [place] beacon tip 35 cm sheath used to attempt to navigate through the obstruction with no success.  A standard pigtail 10 French drainage catheter was placed over the [employer].  We confirmed placement with contrast infusion.  The patient tolerated the procedure well and remained hemodynamically stable throughout.  No complications were encountered and no significant blood loss was encountered.  COMPLICATIONS: None  FINDINGS: Ultrasound survey demonstrates dilated intrahepatic biliary ducts. Approach into the right liver lobe was identified.  Percutaneous transhepatic cholangiogram demonstrates moderate to severely dilated intrahepatic biliary ducts. There is communication of the left and right-sided ducts, and the os a right  and left drain may not be required for drainage.  Dilated ducts of the segment 4B should not be confused for common hepatic duct. Common hepatic duct appears to be closer to the patient's right side.  Small amount of contrast did opacified I the common hepatic duct and traversed the common bile duct into the duodenum. Cystic duct opacified with incomplete filling of the gallbladder.  Unable to traverse the obstruction from our initial puncture.  IMPRESSION: Status post percutaneous transhepatic cholangiogram with demonstration of high-grade stenosis of the common hepatic duct, and subsequent placement of externalized standard 10 French pigtail catheter drain.  Communication of left and right-sided ducts was  demonstrated.  Signed,  Dulcy Fanny. Earleen Newport, DO  [facility]  [facility] Radiology  PLAN: Would recommend repeat through the tube cholangiogram on Monday or Tuesday of the upcoming week for drainage catheter exchange and a second attempt at traversing the obstruction.  Twice a day 10 cc saline flushes until drain exchange.   Electronically Signed   By: Corrie Mckusick D.O.   On: 04/09/2015 13:51   Ir Endoluminal Bx Of Biliary Tree  04/09/2015   CLINICAL DATA:  81 year old female presents with jaundice, hyperbilirubinemia, and imaging diagnosis of hilar tumor in the liver. CT demonstrates intrahepatic biliary ductal dilatation.  She has been referred for percutaneous transhepatic cholangiogram and drainage.  EXAM: IR BILIARY DRAIN PLACEMENT W/ CHOLANGIOGRAM; IR ENDOLUMNICAL BIOPSY OF BILIARY TREE  COMPARISON:  CT 04/07/2015  ANESTHESIA/SEDATION: 4.0 IV Versed; 2 high mcg IV Fentanyl.  Total Moderate Sedation Time: 45 minutes.  CONTRAST:  19mL OMNIPAQUE IOHEXOL 300 MG/ML  SOLN  MEDICATIONS: Receiving Zosyn  Procedural  FLUOROSCOPY TIME:  18 minutes 36 second  TECHNIQUE: The procedure, risks, benefits, and alternatives were explained to the patient and the patient's family. A complete informed consent was performed, with risk benefit analysis. Specific risks that were discussed for the procedure include bleeding, infection, biliary sepsis, IC use day, organ injury, need for further procedure, need for further surgery, long-term drain placement, cardiopulmonary collapse, death. Questions regarding the procedure were encouraged and answered. The patient understands and consents to the procedure.  Patient is position in supine position on the fluoroscopy table, and the upper abdomen was prepped and draped in the usual sterile fashion. Maximum barrier sterile technique with sterile gowns and gloves were used for the procedure. A timeout was performed prior to the initiation of the  procedure. Local anesthesia was provided with 1% lidocaine with epinephrine.  Ultrasound survey of the right liver lobe was performed. Window into the right biliary system was present for the right liver approach.  1% lidocaine was used for local anesthesia, with generous infiltration of the skin and subcutaneous tissues in and inter left costal location. A Chiba needle was advanced under ultrasound guidance into the right liver lobe.  Once the tip of the needle was confirmed within the biliary system by injecting small aliquots of contrast, images were stored of the biliary system after partially opacifying the biliary tree via the needle.  Once the tip of this needle was confirmed within the biliary system, an 018 wire was advanced centrally. The needle was removed, a small incision was made with an 11 blade scalpel.  An Accustick system would not advance into the biliary system, and [place] a micropuncture system was advanced over the 018 wire into the biliary tree. The inner dilator and wire were removed, and an 035 catheter was advanced into the biliary system. A standard 6  French sheath was then placed in the biliary system.  Cholangiogram was then performed.  A combination of Glidewire, 4 [place] glide cath, 5 [place] cobra catheter, as well as a 6 [place] beacon tip 35 cm sheath used to attempt to navigate through the obstruction with no success.  A standard pigtail 10 French drainage catheter was placed over the [employer].  We confirmed placement with contrast infusion.  The patient tolerated the procedure well and remained hemodynamically stable throughout.  No complications were encountered and no significant blood loss was encountered.  COMPLICATIONS: None  FINDINGS: Ultrasound survey demonstrates dilated intrahepatic biliary ducts. Approach into the right liver lobe was identified.  Percutaneous transhepatic cholangiogram demonstrates moderate to severely dilated intrahepatic biliary ducts. There is  communication of the left and right-sided ducts, and the os a right and left drain may not be required for drainage.  Dilated ducts of the segment 4B should not be confused for common hepatic duct. Common hepatic duct appears to be closer to the patient's right side.  Small amount of contrast did opacified I the common hepatic duct and traversed the common bile duct into the duodenum. Cystic duct opacified with incomplete filling of the gallbladder.  Unable to traverse the obstruction from our initial puncture.  IMPRESSION: Status post percutaneous transhepatic cholangiogram with demonstration of high-grade stenosis of the common hepatic duct, and subsequent placement of externalized standard 10 French pigtail catheter drain.  Communication of left and right-sided ducts was demonstrated.  Signed,  Dulcy Fanny. Earleen Newport, DO  [facility]  [facility] Radiology  PLAN: Would recommend repeat through the tube cholangiogram on Monday or Tuesday of the upcoming week for drainage catheter exchange and a second attempt at traversing the obstruction.  Twice a day 10 cc saline flushes until drain exchange.   Electronically Signed   By: Corrie Mckusick D.O.   On: 04/09/2015 13:51    Scheduled Meds: . levothyroxine  88 mcg Oral QAC breakfast  . metoprolol tartrate  12.5 mg Oral BID  . [START ON 04/11/2015] polyethylene glycol  17 g Oral Daily   Continuous Infusions:  Antibiotics Given (last 72 hours)    Date/Time Action Medication Dose Rate   04/09/15 0521 Given   piperacillin-tazobactam (ZOSYN) IVPB 3.375 g 3.375 g 12.5 mL/hr      Principal Problem:   Acute liver failure Active Problems:   Chronic hepatitis C without hepatic coma   Hepatic cirrhosis   Hypertension   Hypothyroidism   Jaundice   Cirrhosis   Liver mass    Time spent: 9min    Diana Pearson  [facility] Pager [phone]. If 7PM-7AM, please contact night-coverage at [email or web address], password  [facility] 04/10/2015, 2:34 PM  LOS: 4 days

## 2015-04-10 NOTE — Progress Notes (Signed)
Referring Physician(s): Dr. [place] Cellar  Chief Complaint:  S/P Percutaneous Transhepatic cholangiogram, with external biliary drain placement and endoluminal biopsy by Dr. Earleen Newport on 04/09/15.   Subjective:  [name] is doing ok today.  There is a language barrier but she does confirm that she is feeling better today.  Allergies: Review of patient's allergies indicates no known allergies.  Medications: Prior to Admission medications   Medication Sig Start Date End Date Taking? Authorizing Provider  levothyroxine (SYNTHROID, LEVOTHROID) 88 MCG tablet Take 88 mcg by mouth daily before breakfast.   Yes Historical Provider, MD  metoprolol tartrate (LOPRESSOR) 25 MG tablet Take 0.5 tablets (12.5 mg total) by mouth 2 (two) times daily. 10/11/14  Yes Fay Records, MD  pravastatin (PRAVACHOL) 20 MG tablet Take 1 tablet (20 mg total) by mouth daily. 10/11/14  Yes Fay Records, MD  ondansetron (ZOFRAN) 4 MG tablet Take 1 tablet (4 mg total) by mouth every 8 (eight) hours as needed for nausea or vomiting. Patient not taking: Reported on 04/06/2015 03/24/15   Thayer Headings, MD     Vital Signs: BP 104/58 mmHg  Pulse 64  Temp(Src) 98.2 F (36.8 C) (Oral)  Resp 16  Wt 115 lb 15.4 oz (52.6 kg)  SpO2 96%  Physical Exam   Awake and Alert Abdomen Soft, NT Drain in place, looks good. ~200 mls blood tinged bilious drainage.   Imaging: Ct Abdomen Pelvis W Wo Contrast  04/08/2015   CLINICAL DATA:  81 year old female with hepatitis-C, jaundice, hypothyroidism and abdominal distention. Possible portal vein thrombosis on recent sonogram.  EXAM: CT ABDOMEN AND PELVIS WITHOUT AND WITH CONTRAST  TECHNIQUE: Multidetector CT imaging of the abdomen and pelvis was performed following the standard protocol before and following the bolus administration of intravenous contrast.  CONTRAST:  158mL OMNIPAQUE IOHEXOL 300 MG/ML  SOLN  COMPARISON:  04/07/2015 abdominal sonogram.  FINDINGS: Lower chest:  Cardiomegaly. There is atherosclerosis of the thoracic aorta, the great vessels of the mediastinum and the coronary arteries, including calcified atherosclerotic plaque in the left anterior descending, left circumflex and right coronary arteries. Aortic valvular and mitral annular calcifications are present. Subsegmental dependent atelectasis at both lung bases.  Hepatobiliary: The liver surface is diffusely irregular and there is asymmetric hypertrophy of the lateral left liver lobe, indicating cirrhosis. There is an ill-defined 2.9 x 2.2 cm liver mass within the central segment 8 right liver lobe (series 501/image 49), which demonstrates progressive heterogeneous enhancement. There is marked diffuse intrahepatic biliary ductal dilatation. There is an enhancing tumor thrombus at the main portal vein bifurcation extending into the proximal left and right portal vein branches. The gallbladder is contracted. The common bile duct is normal caliber (4 mm diameter). There are no additional liver masses.  Pancreas: No pancreatic mass. Ill-defined peripancreatic fluid throughout the length of the pancreas. No focal peripancreatic fluid collection. No main pancreatic duct dilation.  Spleen: Top normal size spleen (12.2 cm craniocaudal splenic length). No splenic mass.  Adrenals/Urinary Tract: Normal adrenals. Simple 1.0 cm posterior interpolar left renal cyst. Simple 1.1 cm exophytic lower left renal cyst. Additional subcentimeter hypodense left renal lesions, too small to characterize. No hydronephrosis. No nephrolithiasis. Visualized ureters are normal caliber. On delayed imaging, there is no urothelial wall thickening and there are no filling defects in the opacified portions of the bilateral collecting systems or visualized proximal ureters.  Stomach/Bowel: Relatively collapsed and grossly normal stomach normal caliber small bowel, with no appreciable small bowel wall thickening  normal appendix. Normal large bowel, with  no large bowel wall thickening.  Vascular/Lymphatic: Atherosclerotic nonaneurysmal abdominal aorta. Patent splenic, hepatic and renal veins. Enlarged 1.4 cm short axis gastrohepatic ligament lymph node (series 501/ image 65). Mild porta hepatis lymphadenopathy.  Reproductive: Grossly normal uterus. Simple 2.3 cm left adnexal cystic structure (series 501/image 152).  Other: Small volume ascites.  No pneumoperitoneum.  Musculoskeletal: Partially visualized right total hip arthroplasty. Mild T10 and L1 vertebral body compression fractures and severe L2 vertebral body compression fracture of indeterminate chronicity. Mild to moderate degenerative changes in the visualized thoracolumbar spine. No aggressive appearing focal osseous lesions.  IMPRESSION: 1. 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 A cholangiocarcinoma ([place] tumor) is favored given the location and biliary ductal dilatation, however hepatocellular carcinoma is on the differential given the underlying cirrhosis and the presence of portal vein tumor thrombus. 2. Small volume ascites.  Top-normal size spleen. 3. Mild porta hepatis and gastrohepatic ligament lymphadenopathy, nonspecific. 4. Mild T10 and L1 and superior L2 vertebral body compression fractures of indeterminate chronicity, likely chronic. 5. Cardiomegaly. Atherosclerosis, including three-vessel coronary artery disease. Please note that although the presence of coronary artery calcium documents the presence of coronary artery disease, the severity of this disease and any potential stenosis cannot be assessed on this non-gated CT examination. 6. Simple benign-appearing 2.3 cm left adnexal cyst. No follow-up is required. This recommendation follows ACR consensus guidelines: White Paper of the ACR  Incidental Findings Committee II on Adnexal Findings. J Am Coll Radiol [id number]. These results were called by telephone at the time of interpretation on 04/08/2015 at 8:55 am to Dr. Nicoletta Ba , who verbally acknowledged these results.   Electronically Signed   By: Ilona Sorrel M.D.   On: 04/08/2015 09:06   US Abdomen Complete  04/06/2015   CLINICAL DATA:  Acute liver failure.  Jaundice.  EXAM: ULTRASOUND ABDOMEN COMPLETE  COMPARISON:  None.  FINDINGS: Gallbladder: Gallbladder is not visualized and may be surgically absent or contracted.  Common bile duct: Diameter: 5.3 mm, normal  Liver: Coarsened parenchymal echotexture with nodular contour suggesting hepatic cirrhosis. Prominence of portal veins with flow in the appropriate direction. Suggestion of mild intrahepatic bile duct dilatation. No focal liver lesions identified.  IVC: No abnormality visualized.  Pancreas: Visualized portion unremarkable.  Spleen: Spleen appears enlarged with homogeneous parenchymal echotexture.  Right Kidney: Length: 10.3 cm. Echogenicity within normal limits. No mass or hydronephrosis visualized.  Left Kidney: Length: 9.6 cm. Small cyst demonstrated in the lower pole measuring up to about 1 cm diameter. No hydronephrosis.  Abdominal aorta: No aneurysm visualized.  Other findings: None.  IMPRESSION: Coarsened liver echotexture with nodular contour and prominent pulmonary venous structures consistent with hepatic cirrhosis. No focal lesions identified. Suggestion of intrahepatic bile duct dilatation without extrahepatic ductal dilatation. Gallbladder not visualized. Probable splenic enlargement.   Electronically Signed   By: Lucienne Capers M.D.   On: 04/06/2015 23:51   [place] Art/ven Flow Abd Pelv Doppler  04/07/2015   CLINICAL DATA:  Jaundice, hepatic cirrhosis.  EXAM: DUPLEX ULTRASOUND OF LIVER  TECHNIQUE: Color and duplex Doppler ultrasound was performed to evaluate the hepatic in-flow and out-flow vessels. Exam is  significantly limited as patient does not speak English and unable to follow breathing instructions.  COMPARISON:  None.  FINDINGS: Portal Vein Velocities  Main: 18.1  cm/sec proximally ; hepatopetal flow is noted proximally and in the midportion, but no flow is noted distally consistent with portal venous thrombus.  Right:  Not visualized due to limitations described above.  Left:  Not visualized due to limitations described above.  Hepatic Vein Velocities  Right:  20.5 cm/sec ; hepatofugal flow is noted.  Middle:  Not visualized due to limitations described above.  Left:  29.7 Cm/sec ; hepatofugal flow is noted.  Hepatic Artery Velocity:  146 cm/sec  Splenic Vein Velocity:  17.1 cm/sec  Varices: Present.  Ascites: Present.  IMPRESSION: Exam is significantly limited as patient does not speak English and unable to follow breathing instructions. There is probable occlusive thrombus seen in the distal portal vein. Middle hepatic vein is not visualized due to previously described limitations, but right and left hepatic veins demonstrate normal flow and velocity. These results will be called to the ordering clinician or representative by the Radiologist Assistant, and communication documented in the PACS or zVision Dashboard.   Electronically Signed   By: Marijo Conception, M.D.   On: 04/07/2015 08:12   Ir Biliary Drain Placement With Cholangiogram  04/09/2015   CLINICAL DATA:  81 year old female presents with jaundice, hyperbilirubinemia, and imaging diagnosis of hilar tumor in the liver. CT demonstrates intrahepatic biliary ductal dilatation.  She has been referred for percutaneous transhepatic cholangiogram and drainage.  EXAM: IR BILIARY DRAIN PLACEMENT W/ CHOLANGIOGRAM; IR ENDOLUMNICAL BIOPSY OF BILIARY TREE  COMPARISON:  CT 04/07/2015  ANESTHESIA/SEDATION: 4.0 IV Versed; 2 high mcg IV Fentanyl.  Total Moderate Sedation Time: 45 minutes.  CONTRAST:  38mL OMNIPAQUE IOHEXOL 300 MG/ML  SOLN  MEDICATIONS: Receiving  Zosyn  Procedural  FLUOROSCOPY TIME:  18 minutes 36 second  TECHNIQUE: The procedure, risks, benefits, and alternatives were explained to the patient and the patient's family. A complete informed consent was performed, with risk benefit analysis. Specific risks that were discussed for the procedure include bleeding, infection, biliary sepsis, IC use day, organ injury, need for further procedure, need for further surgery, long-term drain placement, cardiopulmonary collapse, death. Questions regarding the procedure were encouraged and answered. The patient understands and consents to the procedure.  Patient is position in supine position on the fluoroscopy table, and the upper abdomen was prepped and draped in the usual sterile fashion. Maximum barrier sterile technique with sterile gowns and gloves were used for the procedure. A timeout was performed prior to the initiation of the procedure. Local anesthesia was provided with 1% lidocaine with epinephrine.  Ultrasound survey of the right liver lobe was performed. Window into the right biliary system was present for the right liver approach.  1% lidocaine was used for local anesthesia, with generous infiltration of the skin and subcutaneous tissues in and inter left costal location. A Chiba needle was advanced under ultrasound guidance into the right liver lobe.  Once the tip of the needle was confirmed within the biliary system by injecting small aliquots of contrast, images were stored of the biliary system after partially opacifying the biliary tree via the needle.  Once the tip of this needle was confirmed within the biliary system, an 018 wire was advanced centrally. The needle was removed, a small incision was made with an 11 blade scalpel.  An Accustick system would not advance into the biliary system, and [place] a micropuncture system was advanced over the 018 wire into the biliary tree. The inner dilator and wire were removed, and an 035 catheter was advanced  into the biliary  system. A standard 6 French sheath was then placed in the biliary system.  Cholangiogram was then performed.  A combination of Glidewire, 4 [place] glide cath, 5 [place] cobra catheter, as well as a 6 [place] beacon tip 35 cm sheath used to attempt to navigate through the obstruction with no success.  A standard pigtail 10 French drainage catheter was placed over the [employer].  We confirmed placement with contrast infusion.  The patient tolerated the procedure well and remained hemodynamically stable throughout.  No complications were encountered and no significant blood loss was encountered.  COMPLICATIONS: None  FINDINGS: Ultrasound survey demonstrates dilated intrahepatic biliary ducts. Approach into the right liver lobe was identified.  Percutaneous transhepatic cholangiogram demonstrates moderate to severely dilated intrahepatic biliary ducts. There is communication of the left and right-sided ducts, and the os a right and left drain may not be required for drainage.  Dilated ducts of the segment 4B should not be confused for common hepatic duct. Common hepatic duct appears to be closer to the patient's right side.  Small amount of contrast did opacified I the common hepatic duct and traversed the common bile duct into the duodenum. Cystic duct opacified with incomplete filling of the gallbladder.  Unable to traverse the obstruction from our initial puncture.  IMPRESSION: Status post percutaneous transhepatic cholangiogram with demonstration of high-grade stenosis of the common hepatic duct, and subsequent placement of externalized standard 10 French pigtail catheter drain.  Communication of left and right-sided ducts was demonstrated.  Signed,  Dulcy Fanny. Earleen Newport, DO  [facility]  [facility]  PLAN: Would recommend repeat through the tube cholangiogram on Monday or Tuesday of the upcoming week for drainage catheter exchange and a second attempt at  traversing the obstruction.  Twice a day 10 cc saline flushes until drain exchange.   Electronically Signed   By: Corrie Mckusick D.O.   On: 04/09/2015 13:51   Ir Endoluminal Bx Of Biliary Tree  04/09/2015   CLINICAL DATA:  81 year old female presents with jaundice, hyperbilirubinemia, and imaging diagnosis of hilar tumor in the liver. CT demonstrates intrahepatic biliary ductal dilatation.  She has been referred for percutaneous transhepatic cholangiogram and drainage.  EXAM: IR BILIARY DRAIN PLACEMENT W/ CHOLANGIOGRAM; IR ENDOLUMNICAL BIOPSY OF BILIARY TREE  COMPARISON:  CT 04/07/2015  ANESTHESIA/SEDATION: 4.0 IV Versed; 2 high mcg IV Fentanyl.  Total Moderate Sedation Time: 45 minutes.  CONTRAST:  83mL OMNIPAQUE IOHEXOL 300 MG/ML  SOLN  MEDICATIONS: Receiving Zosyn  Procedural  FLUOROSCOPY TIME:  18 minutes 36 second  TECHNIQUE: The procedure, risks, benefits, and alternatives were explained to the patient and the patient's family. A complete informed consent was performed, with risk benefit analysis. Specific risks that were discussed for the procedure include bleeding, infection, biliary sepsis, IC use day, organ injury, need for further procedure, need for further surgery, long-term drain placement, cardiopulmonary collapse, death. Questions regarding the procedure were encouraged and answered. The patient understands and consents to the procedure.  Patient is position in supine position on the fluoroscopy table, and the upper abdomen was prepped and draped in the usual sterile fashion. Maximum barrier sterile technique with sterile gowns and gloves were used for the procedure. A timeout was performed prior to the initiation of the procedure. Local anesthesia was provided with 1% lidocaine with epinephrine.  Ultrasound survey of the right liver lobe was performed. Window into the right biliary system was present for the right liver approach.  1% lidocaine was used for local  anesthesia, with generous  infiltration of the skin and subcutaneous tissues in and inter left costal location. A Chiba needle was advanced under ultrasound guidance into the right liver lobe.  Once the tip of the needle was confirmed within the biliary system by injecting small aliquots of contrast, images were stored of the biliary system after partially opacifying the biliary tree via the needle.  Once the tip of this needle was confirmed within the biliary system, an 018 wire was advanced centrally. The needle was removed, a small incision was made with an 11 blade scalpel.  An Accustick system would not advance into the biliary system, and [place] a micropuncture system was advanced over the 018 wire into the biliary tree. The inner dilator and wire were removed, and an 035 catheter was advanced into the biliary system. A standard 6 French sheath was then placed in the biliary system.  Cholangiogram was then performed.  A combination of Glidewire, 4 [place] glide cath, 5 [place] cobra catheter, as well as a 6 [place] beacon tip 35 cm sheath used to attempt to navigate through the obstruction with no success.  A standard pigtail 10 French drainage catheter was placed over the [employer].  We confirmed placement with contrast infusion.  The patient tolerated the procedure well and remained hemodynamically stable throughout.  No complications were encountered and no significant blood loss was encountered.  COMPLICATIONS: None  FINDINGS: Ultrasound survey demonstrates dilated intrahepatic biliary ducts. Approach into the right liver lobe was identified.  Percutaneous transhepatic cholangiogram demonstrates moderate to severely dilated intrahepatic biliary ducts. There is communication of the left and right-sided ducts, and the os a right and left drain may not be required for drainage.  Dilated ducts of the segment 4B should not be confused for common hepatic duct. Common hepatic duct appears to be closer to the patient's right side.  Small amount  of contrast did opacified I the common hepatic duct and traversed the common bile duct into the duodenum. Cystic duct opacified with incomplete filling of the gallbladder.  Unable to traverse the obstruction from our initial puncture.  IMPRESSION: Status post percutaneous transhepatic cholangiogram with demonstration of high-grade stenosis of the common hepatic duct, and subsequent placement of externalized standard 10 French pigtail catheter drain.  Communication of left and right-sided ducts was demonstrated.  Signed,  Dulcy Fanny. Earleen Newport, DO  [facility]  [facility] Radiology  PLAN: Would recommend repeat through the tube cholangiogram on Monday or Tuesday of the upcoming week for drainage catheter exchange and a second attempt at traversing the obstruction.  Twice a day 10 cc saline flushes until drain exchange.   Electronically Signed   By: Corrie Mckusick D.O.   On: 04/09/2015 13:51    Labs:  CBC:  Recent Labs  04/07/15 0523 04/08/15 0720 04/09/15 0535 04/10/15 1000  WBC 4.9 5.8 7.0 8.7  HGB 12.2 13.0 13.2 13.9  HCT 36.1 37.8 37.7 39.9  PLT 155 170 177 204    COAGS:  Recent Labs  04/06/15 1405 04/06/15 2034 04/07/15 0523 04/08/15 0720 04/08/15 1316  INR 1.44 1.42 1.47 1.59*  --   APTT 30  --   --   --  34    BMP:  Recent Labs  04/07/15 0523 04/08/15 0720 04/09/15 0535 04/10/15 1000  NA 138 135 134* 135  K 4.3 4.5 4.5 4.8  CL 106 103 101 102  CO2 25 25 24 24   GLUCOSE 127* 91 94 131*  BUN  12 8 9 15   CALCIUM 8.3* 8.3* 8.3* 8.3*  CREATININE 0.91 0.79 0.75 1.01*  GFRNONAA 57* >60 >60 50*  GFRAA >60 >60 >60 58*    LIVER FUNCTION TESTS:  Recent Labs  04/07/15 0523 04/08/15 0720 04/09/15 0535 04/10/15 1000  BILITOT 20.0* 20.1* 21.4* 21.5*  AST 122* 131* 161* 122*  ALT 71* 72* 84* 72*  ALKPHOS 245* 257* 290* 244*  PROT 6.0* 5.9* 6.4* 6.4*  ALBUMIN 2.1* 2.0* 2.1* 2.2*    Assessment and Plan:  S/P Percutaneous  Transhepatic cholangiogram, with external biliary drain placement and endoluminal biopsyby Dr. Earleen Newport 04/09/15  - BID flush of the biliary drain with 10cc normal saline. Do not aspirate.  - Gravity drainage with record of output.  - Do not submerge - Return to IR on Monday or Tuesday of this week for repeat through the tube cholangiogram, tube exchange, and attempt at placement of the internal-external drain.  - Will follow   Signed: [facility] Tykeisha Peer PA-C 04/10/2015, 11:33 AM   I spent a total of 15 Minutes at the the patient's bedside AND on the patient's hospital floor or unit, greater than 50% of which was counseling/coordinating care for perc chole.

## 2015-04-10 NOTE — Progress Notes (Signed)
Used [employer] to communicate with patient. Discussed scheduled medications, plan of care, answered any questions, and also completed head to toe assessment.   Tina Gruner Eileen,RN.

## 2015-04-11 LAB — COMPREHENSIVE METABOLIC PANEL
ALT: 57 U/L — ABNORMAL HIGH (ref 14–54)
ANION GAP: 6 (ref 5–15)
AST: 76 U/L — AB (ref 15–41)
Albumin: 2 g/dL — ABNORMAL LOW (ref 3.5–5.0)
Alkaline Phosphatase: 194 U/L — ABNORMAL HIGH (ref 38–126)
BUN: 13 mg/dL (ref 6–20)
CHLORIDE: 102 mmol/L (ref 101–111)
CO2: 25 mmol/L (ref 22–32)
Calcium: 7.9 mg/dL — ABNORMAL LOW (ref 8.9–10.3)
Creatinine, Ser: 0.8 mg/dL (ref 0.44–1.00)
GFR calc Af Amer: >60 mL/min (ref >60)
Glucose, Bld: 96 mg/dL (ref 65–99)
POTASSIUM: 4.5 mmol/L (ref 3.5–5.1)
Sodium: 133 mmol/L — ABNORMAL LOW (ref 135–145)
Total Bilirubin: 15.3 mg/dL — ABNORMAL HIGH (ref 0.3–1.2)
Total Protein: 6 g/dL — ABNORMAL LOW (ref 6.5–8.1)

## 2015-04-11 LAB — CBC
HEMATOCRIT: 37.1 % (ref 36.0–46.0)
HEMOGLOBIN: 12.9 g/dL (ref 12.0–15.0)
MCH: 31.2 pg (ref 26.0–34.0)
MCHC: 34.8 g/dL (ref 30.0–36.0)
MCV: 89.6 fL (ref 78.0–100.0)
PLATELETS: 172 10*3/uL (ref 150–400)
RBC: 4.14 MIL/uL (ref 3.87–5.11)
RDW: 17.8 % — AB (ref 11.5–15.5)
WBC: 6.9 10*3/uL (ref 4.0–10.5)

## 2015-04-11 MED ORDER — PIPERACILLIN-TAZOBACTAM 3.375 G IVPB
3.3750 g | INTRAVENOUS | Status: AC
  Filled 2015-04-11: qty 50

## 2015-04-11 NOTE — Progress Notes (Addendum)
[facility] PROGRESS NOTE  Diana Pearson LGX:[id number] DOB: Aug 29, 1928 DOA: 04/06/2015 PCP: No PCP Per Patient  Assessment/Plan: Cholestatic jaundice/Liver mass -CT abd pelvis with 2.9x2.2cm mass in R liver, likely HCC, AFP 46, cholangiocarcinoma also possible -IR and GI following, S/P Percutaneous Transhepatic cholangiogram, with external biliary drain placement and endoluminal biopsy by Dr. Earleen Newport on 04/09/15, biopsy pending -Bili finally improving, mentation clear, no asterixis, PT/INR stable -Acute hepatitis panel negative except HCV Ab -CMV IgM and IgG elevated, called and d/w ID Dr.Hatcher  Hypertension -stable, continue metoprolol.  Hypothyroidism Continue levothyroxine  Hyperlipidemia  stopped statin due to liver failure.  Atrial fibrillation Rate controlled with metoprolol. Not on anticoagulation. She is followed by Dr. Dorris Carnes.  Aortic stenosis. Recent echo 4/16 shows severe stenosis of the aortic valve and mildly increased systolic pressure. No wall motion abnormalities. LV EF was 60-65%. FU with Cards  DVT proph: stopped heparin, since PT elevated from cirrhosis  Family Communication:none today, d/wdaughter [name] at bedside Friday Code Status: Full.  Dispo: home pending workup  Consultants: GI ID  HPI/Subjective: Feels ok, mild abd discomfort  Objective: Filed Vitals:   04/11/15 0931  BP: 122/58  Pulse: 65  Temp: 98.3 F (36.8 C)  Resp: 18    Intake/Output Summary (Last 24 hours) at 04/11/15 1352 Last data filed at 04/11/15 0854  Gross per 24 hour  Intake    240 ml  Output    810 ml  Net   -570 ml   Filed Weights   04/07/15 2107 04/09/15 2030 04/10/15 2149  Weight: 51.8 kg (114 lb 3.2 oz) 52.6 kg (115 lb 15.4 oz) 52.6 kg (115 lb 15.4 oz)    Exam:   General:  AAOx3, profoundly icteric  Cardiovascular: C1K4/YJE, systolic murmur  Respiratory: CTAB  Abdomen: soft, mild R sided tenderness, distended, BS  present  Musculoskeletal: no edema c/c   Data Reviewed: Basic Metabolic Panel:  Recent Labs Lab 04/07/15 0523 04/08/15 0720 04/09/15 0535 04/10/15 1000 04/11/15 0450  NA 138 135 134* 135 133*  K 4.3 4.5 4.5 4.8 4.5  CL 106 103 101 102 102  CO2 25 25 24 24 25   GLUCOSE 127* 91 94 131* 96  BUN 12 8 9 15 13   CREATININE 0.91 0.79 0.75 1.01* 0.80  CALCIUM 8.3* 8.3* 8.3* 8.3* 7.9*   Liver Function Tests:  Recent Labs Lab 04/07/15 0523 04/08/15 0720 04/09/15 0535 04/10/15 1000 04/11/15 0450  AST 122* 131* 161* 122* 76*  ALT 71* 72* 84* 72* 57*  ALKPHOS 245* 257* 290* 244* 194*  BILITOT 20.0* 20.1* 21.4* 21.5* 15.3*  PROT 6.0* 5.9* 6.4* 6.4* 6.0*  ALBUMIN 2.1* 2.0* 2.1* 2.2* 2.0*   No results for input(s): LIPASE, AMYLASE in the last 168 hours. No results for input(s): AMMONIA in the last 168 hours. CBC:  Recent Labs Lab 04/06/15 1015 04/07/15 0523 04/08/15 0720 04/09/15 0535 04/10/15 1000 04/11/15 0450  WBC 6.0 4.9 5.8 7.0 8.7 6.9  NEUTROABS 4.0  --   --   --   --   --   HGB 13.7 12.2 13.0 13.2 13.9 12.9  HCT 40.0 36.1 37.8 37.7 39.9 37.1  MCV 92.8 90.7 91.1 89.3 90.9 89.6  PLT 174 155 170 177 204 172   Cardiac Enzymes: No results for input(s): CKTOTAL, CKMB, CKMBINDEX, TROPONINI in the last 168 hours. BNP (last 3 results) No results for input(s): BNP in the last 8760 hours.  ProBNP (last 3 results) No results for input(s): PROBNP in the  last 8760 hours.  CBG: No results for input(s): GLUCAP in the last 168 hours.  No results found for this or any previous visit (from the past 240 hour(s)).   Studies: No results found.  Scheduled Meds: . levothyroxine  88 mcg Oral QAC breakfast  . metoprolol tartrate  12.5 mg Oral BID  . [START ON 04/12/2015] piperacillin-tazobactam (ZOSYN)  IV  3.375 g Intravenous to XRAY  . polyethylene glycol  17 g Oral Daily   Continuous Infusions:  Antibiotics Given (last 72 hours)    Date/Time Action Medication Dose Rate    04/09/15 0521 Given   piperacillin-tazobactam (ZOSYN) IVPB 3.375 g 3.375 g 12.5 mL/hr      Principal Problem:   Acute liver failure Active Problems:   Chronic hepatitis C without hepatic coma   Hepatic cirrhosis   Hypertension   Hypothyroidism   Jaundice   Cirrhosis   Liver mass    Time spent: 5min    Diana Pearson  [facility] Pager [phone]. If 7PM-7AM, please contact night-coverage at [email or web address], password [facility] 04/11/2015, 1:52 PM  LOS: 5 days

## 2015-04-11 NOTE — Consult Note (Signed)
Chief Complaint: Patient was seen in consultation today for obstructive jaundice at the request of Dr Dale Cellar  Referring Physician(s): Dr Havery Moros  History of Present Illness: Diana Pearson is a 81 y.o. female  Obstructive jaundice Liver mass Hep C PTC and external drain placed 9/10 in IR Returning 9/13 for additional cholangiogram with possible drain exchange / possible Internal- external drain placement TB 15.3 (21.5)   Past Medical History  Diagnosis Date  . Hypothyroidism   . Osteoporosis   . Atrial fibrillation     no anticoagulation.   . Jaundice 04/06/2015    Sudden onset, 10 days following initiation of Zepatier for Hep C.   . Abdominal distention 04/06/2015  . Aortic stenosis 10/2014    by echo.   . Hypercholesterolemia   . History of blood transfusion ~ 2006    "related to bleeding stomach ulcer"  . Bleeding stomach ulcer ~ 2006  . Hepatitis C ~ 2006    needle stick in 1970s    Past Surgical History  Procedure Laterality Date  . Hip fracture surgery Right 2012  . Fracture surgery    . Cataract extraction w/ intraocular lens  implant, bilateral Bilateral 1990's    Allergies: Review of patient's allergies indicates no known allergies.  Medications: Prior to Admission medications   Medication Sig Start Date End Date Taking? Authorizing Provider  levothyroxine (SYNTHROID, LEVOTHROID) 88 MCG tablet Take 88 mcg by mouth daily before breakfast.   Yes Historical Provider, MD  metoprolol tartrate (LOPRESSOR) 25 MG tablet Take 0.5 tablets (12.5 mg total) by mouth 2 (two) times daily. 10/11/14  Yes Fay Records, MD  pravastatin (PRAVACHOL) 20 MG tablet Take 1 tablet (20 mg total) by mouth daily. 10/11/14  Yes Fay Records, MD  ondansetron (ZOFRAN) 4 MG tablet Take 1 tablet (4 mg total) by mouth every 8 (eight) hours as needed for nausea or vomiting. Patient not taking: Reported on 04/06/2015 03/24/15   Thayer Headings, MD     Family History  Problem Relation  Age of Onset  . Stroke Mother     Social History   Social History  . Marital Status: Widowed    Spouse Name: N/A  . Number of Children: N/A  . Years of Education: N/A   Social History Main Topics  . Smoking status: Former Smoker -- 0.12 packs/day for 10 years    Types: Cigarettes  . Smokeless tobacco: Never Used     Comment: "quit smoking in the 1990's"  . Alcohol Use: No  . Drug Use: No  . Sexual Activity: No   Other Topics Concern  . None   Social History Narrative     Review of Systems: A 12 point ROS discussed and pertinent positives are indicated in the HPI above.  All other systems are negative.  Review of Systems  Respiratory: Negative for shortness of breath.   Gastrointestinal: Positive for nausea and abdominal pain.  Genitourinary: Positive for vaginal bleeding.  Neurological: Positive for weakness.  Psychiatric/Behavioral: Negative for behavioral problems.    Vital Signs: BP 122/58 mmHg  Pulse 65  Temp(Src) 98.3 F (36.8 C) (Oral)  Resp 18  Wt 115 lb 15.4 oz (52.6 kg)  SpO2 93%  Physical Exam  Eyes: Scleral icterus is present.  Cardiovascular: Normal rate and regular rhythm.   Pulmonary/Chest: Effort normal and breath sounds normal.  Abdominal: Soft. There is tenderness.  Musculoskeletal: Normal range of motion.  Neurological: She is alert.  Skin: Skin  is warm.  jaundice  Psychiatric: She has a normal mood and affect.  Consent will be gained with family  Nursing note and vitals reviewed. Bili drain has good output: bilious  Mallampati Score:  MD Evaluation Airway: WNL Heart: WNL Abdomen: WNL Chest/ Lungs: WNL ASA  Classification: 3 Mallampati/Airway Score: One  Imaging: Ct Abdomen Pelvis W Wo Contrast  04/08/2015   CLINICAL DATA:  81 year old female with hepatitis-C, jaundice, hypothyroidism and abdominal distention. Possible portal vein thrombosis on recent sonogram.  EXAM: CT ABDOMEN AND PELVIS WITHOUT AND WITH CONTRAST  TECHNIQUE:  Multidetector CT imaging of the abdomen and pelvis was performed following the standard protocol before and following the bolus administration of intravenous contrast.  CONTRAST:  175m OMNIPAQUE IOHEXOL 300 MG/ML  SOLN  COMPARISON:  04/07/2015 abdominal sonogram.  FINDINGS: Lower chest: Cardiomegaly. There is atherosclerosis of the thoracic aorta, the great vessels of the mediastinum and the coronary arteries, including calcified atherosclerotic plaque in the left anterior descending, left circumflex and right coronary arteries. Aortic valvular and mitral annular calcifications are present. Subsegmental dependent atelectasis at both lung bases.  Hepatobiliary: The liver surface is diffusely irregular and there is asymmetric hypertrophy of the lateral left liver lobe, indicating cirrhosis. There is an ill-defined 2.9 x 2.2 cm liver mass within the central segment 8 right liver lobe (series 501/image 49), which demonstrates progressive heterogeneous enhancement. There is marked diffuse intrahepatic biliary ductal dilatation. There is an enhancing tumor thrombus at the main portal vein bifurcation extending into the proximal left and right portal vein branches. The gallbladder is contracted. The common bile duct is normal caliber (4 mm diameter). There are no additional liver masses.  Pancreas: No pancreatic mass. Ill-defined peripancreatic fluid throughout the length of the pancreas. No focal peripancreatic fluid collection. No main pancreatic duct dilation.  Spleen: Top normal size spleen (12.2 cm craniocaudal splenic length). No splenic mass.  Adrenals/Urinary Tract: Normal adrenals. Simple 1.0 cm posterior interpolar left renal cyst. Simple 1.1 cm exophytic lower left renal cyst. Additional subcentimeter hypodense left renal lesions, too small to characterize. No hydronephrosis. No nephrolithiasis. Visualized ureters are normal caliber. On delayed imaging, there is no urothelial wall thickening and there are no  filling defects in the opacified portions of the bilateral collecting systems or visualized proximal ureters.  Stomach/Bowel: Relatively collapsed and grossly normal stomach normal caliber small bowel, with no appreciable small bowel wall thickening normal appendix. Normal large bowel, with no large bowel wall thickening.  Vascular/Lymphatic: Atherosclerotic nonaneurysmal abdominal aorta. Patent splenic, hepatic and renal veins. Enlarged 1.4 cm short axis gastrohepatic ligament lymph node (series 501/ image 65). Mild porta hepatis lymphadenopathy.  Reproductive: Grossly normal uterus. Simple 2.3 cm left adnexal cystic structure (series 501/image 152).  Other: Small volume ascites.  No pneumoperitoneum.  Musculoskeletal: Partially visualized right total hip arthroplasty. Mild T10 and L1 vertebral body compression fractures and severe L2 vertebral body compression fracture of indeterminate chronicity. Mild to moderate degenerative changes in the visualized thoracolumbar spine. No aggressive appearing focal osseous lesions.  IMPRESSION: 1. 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 A cholangiocarcinoma ([place] tumor) is favored given the location and biliary ductal dilatation, however hepatocellular carcinoma is on the differential given the underlying cirrhosis and the presence of portal vein tumor thrombus. 2. Small volume ascites.  Top-normal size spleen. 3. Mild porta hepatis and  gastrohepatic ligament lymphadenopathy, nonspecific. 4. Mild T10 and L1 and superior L2 vertebral body compression fractures of indeterminate chronicity, likely chronic. 5. Cardiomegaly. Atherosclerosis, including three-vessel coronary artery disease. Please note that although the presence of coronary artery calcium documents the presence of  coronary artery disease, the severity of this disease and any potential stenosis cannot be assessed on this non-gated CT examination. 6. Simple benign-appearing 2.3 cm left adnexal cyst. No follow-up is required. This recommendation follows ACR consensus guidelines: White Paper of the ACR Incidental Findings Committee II on Adnexal Findings. J Am Coll Radiol [phone]. These results were called by telephone at the time of interpretation on 04/08/2015 at 8:55 am to Dr. Nicoletta Ba , who verbally acknowledged these results.   Electronically Signed   By: Ilona Sorrel M.D.   On: 04/08/2015 09:06   US Abdomen Complete  04/06/2015   CLINICAL DATA:  Acute liver failure.  Jaundice.  EXAM: ULTRASOUND ABDOMEN COMPLETE  COMPARISON:  None.  FINDINGS: Gallbladder: Gallbladder is not visualized and may be surgically absent or contracted.  Common bile duct: Diameter: 5.3 mm, normal  Liver: Coarsened parenchymal echotexture with nodular contour suggesting hepatic cirrhosis. Prominence of portal veins with flow in the appropriate direction. Suggestion of mild intrahepatic bile duct dilatation. No focal liver lesions identified.  IVC: No abnormality visualized.  Pancreas: Visualized portion unremarkable.  Spleen: Spleen appears enlarged with homogeneous parenchymal echotexture.  Right Kidney: Length: 10.3 cm. Echogenicity within normal limits. No mass or hydronephrosis visualized.  Left Kidney: Length: 9.6 cm. Small cyst demonstrated in the lower pole measuring up to about 1 cm diameter. No hydronephrosis.  Abdominal aorta: No aneurysm visualized.  Other findings: None.  IMPRESSION: Coarsened liver echotexture with nodular contour and prominent pulmonary venous structures consistent with hepatic cirrhosis. No focal lesions identified. Suggestion of intrahepatic bile duct dilatation without extrahepatic ductal dilatation. Gallbladder not visualized. Probable splenic enlargement.   Electronically Signed   By: Lucienne Capers  M.D.   On: 04/06/2015 23:51   [place] Art/ven Flow Abd Pelv Doppler  04/07/2015   CLINICAL DATA:  Jaundice, hepatic cirrhosis.  EXAM: DUPLEX ULTRASOUND OF LIVER  TECHNIQUE: Color and duplex Doppler ultrasound was performed to evaluate the hepatic in-flow and out-flow vessels. Exam is significantly limited as patient does not speak English and unable to follow breathing instructions.  COMPARISON:  None.  FINDINGS: Portal Vein Velocities  Main: 18.1 cm/sec proximally ; hepatopetal flow is noted proximally and in the midportion, but no flow is noted distally consistent with portal venous thrombus.  Right:  Not visualized due to limitations described above.  Left:  Not visualized due to limitations described above.  Hepatic Vein Velocities  Right:  20.5 cm/sec ; hepatofugal flow is noted.  Middle:  Not visualized due to limitations described above.  Left:  29.7 Cm/sec ; hepatofugal flow is noted.  Hepatic Artery Velocity:  146 cm/sec  Splenic Vein Velocity:  17.1 cm/sec  Varices: Present.  Ascites: Present.  IMPRESSION: Exam is significantly limited as patient does not speak English and unable to follow breathing instructions. There is probable occlusive thrombus seen in the distal portal vein. Middle hepatic vein is not visualized due to previously described limitations, but right and left hepatic veins demonstrate normal flow and velocity. These results will be called to the ordering clinician or representative by the Radiologist Assistant, and communication documented in the PACS or zVision Dashboard.   Electronically Signed   By: Marijo Conception, M.D.   On: 04/07/2015 08:12  Ir Biliary Drain Placement With Cholangiogram  04/09/2015   CLINICAL DATA:  81 year old female presents with jaundice, hyperbilirubinemia, and imaging diagnosis of hilar tumor in the liver. CT demonstrates intrahepatic biliary ductal dilatation.  She has been referred for percutaneous transhepatic cholangiogram and drainage.  EXAM: IR BILIARY  DRAIN PLACEMENT W/ CHOLANGIOGRAM; IR ENDOLUMNICAL BIOPSY OF BILIARY TREE  COMPARISON:  CT 04/07/2015  ANESTHESIA/SEDATION: 4.0 IV Versed; 2 high mcg IV Fentanyl.  Total Moderate Sedation Time: 45 minutes.  CONTRAST:  60m OMNIPAQUE IOHEXOL 300 MG/ML  SOLN  MEDICATIONS: Receiving Zosyn  Procedural  FLUOROSCOPY TIME:  18 minutes 36 second  TECHNIQUE: The procedure, risks, benefits, and alternatives were explained to the patient and the patient's family. A complete informed consent was performed, with risk benefit analysis. Specific risks that were discussed for the procedure include bleeding, infection, biliary sepsis, IC use day, organ injury, need for further procedure, need for further surgery, long-term drain placement, cardiopulmonary collapse, death. Questions regarding the procedure were encouraged and answered. The patient understands and consents to the procedure.  Patient is position in supine position on the fluoroscopy table, and the upper abdomen was prepped and draped in the usual sterile fashion. Maximum barrier sterile technique with sterile gowns and gloves were used for the procedure. A timeout was performed prior to the initiation of the procedure. Local anesthesia was provided with 1% lidocaine with epinephrine.  Ultrasound survey of the right liver lobe was performed. Window into the right biliary system was present for the right liver approach.  1% lidocaine was used for local anesthesia, with generous infiltration of the skin and subcutaneous tissues in and inter left costal location. A Chiba needle was advanced under ultrasound guidance into the right liver lobe.  Once the tip of the needle was confirmed within the biliary system by injecting small aliquots of contrast, images were stored of the biliary system after partially opacifying the biliary tree via the needle.  Once the tip of this needle was confirmed within the biliary system, an 018 wire was advanced centrally. The needle was  removed, a small incision was made with an 11 blade scalpel.  An Accustick system would not advance into the biliary system, and u[place]oreaa micropuncture system was advanced over the 018 wire into the biliary tree. The inner dilator and wire were removed, and an 035 catheter was advanced into the biliary system. A standard 6 French sheath was then placed in the biliary system.  Cholangiogram was then performed.  A combination of Glidewire, 4 FPakistanglide cath, 5 FPakistancobra catheter, as well as a 6 FP[place]beacon tip 35 cm sheath used to attempt to navigate through the obstruction with no success.  A standard pigtail 10 French drainage catheter was placed over the [employer]  We confirmed placement with contrast infusion.  The patient tolerated the procedure well and remained hemodynamically stable throughout.  No complications were encountered and no significant blood loss was encountered.  COMPLICATIONS: None  FINDINGS: Ultrasound survey demonstrates dilated intrahepatic biliary ducts. Approach into the right liver lobe was identified.  Percutaneous transhepatic cholangiogram demonstrates moderate to severely dilated intrahepatic biliary ducts. There is communication of the left and right-sided ducts, and the os a right and left drain may not be required for drainage.  Dilated ducts of the segment 4B should not be confused for common hepatic duct. Common hepatic duct appears to be closer to the patient's right side.  Small amount of contrast did opacified I the common hepatic  duct and traversed the common bile duct into the duodenum. Cystic duct opacified with incomplete filling of the gallbladder.  Unable to traverse the obstruction from our initial puncture.  IMPRESSION: Status post percutaneous transhepatic cholangiogram with demonstration of high-grade stenosis of the common hepatic duct, and subsequent placement of externalized standard 10 French pigtail catheter drain.  Communication of left and right-sided ducts  was demonstrated.  Signed,  Dulcy Fanny. Earleen Newport, DO  [facility]  [facility]  PLAN: Would recommend repeat through the tube cholangiogram on Monday or Tuesday of the upcoming week for drainage catheter exchange and a second attempt at traversing the obstruction.  Twice a day 10 cc saline flushes until drain exchange.   Electronically Signed   By: Corrie Mckusick D.O.   On: 04/09/2015 13:51   Ir Endoluminal Bx Of Biliary Tree  04/09/2015   CLINICAL DATA:  81 year old female presents with jaundice, hyperbilirubinemia, and imaging diagnosis of hilar tumor in the liver. CT demonstrates intrahepatic biliary ductal dilatation.  She has been referred for percutaneous transhepatic cholangiogram and drainage.  EXAM: IR BILIARY DRAIN PLACEMENT W/ CHOLANGIOGRAM; IR ENDOLUMNICAL BIOPSY OF BILIARY TREE  COMPARISON:  CT 04/07/2015  ANESTHESIA/SEDATION: 4.0 IV Versed; 2 high mcg IV Fentanyl.  Total Moderate Sedation Time: 45 minutes.  CONTRAST:  21m OMNIPAQUE IOHEXOL 300 MG/ML  SOLN  MEDICATIONS: Receiving Zosyn  Procedural  FLUOROSCOPY TIME:  18 minutes 36 second  TECHNIQUE: The procedure, risks, benefits, and alternatives were explained to the patient and the patient's family. A complete informed consent was performed, with risk benefit analysis. Specific risks that were discussed for the procedure include bleeding, infection, biliary sepsis, IC use day, organ injury, need for further procedure, need for further surgery, long-term drain placement, cardiopulmonary collapse, death. Questions regarding the procedure were encouraged and answered. The patient understands and consents to the procedure.  Patient is position in supine position on the fluoroscopy table, and the upper abdomen was prepped and draped in the usual sterile fashion. Maximum barrier sterile technique with sterile gowns and gloves were used for the procedure. A timeout was performed prior to the initiation of the  procedure. Local anesthesia was provided with 1% lidocaine with epinephrine.  Ultrasound survey of the right liver lobe was performed. Window into the right biliary system was present for the right liver approach.  1% lidocaine was used for local anesthesia, with generous infiltration of the skin and subcutaneous tissues in and inter left costal location. A Chiba needle was advanced under ultrasound guidance into the right liver lobe.  Once the tip of the needle was confirmed within the biliary system by injecting small aliquots of contrast, images were stored of the biliary system after partially opacifying the biliary tree via the needle.  Once the tip of this needle was confirmed within the biliary system, an 018 wire was advanced centrally. The needle was removed, a small incision was made with an 11 blade scalpel.  An Accustick system would not advance into the biliary system, and uKoreaa micropuncture system was advanced over the 018 wire into the biliary tree. The inner dilator and wire were removed, and an 035 catheter was advanced into the biliary system. A standard 6 French sheath was then placed in the biliary system.  Cholangiogram was then performed.  A combination of Glidewire, 4 FPakistanglide cath, 5 FPakistancobra catheter, as well as a 6 [place]acon tip 35 cm sheath used to attempt to navigate through the obstruction with  no success.  A standard pigtail 10 French drainage catheter was placed over the [employer].  We confirmed placement with contrast infusion.  The patient tolerated the procedure well and remained hemodynamically stable throughout.  No complications were encountered and no significant blood loss was encountered.  COMPLICATIONS: None  FINDINGS: Ultrasound survey demonstrates dilated intrahepatic biliary ducts. Approach into the right liver lobe was identified.  Percutaneous transhepatic cholangiogram demonstrates moderate to severely dilated intrahepatic biliary ducts. There is  communication of the left and right-sided ducts, and the os a right and left drain may not be required for drainage.  Dilated ducts of the segment 4B should not be confused for common hepatic duct. Common hepatic duct appears to be closer to the patient's right side.  Small amount of contrast did opacified I the common hepatic duct and traversed the common bile duct into the duodenum. Cystic duct opacified with incomplete filling of the gallbladder.  Unable to traverse the obstruction from our initial puncture.  IMPRESSION: Status post percutaneous transhepatic cholangiogram with demonstration of high-grade stenosis of the common hepatic duct, and subsequent placement of externalized standard 10 French pigtail catheter drain.  Communication of left and right-sided ducts was demonstrated.  Signed,  Dulcy Fanny. Earleen Newport, DO  [facility]  [facility]  PLAN: Would recommend repeat through the tube cholangiogram on Monday or Tuesday of the upcoming week for drainage catheter exchange and a second attempt at traversing the obstruction.  Twice a day 10 cc saline flushes until drain exchange.   Electronically Signed   By: Corrie Mckusick D.O.   On: 04/09/2015 13:51    Labs:  CBC:  Recent Labs  04/08/15 0720 04/09/15 0535 04/10/15 1000 04/11/15 0450  WBC 5.8 7.0 8.7 6.9  HGB 13.0 13.2 13.9 12.9  HCT 37.8 37.7 39.9 37.1  PLT 170 177 204 172    COAGS:  Recent Labs  04/06/15 1405 04/06/15 2034 04/07/15 0523 04/08/15 0720 04/08/15 1316  INR 1.44 1.42 1.47 1.59*  --   APTT 30  --   --   --  34    BMP:  Recent Labs  04/08/15 0720 04/09/15 0535 04/10/15 1000 04/11/15 0450  NA 135 134* 135 133*  K 4.5 4.5 4.8 4.5  CL 103 101 102 102  CO2 '25 24 24 25  ' GLUCOSE 91 94 131* 96  BUN '8 9 15 13  ' CALCIUM 8.3* 8.3* 8.3* 7.9*  CREATININE 0.79 0.75 1.01* 0.80  GFRNONAA >60 >60 50* >60  GFRAA >60 >60 58* >60    LIVER FUNCTION TESTS:  Recent Labs   04/08/15 0720 04/09/15 0535 04/10/15 1000 04/11/15 0450  BILITOT 20.1* 21.4* 21.5* 15.3*  AST 131* 161* 122* 76*  ALT 72* 84* 72* 57*  ALKPHOS 257* 290* 244* 194*  PROT 5.9* 6.4* 6.4* 6.0*  ALBUMIN 2.0* 2.1* 2.2* 2.0*    TUMOR MARKERS:  Recent Labs  04/07/15 1615  AFPTM 46.6*    Assessment and Plan:  Obstructive jaundice Hep C Hyperbilirubinemia External biliary drain placed 9/10 in IR Unable to place I/E drain Percutaneous transhepatic cholangiogram and poss Int/Ext drain placement scheduled for 9/13 Risks and Benefits discussed with the patient's family including, but not limited to bleeding, infection which may lead to sepsis or even death and damage to adjacent structures. All of the patient's daughter questions were answered, she is agreeable to proceed. Consent signed and in chart.   Thank you for this interesting consult.  I greatly enjoyed meeting  Donique Burch and look forward to participating in their care.  A copy of this report was sent to the requesting provider on this date.  Signed: Mase Dhondt A 04/11/2015, 9:48 AM   I spent a total of 20 Minutes    in face to face in clinical consultation, greater than 50% of which was counseling/coordinating care for re attempt bili drain

## 2015-04-11 NOTE — Progress Notes (Signed)
Used [employer] [phone]) to communicate with patient and perform head to toe assessment. Pt stated she had some pain in her RL abdomen. 1 mg dilaudid given. Was able to answer pt's questions and discuss today's plan of care.

## 2015-04-11 NOTE — Progress Notes (Signed)
   INFECTIOUS DISEASE PROGRESS NOTE  ID: Diana Pearson is a 81 y.o. female with  Principal Problem:   Acute liver failure Active Problems:   Chronic hepatitis C without hepatic coma   Hepatic cirrhosis   Hypertension   Hypothyroidism   Jaundice   Cirrhosis   Liver mass  Subjective: Feels better.   Abtx:  Anti-infectives    Start     Dose/Rate Route Frequency Ordered Stop   04/12/15 0800  piperacillin-tazobactam (ZOSYN) IVPB 3.375 g     3.375 g 12.5 mL/hr over 240 Minutes Intravenous To Radiology 04/11/15 0948 04/13/15 0800   04/09/15 0600  piperacillin-tazobactam (ZOSYN) IVPB 3.375 g     3.375 g 12.5 mL/hr over 240 Minutes Intravenous To Radiology 04/08/15 1442 04/09/15 0921   04/08/15 1230  ceFAZolin (ANCEF) IVPB 2 g/50 mL premix  Status:  Discontinued     2 g 100 mL/hr over 30 Minutes Intravenous To Radiology 04/08/15 1226 04/08/15 1227   04/08/15 1230  piperacillin-tazobactam (ZOSYN) IVPB 3.375 g  Status:  Discontinued     3.375 g 12.5 mL/hr over 240 Minutes Intravenous To Radiology 04/08/15 1228 04/08/15 1441      Medications:  Scheduled: . levothyroxine  88 mcg Oral QAC breakfast  . metoprolol tartrate  12.5 mg Oral BID  . [START ON 04/12/2015] piperacillin-tazobactam (ZOSYN)  IV  3.375 g Intravenous to XRAY  . polyethylene glycol  17 g Oral Daily    Objective: Vital signs in last 24 hours: Temp:  [98 F (36.7 C)-98.7 F (37.1 C)] 98.3 F (36.8 C) (09/12 1501) Pulse Rate:  [60-65] 60 (09/12 1501) Resp:  [17-18] 18 (09/12 1501) BP: (110-122)/(52-60) 110/52 mmHg (09/12 1501) SpO2:  [93 %-96 %] 94 % (09/12 1501) Weight:  [52.6 kg (115 lb 15.4 oz)] 52.6 kg (115 lb 15.4 oz) (09/11 2149)   General appearance: alert, cooperative, no distress and mild decrease in jaundice  Lab Results  Recent Labs  04/10/15 1000 04/11/15 0450  WBC 8.7 6.9  HGB 13.9 12.9  HCT 39.9 37.1  NA 135 133*  K 4.8 4.5  CL 102 102  CO2 24 25  BUN 15 13  CREATININE 1.01* 0.80    Liver Panel  Recent Labs  04/10/15 1000 04/11/15 0450  PROT 6.4* 6.0*  ALBUMIN 2.2* 2.0*  AST 122* 76*  ALT 72* 57*  ALKPHOS 244* 194*  BILITOT 21.5* 15.3*   Sedimentation Rate No results for input(s): ESRSEDRATE in the last 72 hours. C-Reactive Protein No results for input(s): CRP in the last 72 hours.  Microbiology: No results found for this or any previous visit (from the past 240 hour(s)).  Studies/Results: No results found.   Assessment/Plan: Cholestatic Jaundice CMV serologies are positive but I would again suggest that her AST/ALT would be significantly  higher if this were the cause of her illness.   [place] tumor vs HCCa  Biliary drain placed 9-10, bili decreased in last 24h.   Perc drain placement in AM  Hepatitis C Hold zepatier, doubt this was direct caase of this illness.   Explained to pt's family member  Available as needed  Total days of antibiotics: none         Bobby Rumpf Infectious Diseases (pager) [phone] [email or web address] 04/11/2015, 7:40 PM  LOS: 5 days

## 2015-04-12 LAB — CBC
HEMATOCRIT: 36 % (ref 36.0–46.0)
HEMOGLOBIN: 12.7 g/dL (ref 12.0–15.0)
MCH: 31.8 pg (ref 26.0–34.0)
MCHC: 35.3 g/dL (ref 30.0–36.0)
MCV: 90 fL (ref 78.0–100.0)
Platelets: 163 10*3/uL (ref 150–400)
RBC: 4 MIL/uL (ref 3.87–5.11)
RDW: 18 % — ABNORMAL HIGH (ref 11.5–15.5)
WBC: 7 10*3/uL (ref 4.0–10.5)

## 2015-04-12 LAB — COMPREHENSIVE METABOLIC PANEL
ALBUMIN: 1.9 g/dL — AB (ref 3.5–5.0)
ALK PHOS: 179 U/L — AB (ref 38–126)
ALT: 48 U/L (ref 14–54)
ANION GAP: 7 (ref 5–15)
AST: 67 U/L — AB (ref 15–41)
BUN: 11 mg/dL (ref 6–20)
CALCIUM: 7.9 mg/dL — AB (ref 8.9–10.3)
CO2: 25 mmol/L (ref 22–32)
Chloride: 103 mmol/L (ref 101–111)
Creatinine, Ser: 0.73 mg/dL (ref 0.44–1.00)
GFR calc Af Amer: >60 mL/min (ref >60)
GFR calc non Af Amer: >60 mL/min (ref >60)
GLUCOSE: 89 mg/dL (ref 65–99)
POTASSIUM: 4.2 mmol/L (ref 3.5–5.1)
SODIUM: 135 mmol/L (ref 135–145)
Total Bilirubin: 14.1 mg/dL — ABNORMAL HIGH (ref 0.3–1.2)
Total Protein: 6.1 g/dL — ABNORMAL LOW (ref 6.5–8.1)

## 2015-04-12 LAB — PROTIME-INR
INR: 1.46 (ref 0.00–1.49)
PROTHROMBIN TIME: 17.8 s — AB (ref 11.6–15.2)

## 2015-04-12 LAB — APTT: aPTT: 33 seconds (ref 24–37)

## 2015-04-12 NOTE — Progress Notes (Signed)
[facility] PROGRESS NOTE  Diana Pearson SHF:[id number] DOB: Jun 14, 1929 DOA: 04/06/2015 PCP: No PCP Per Patient  81 year old female from [place] Arabic speaking with history of atrial fibrillation, hepatitis C with cirrhosis, aortic stenosis, hypothyroidism who presented from the infectious disease office with nausea, jaundice, epigastric abdominal pain associated with Zepatier which was started 20 days prior to admission for treatment of her hepatitis C. 10 days prior to admission patient noticed she started to get jaundiced , Lab work obtained showed a bilirubin level of 23.5 without phosphatase of 294 abdomen of 2.7 AST 159 ALT of 93 -Since admission, she was found to have a liver mass and  high grade stenosis of the common hepatic duct, underwent brush biopsy and ext biliary drain placement 9/10 with plans for internalization of drains. -Bili improving, mentation stable -Gi signed off, IR following -Biopsy non diagnostic will need to be repeated  Assessment/Plan: Cholestatic jaundice/Liver mass -CT abd pelvis with 2.9x2.2cm mass in R liver, likely HCC, AFP 46, cholangiocarcinoma also possible -IR and GI following, S/P Percutaneous Transhepatic cholangiogram, with external biliary drain placement and endoluminal biopsy by Dr. Earleen Newport on 04/09/15,  -Path: Non Diagnostic, called IR and asked if repeat biopsy can be attempted when drains internalized -Bili finally improving, mentation clear, no asterixis, PT/INR stable -Acute hepatitis panel negative except HCV Ab -CMV IgM and IgG elevated, called and d/w ID Dr.Hatcher-see note -updated daughter abt need for repeat biopsy  Hypertension -stable, continue metoprolol.  Hypothyroidism Continue levothyroxine  Hyperlipidemia  stopped statin due to liver failure.  Atrial fibrillation Rate controlled with metoprolol. Not on anticoagulation. She is followed by Dr. Dorris Carnes.  Aortic stenosis. Recent echo 4/16 shows severe stenosis of the  aortic valve and mildly increased systolic pressure. No wall motion abnormalities. LV EF was 60-65%. FU with Cards  DVT proph: stopped heparin, since PT elevated from cirrhosis  Family Communication:d/wdaughter [name]  Code Status: Full.  Dispo: home pending workup  Consultants: GI ID  HPI/Subjective: Feels ok, mild abd discomfort, NPO for possible drain exchange  Objective: Filed Vitals:   04/12/15 1000  BP: 125/70  Pulse: 59  Temp: 98.1 F (36.7 C)  Resp: 18    Intake/Output Summary (Last 24 hours) at 04/12/15 1442 Last data filed at 04/12/15 0900  Gross per 24 hour  Intake     60 ml  Output    200 ml  Net   -140 ml   Filed Weights   04/09/15 2030 04/10/15 2149 04/11/15 2100  Weight: 52.6 kg (115 lb 15.4 oz) 52.6 kg (115 lb 15.4 oz) 52.2 kg (115 lb 1.3 oz)    Exam:   General:  AAOx3, profoundly icteric  Cardiovascular: F0Y7/XAJ, systolic murmur  Respiratory: CTAB  Abdomen: soft, mild R sided tenderness, less distended today, BS present  Musculoskeletal: no edema c/c   Data Reviewed: Basic Metabolic Panel:  Recent Labs Lab 04/08/15 0720 04/09/15 0535 04/10/15 1000 04/11/15 0450 04/12/15 0546  NA 135 134* 135 133* 135  K 4.5 4.5 4.8 4.5 4.2  CL 103 101 102 102 103  CO2 25 24 24 25 25   GLUCOSE 91 94 131* 96 89  BUN 8 9 15 13 11   CREATININE 0.79 0.75 1.01* 0.80 0.73  CALCIUM 8.3* 8.3* 8.3* 7.9* 7.9*   Liver Function Tests:  Recent Labs Lab 04/08/15 0720 04/09/15 0535 04/10/15 1000 04/11/15 0450 04/12/15 0546  AST 131* 161* 122* 76* 67*  ALT 72* 84* 72* 57* 48  ALKPHOS 257* 290* 244* 194* 179*  BILITOT 20.1* 21.4* 21.5* 15.3* 14.1*  PROT 5.9* 6.4* 6.4* 6.0* 6.1*  ALBUMIN 2.0* 2.1* 2.2* 2.0* 1.9*   No results for input(s): LIPASE, AMYLASE in the last 168 hours. No results for input(s): AMMONIA in the last 168 hours. CBC:  Recent Labs Lab 04/06/15 1015  04/08/15 0720 04/09/15 0535 04/10/15 1000 04/11/15 0450 04/12/15 0546   WBC 6.0  < > 5.8 7.0 8.7 6.9 7.0  NEUTROABS 4.0  --   --   --   --   --   --   HGB 13.7  < > 13.0 13.2 13.9 12.9 12.7  HCT 40.0  < > 37.8 37.7 39.9 37.1 36.0  MCV 92.8  < > 91.1 89.3 90.9 89.6 90.0  PLT 174  < > 170 177 204 172 163  < > = values in this interval not displayed. Cardiac Enzymes: No results for input(s): CKTOTAL, CKMB, CKMBINDEX, TROPONINI in the last 168 hours. BNP (last 3 results) No results for input(s): BNP in the last 8760 hours.  ProBNP (last 3 results) No results for input(s): PROBNP in the last 8760 hours.  CBG: No results for input(s): GLUCAP in the last 168 hours.  No results found for this or any previous visit (from the past 240 hour(s)).   Studies: No results found.  Scheduled Meds: . levothyroxine  88 mcg Oral QAC breakfast  . metoprolol tartrate  12.5 mg Oral BID  . piperacillin-tazobactam (ZOSYN)  IV  3.375 g Intravenous to XRAY  . polyethylene glycol  17 g Oral Daily   Continuous Infusions:  Antibiotics Given (last 72 hours)    None      Principal Problem:   Acute liver failure Active Problems:   Chronic hepatitis C without hepatic coma   Hepatic cirrhosis   Hypertension   Hypothyroidism   Jaundice   Cirrhosis   Liver mass    Time spent: 74min    Telisa Ohlsen  [facility] Pager [phone]. If 7PM-7AM, please contact night-coverage at [email or web address], password [facility] 04/12/2015, 2:42 PM  LOS: 6 days

## 2015-04-13 ENCOUNTER — Inpatient Hospital Stay (HOSPITAL_COMMUNITY): Payer: Medicaid Other

## 2015-04-13 LAB — CBC
HCT: 37 % (ref 36.0–46.0)
HEMOGLOBIN: 13 g/dL (ref 12.0–15.0)
MCH: 32.1 pg (ref 26.0–34.0)
MCHC: 35.1 g/dL (ref 30.0–36.0)
MCV: 91.4 fL (ref 78.0–100.0)
Platelets: 181 10*3/uL (ref 150–400)
RBC: 4.05 MIL/uL (ref 3.87–5.11)
RDW: 18.2 % — ABNORMAL HIGH (ref 11.5–15.5)
WBC: 7.3 10*3/uL (ref 4.0–10.5)

## 2015-04-13 LAB — COMPREHENSIVE METABOLIC PANEL
ALK PHOS: 158 U/L — AB (ref 38–126)
ALT: 39 U/L (ref 14–54)
ANION GAP: 8 (ref 5–15)
AST: 65 U/L — ABNORMAL HIGH (ref 15–41)
Albumin: 1.9 g/dL — ABNORMAL LOW (ref 3.5–5.0)
BILIRUBIN TOTAL: 13.8 mg/dL — AB (ref 0.3–1.2)
BUN: 13 mg/dL (ref 6–20)
CALCIUM: 7.9 mg/dL — AB (ref 8.9–10.3)
CO2: 25 mmol/L (ref 22–32)
Chloride: 102 mmol/L (ref 101–111)
Creatinine, Ser: 0.79 mg/dL (ref 0.44–1.00)
GFR calc non Af Amer: >60 mL/min (ref >60)
Glucose, Bld: 82 mg/dL (ref 65–99)
Potassium: 4.3 mmol/L (ref 3.5–5.1)
Sodium: 135 mmol/L (ref 135–145)
TOTAL PROTEIN: 6 g/dL — AB (ref 6.5–8.1)

## 2015-04-13 MED ORDER — FENTANYL CITRATE (PF) 100 MCG/2ML IJ SOLN
INTRAMUSCULAR | Status: AC | PRN
  Administered 2015-04-13 (×4): 50 ug via INTRAVENOUS

## 2015-04-13 MED ORDER — PIPERACILLIN-TAZOBACTAM 3.375 G IVPB
3.3750 g | INTRAVENOUS | Status: AC
  Administered 2015-04-13: 3.375 g via INTRAVENOUS
  Filled 2015-04-13: qty 50

## 2015-04-13 MED ORDER — MIDAZOLAM HCL 2 MG/2ML IJ SOLN
INTRAMUSCULAR | Status: AC
  Filled 2015-04-13: qty 2

## 2015-04-13 MED ORDER — LIDOCAINE HCL 1 % IJ SOLN
INTRAMUSCULAR | Status: AC
  Filled 2015-04-13: qty 20

## 2015-04-13 MED ORDER — FENTANYL CITRATE (PF) 100 MCG/2ML IJ SOLN
INTRAMUSCULAR | Status: AC
  Filled 2015-04-13: qty 2

## 2015-04-13 MED ORDER — IOHEXOL 300 MG/ML  SOLN
50.0000 mL | Freq: Once | INTRAMUSCULAR | Status: DC | PRN
  Administered 2015-04-13: 15 mL via INTRAVENOUS
  Filled 2015-04-13: qty 50

## 2015-04-13 MED ORDER — MIDAZOLAM HCL 2 MG/2ML IJ SOLN
INTRAMUSCULAR | Status: AC | PRN
  Administered 2015-04-13 (×2): 1 mg via INTRAVENOUS

## 2015-04-13 NOTE — Progress Notes (Signed)
[facility] PROGRESS NOTE  Diana Pearson GHW:[id number] DOB: [birth year] DOA: 04/06/2015 PCP: No PCP Per Patient  81 year old female from [place] Arabic speaking with history of atrial fibrillation, hepatitis C with cirrhosis, aortic stenosis, hypothyroidism who presented from the infectious disease office with nausea, jaundice, epigastric abdominal pain associated with Zepatier which was started 20 days prior to admission for treatment of her hepatitis C. 10 days prior to admission patient noticed she started to get jaundiced , Lab work obtained showed a bilirubin level of 23.5 without phosphatase of 294 abdomen of 2.7 AST 159 ALT of 93 -Since admission, she was found to have a liver mass and  high grade stenosis of the common hepatic duct, underwent brush biopsy and ext biliary drain placement 9/10 with plans for internalization of drains. -Bili improving, mentation stable -Gi signed off, IR following -Biopsy non diagnostic will need to be repeated  Assessment/Plan: Cholestatic jaundice/Liver mass -CT abd pelvis with 2.9x2.2cm mass in R liver, likely HCC VS [place]  tumor, AFP 46. -IR and GI following, S/P Percutaneous Transhepatic cholangiogram, with external biliary drain placement and endoluminal biopsy by Dr. Earleen Newport on 04/09/15,  -Path: Non Diagnostic,discussed with IR repeat biopsy a during  drains internalization. -Bili  improving, mentation clear, no asterixis, PT/INR stable -Acute hepatitis panel negative except HCV Ab -CMV IgM and IgG elevate, but it is unlikely the cause of her illness , as AST/LAT be significantly higher , ID and appropriately appreciated .   Hypertension -stable, continue metoprolol.  Hypothyroidism Continue levothyroxine  Hyperlipidemia  stopped statin due to liver failure.  Atrial fibrillation Rate controlled with metoprolol. Not on anticoagulation. She is followed by Dr. Dorris Carnes.  Aortic stenosis. Recent echo 4/16 shows severe stenosis of the  aortic valve and mildly increased systolic pressure. No wall motion abnormalities. LV EF was 60-65%. FU with Cards  DVT proph: stopped heparin, since PT elevated from cirrhosis  Family Communication: none at bedside Code Status: Full.  Dispo: home pending workup  Consultants: GI ID  HPI/Subjective: Feels ok, mild abd discomfort, NPO for possible drain exchange  Objective: Filed Vitals:   04/13/15 1000  BP: 114/57  Pulse: 54  Temp: 98 F (36.7 C)  Resp: 18    Intake/Output Summary (Last 24 hours) at 04/13/15 1320 Last data filed at 04/13/15 0900  Gross per 24 hour  Intake     60 ml  Output    565 ml  Net   -505 ml   Filed Weights   04/10/15 2149 04/11/15 2100 04/12/15 2230  Weight: 52.6 kg (115 lb 15.4 oz) 52.2 kg (115 lb 1.3 oz) 53 kg (116 lb 13.5 oz)    Exam:   General:  AAOx3, profoundly icteric  Cardiovascular: V8L3/YBO, systolic murmur  Respiratory: CTAB  Abdomen: soft, mild R sided tenderness, nondistended , BS present  Musculoskeletal: no edema c/c   Data Reviewed: Basic Metabolic Panel:  Recent Labs Lab 04/09/15 0535 04/10/15 1000 04/11/15 0450 04/12/15 0546 04/13/15 0420  NA 134* 135 133* 135 135  K 4.5 4.8 4.5 4.2 4.3  CL 101 102 102 103 102  CO2 24 24 25 25 25   GLUCOSE 94 131* 96 89 82  BUN 9 15 13 11 13   CREATININE 0.75 1.01* 0.80 0.73 0.79  CALCIUM 8.3* 8.3* 7.9* 7.9* 7.9*   Liver Function Tests:  Recent Labs Lab 04/09/15 0535 04/10/15 1000 04/11/15 0450 04/12/15 0546 04/13/15 0420  AST 161* 122* 76* 67* 65*  ALT 84* 72* 57* 48 39  ALKPHOS 290* 244* 194* 179* 158*  BILITOT 21.4* 21.5* 15.3* 14.1* 13.8*  PROT 6.4* 6.4* 6.0* 6.1* 6.0*  ALBUMIN 2.1* 2.2* 2.0* 1.9* 1.9*   No results for input(s): LIPASE, AMYLASE in the last 168 hours. No results for input(s): AMMONIA in the last 168 hours. CBC:  Recent Labs Lab 04/09/15 0535 04/10/15 1000 04/11/15 0450 04/12/15 0546 04/13/15 0420  WBC 7.0 8.7 6.9 7.0 7.3  HGB  13.2 13.9 12.9 12.7 13.0  HCT 37.7 39.9 37.1 36.0 37.0  MCV 89.3 90.9 89.6 90.0 91.4  PLT 177 204 172 163 181   Cardiac Enzymes: No results for input(s): CKTOTAL, CKMB, CKMBINDEX, TROPONINI in the last 168 hours. BNP (last 3 results) No results for input(s): BNP in the last 8760 hours.  ProBNP (last 3 results) No results for input(s): PROBNP in the last 8760 hours.  CBG: No results for input(s): GLUCAP in the last 168 hours.  No results found for this or any previous visit (from the past 240 hour(s)).   Studies: No results found.  Scheduled Meds: . levothyroxine  88 mcg Oral QAC breakfast  . metoprolol tartrate  12.5 mg Oral BID  . polyethylene glycol  17 g Oral Daily   Continuous Infusions:  Antibiotics Given (last 72 hours)    None      Principal Problem:   Acute liver failure Active Problems:   Chronic hepatitis C without hepatic coma   Hepatic cirrhosis   Hypertension   Hypothyroidism   Jaundice   Cirrhosis   Liver mass    Time spent: 29min    ELGERGAWY, DAWOOD  MD [facility] Pager [phone]  If 7PM-7AM, please contact night-coverage at [email or web address], password [facility] 04/13/2015, 1:20 PM  LOS: 7 days

## 2015-04-13 NOTE — Procedures (Signed)
Proc:  1)New L Int/Ext biliary drian 2)R Ext biilary drain exchange (Coudl not get across on R)  Find: Central obstruction  Comp/EBL: none.  Plan: Return next week to internalize R biliary drain

## 2015-04-14 ENCOUNTER — Encounter (HOSPITAL_COMMUNITY): Payer: Self-pay | Admitting: Radiology

## 2015-04-14 ENCOUNTER — Inpatient Hospital Stay (HOSPITAL_COMMUNITY): Payer: Medicaid Other

## 2015-04-14 LAB — COMPREHENSIVE METABOLIC PANEL
ALBUMIN: 2.2 g/dL — AB (ref 3.5–5.0)
ALT: 41 U/L (ref 14–54)
ANION GAP: 10 (ref 5–15)
AST: 80 U/L — AB (ref 15–41)
Alkaline Phosphatase: 175 U/L — ABNORMAL HIGH (ref 38–126)
BUN: 18 mg/dL (ref 6–20)
CHLORIDE: 102 mmol/L (ref 101–111)
CO2: 23 mmol/L (ref 22–32)
Calcium: 8 mg/dL — ABNORMAL LOW (ref 8.9–10.3)
Creatinine, Ser: 1.15 mg/dL — ABNORMAL HIGH (ref 0.44–1.00)
GFR calc Af Amer: 50 mL/min — ABNORMAL LOW (ref >60)
GFR calc non Af Amer: 43 mL/min — ABNORMAL LOW (ref >60)
GLUCOSE: 94 mg/dL (ref 65–99)
POTASSIUM: 5 mmol/L (ref 3.5–5.1)
SODIUM: 135 mmol/L (ref 135–145)
TOTAL PROTEIN: 6.8 g/dL (ref 6.5–8.1)
Total Bilirubin: 15.8 mg/dL — ABNORMAL HIGH (ref 0.3–1.2)

## 2015-04-14 LAB — CBC
HEMATOCRIT: 40.8 % (ref 36.0–46.0)
HEMOGLOBIN: 13.9 g/dL (ref 12.0–15.0)
MCH: 31.4 pg (ref 26.0–34.0)
MCHC: 34.1 g/dL (ref 30.0–36.0)
MCV: 92.1 fL (ref 78.0–100.0)
Platelets: 208 10*3/uL (ref 150–400)
RBC: 4.43 MIL/uL (ref 3.87–5.11)
RDW: 18.7 % — ABNORMAL HIGH (ref 11.5–15.5)
WBC: 9 10*3/uL (ref 4.0–10.5)

## 2015-04-14 MED ORDER — SODIUM CHLORIDE 0.9 % IV SOLN
INTRAVENOUS | Status: DC
  Administered 2015-04-14 – 2015-04-15 (×3): via INTRAVENOUS

## 2015-04-14 MED ORDER — IOHEXOL 300 MG/ML  SOLN
60.0000 mL | Freq: Once | INTRAMUSCULAR | Status: AC | PRN
  Administered 2015-04-14: 60 mL via INTRAVENOUS

## 2015-04-14 NOTE — Progress Notes (Signed)
Marland Kitchen    HEMATOLOGY/ONCOLOGY CONSULTATION NOTE  Date of Service: 04/14/2015  Patient Care Team: No Pcp Per Patient as PCP - General (General Practice) Inpatient Attending: .Albertine Patricia, MD  CHIEF COMPLAINTS/PURPOSE OF CONSULTATION:  Concern for hepatocellular carcinoma versus cholangiocarcinoma in the hilar area  HISTORY OF PRESENTING ILLNESS:  Diana Pearson is a wonderful 81 y.o. female who has been referred to [place] by Dr .Albertine Patricia, MD  for evaluation and management of newly diagnosed cholangiocarcinoma vs hepatocellular carcinoma.  Patient has a history of hepatitis C with cirrhosis, Dyslipidemia, atrial fibrillation on anticoagulation, hypothyroidism, osteoporosis presented to the hospital and was admitted on 04/06/2015 for painless jaundice and was noted to have a bilirubin level of 23.5. This apparently happened very quickly about 10 days following the initiation of Zepatier for Hep C treatment.  Patient subsequently had a CT scan of her abdomen and pelvis which showed a 2.9 x 2.2 cm mass within the central segment 8 of the right lobe of the liver which demonstrated progressive heterogeneous enhancement.  There was marked diffuse intrahepatic ductal dilatation.  There was enhancing tumor thrombus at the main portal vein bifurcation extending into the proximal left and right portal vein branches.  No additional liver masses were noted. Mild porta hepatis and gastrohepatic ligament lymphadenopathy. Concern for hepatocellular carcinoma versus hilar cholangiocarcinoma.  Patient was seen by gastroenterology and interventional radiology.  Had a percutaneous transhepatic cholangiogram with external biliary drain placement and endoluminal biopsy on 04/09/2015 which was nondiagnostic on cytology.  Cytology was resent during the right liver external drainage insertion on 04/13/15 which showed epithelial cells with atypia non-diagnostic of malignancy. Patient currently has a left  internal/external biliary drain but the right external biliary drain could not cross the obstruction on the right side.  Patient's bilirubin levels and improved to about 15.  Still notes significant jaundice.  No significant abdominal pain.  No significant dyspepsia and anorexia.  I discussed the case with the hospitalist.  He noted that the case was discussed with the surgeons including Dr. Barry Dienes who hospitalist reported to not feel the patient is a surgical candidate both from a technical standpoint as well as an overall health standpoint.  I saw the patient and discussed at length with her daughter the current findings.  Her daughter is wondering if there is a way to get a second surgical opinion.  The daughter and the patient both choose to have the daughter get all the information and the patient does not want to know much.  Currently denies any other symptoms.    MEDICAL HISTORY:  Past Medical History  Diagnosis Date  . Hypothyroidism   . Osteoporosis   . Atrial fibrillation     no anticoagulation.   . Jaundice 04/06/2015    Sudden onset, 10 days following initiation of Zepatier for Hep C.   . Abdominal distention 04/06/2015  . Aortic stenosis 10/2014    by echo.   . Hypercholesterolemia   . History of blood transfusion ~ 2006    "related to bleeding stomach ulcer"  . Bleeding stomach ulcer ~ 2006  . Hepatitis C ~ 2006    needle stick in 1970s    SURGICAL HISTORY: Past Surgical History  Procedure Laterality Date  . Hip fracture surgery Right 2012  . Fracture surgery    . Cataract extraction w/ intraocular lens  implant, bilateral Bilateral 1990's    SOCIAL HISTORY: Social History   Social History  . Marital Status: Widowed  Spouse Name: N/A  . Number of Children: N/A  . Years of Education: N/A   Occupational History  . Not on file.   Social History Main Topics  . Smoking status: Former Smoker -- 0.12 packs/day for 10 years    Types: Cigarettes  . Smokeless  tobacco: Never Used     Comment: "quit smoking in the 1990's"  . Alcohol Use: No  . Drug Use: No  . Sexual Activity: No   Other Topics Concern  . Not on file   Social History Narrative    FAMILY HISTORY: Family History  Problem Relation Age of Onset  . Stroke Mother     ALLERGIES:  has No Known Allergies.  MEDICATIONS:  Current Facility-Administered Medications  Medication Dose Route Frequency Provider Last Rate Last Dose  . 0.9 %  sodium chloride infusion   Intravenous Continuous Albertine Patricia, MD 75 mL/hr at 04/14/15 1400    . alum & mag hydroxide-simeth (MAALOX/MYLANTA) 200-200-20 MG/5ML suspension 30 mL  30 mL Oral Q6H PRN Melton Alar, PA-C      . HYDROmorphone (DILAUDID) injection 0.5-1 mg  0.5-1 mg Intravenous Q3H PRN Domenic Polite, MD   1 mg at 04/13/15 1832  . iohexol (OMNIPAQUE) 300 MG/ML solution 50 mL  50 mL Intravenous Once PRN Medication Radiologist, MD   35 mL at 04/09/15 1307  . iohexol (OMNIPAQUE) 300 MG/ML solution 50 mL  50 mL Intravenous Once PRN Medication Radiologist, MD   15 mL at 04/13/15 1518  . levothyroxine (SYNTHROID, LEVOTHROID) tablet 88 mcg  88 mcg Oral QAC breakfast Melton Alar, PA-C   88 mcg at 04/14/15 5956  . metoprolol tartrate (LOPRESSOR) tablet 12.5 mg  12.5 mg Oral BID Melton Alar, PA-C   12.5 mg at 04/14/15 1100  . ondansetron (ZOFRAN) tablet 4 mg  4 mg Oral Q6H PRN Melton Alar, PA-C       Or  . ondansetron [facility]) injection 4 mg  4 mg Intravenous Q6H PRN Melton Alar, PA-C   4 mg at 04/11/15 2130  . ondansetron (ZOFRAN) tablet 4 mg  4 mg Oral Q8H PRN Melton Alar, PA-C      . polyethylene glycol (MIRALAX / GLYCOLAX) packet 17 g  17 g Oral Daily Domenic Polite, MD   17 g at 04/14/15 1100    REVIEW OF SYSTEMS:    10 Point review of Systems was done is negative except as noted above.  PHYSICAL EXAMINATION: ECOG PERFORMANCE STATUS: 2 - Symptomatic, <50% confined to bed  .@VITALS2 @ [facility] Weights   04/11/15  2100 04/12/15 2230 04/13/15 1900  Weight: 115 lb 1.3 oz (52.2 kg) 116 lb 13.5 oz (53 kg) 112 lb 7 oz (51 kg)   .Body mass index is 21.5 kg/(m^2).  GENERAL:alert, overtly icteric skin SKIN: icteric /jaundiced skin EYES: icteric sclera OROPHARYNX:no exudate, no erythema and lips, buccal mucosa, and tongue normal  NECK: supple, no JVD, thyroid normal size, non-tender, without nodularity LYMPH:  no palpable lymphadenopathy in the cervical, axillary or inguinal LUNGS: clear to auscultation with normal respiratory effort HEART: regular rate & rhythm,  no murmurs and no lower extremity edema ABDOMEN: abd mild distended. normoactive bowel sounds. Musculoskeletal: no cyanosis of digits and no clubbing  PSYCH: alert & oriented x 3 with fluent speech NEURO: no focal motor/sensory deficits  LABORATORY DATA:  I have reviewed the data as listed  . CBC Latest Ref Rng 04/14/2015 04/13/2015 04/12/2015  WBC 4.0 - 10.5 K/uL  9.0 7.3 7.0  Hemoglobin 12.0 - 15.0 g/dL 13.9 13.0 12.7  Hematocrit 36.0 - 46.0 % 40.8 37.0 36.0  Platelets 150 - 400 K/uL 208 181 163    . CMP Latest Ref Rng 04/14/2015 04/13/2015 04/12/2015  Glucose 65 - 99 mg/dL 94 82 89  BUN 6 - 20 mg/dL 18 13 11   Creatinine 0.44 - 1.00 mg/dL 1.15(H) 0.79 0.73  Sodium 135 - 145 mmol/L 135 135 135  Potassium 3.5 - 5.1 mmol/L 5.0 4.3 4.2  Chloride 101 - 111 mmol/L 102 102 103  CO2 22 - 32 mmol/L 23 25 25   Calcium 8.9 - 10.3 mg/dL 8.0(L) 7.9(L) 7.9(L)  Total Protein 6.5 - 8.1 g/dL 6.8 6.0(L) 6.1(L)  Total Bilirubin 0.3 - 1.2 mg/dL 15.8(H) 13.8(H) 14.1(H)  Alkaline Phos 38 - 126 U/L 175(H) 158(H) 179(H)  AST 15 - 41 U/L 80(H) 65(H) 67(H)  ALT 14 - 54 U/L 41 39 48     RADIOGRAPHIC STUDIES: I have personally reviewed the radiological images as listed and agreed with the findings in the report. Ct Abdomen Pelvis W Wo Contrast  04/08/2015   CLINICAL DATA:  81 year old female with hepatitis-C, jaundice, hypothyroidism and abdominal distention.  Possible portal vein thrombosis on recent sonogram.  EXAM: CT ABDOMEN AND PELVIS WITHOUT AND WITH CONTRAST  TECHNIQUE: Multidetector CT imaging of the abdomen and pelvis was performed following the standard protocol before and following the bolus administration of intravenous contrast.  CONTRAST:  125mL OMNIPAQUE IOHEXOL 300 MG/ML  SOLN  COMPARISON:  04/07/2015 abdominal sonogram.  FINDINGS: Lower chest: Cardiomegaly. There is atherosclerosis of the thoracic aorta, the great vessels of the mediastinum and the coronary arteries, including calcified atherosclerotic plaque in the left anterior descending, left circumflex and right coronary arteries. Aortic valvular and mitral annular calcifications are present. Subsegmental dependent atelectasis at both lung bases.  Hepatobiliary: The liver surface is diffusely irregular and there is asymmetric hypertrophy of the lateral left liver lobe, indicating cirrhosis. There is an ill-defined 2.9 x 2.2 cm liver mass within the central segment 8 right liver lobe (series 501/image 49), which demonstrates progressive heterogeneous enhancement. There is marked diffuse intrahepatic biliary ductal dilatation. There is an enhancing tumor thrombus at the main portal vein bifurcation extending into the proximal left and right portal vein branches. The gallbladder is contracted. The common bile duct is normal caliber (4 mm diameter). There are no additional liver masses.  Pancreas: No pancreatic mass. Ill-defined peripancreatic fluid throughout the length of the pancreas. No focal peripancreatic fluid collection. No main pancreatic duct dilation.  Spleen: Top normal size spleen (12.2 cm craniocaudal splenic length). No splenic mass.  Adrenals/Urinary Tract: Normal adrenals. Simple 1.0 cm posterior interpolar left renal cyst. Simple 1.1 cm exophytic lower left renal cyst. Additional subcentimeter hypodense left renal lesions, too small to characterize. No hydronephrosis. No  nephrolithiasis. Visualized ureters are normal caliber. On delayed imaging, there is no urothelial wall thickening and there are no filling defects in the opacified portions of the bilateral collecting systems or visualized proximal ureters.  Stomach/Bowel: Relatively collapsed and grossly normal stomach normal caliber small bowel, with no appreciable small bowel wall thickening normal appendix. Normal large bowel, with no large bowel wall thickening.  Vascular/Lymphatic: Atherosclerotic nonaneurysmal abdominal aorta. Patent splenic, hepatic and renal veins. Enlarged 1.4 cm short axis gastrohepatic ligament lymph node (series 501/ image 65). Mild porta hepatis lymphadenopathy.  Reproductive: Grossly normal uterus. Simple 2.3 cm left adnexal cystic structure (series 501/image 152).  Other: Small volume ascites.  No pneumoperitoneum.  Musculoskeletal: Partially visualized right total hip arthroplasty. Mild T10 and L1 vertebral body compression fractures and severe L2 vertebral body compression fracture of indeterminate chronicity. Mild to moderate degenerative changes in the visualized thoracolumbar spine. No aggressive appearing focal osseous lesions.  IMPRESSION: 1. 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 A cholangiocarcinoma ([place] tumor) is favored given the location and biliary ductal dilatation, however hepatocellular carcinoma is on the differential given the underlying cirrhosis and the presence of portal vein tumor thrombus. 2. Small volume ascites.  Top-normal size spleen. 3. Mild porta hepatis and gastrohepatic ligament lymphadenopathy, nonspecific. 4. Mild T10 and L1 and superior L2 vertebral body compression fractures of indeterminate chronicity, likely chronic. 5. Cardiomegaly. Atherosclerosis, including  three-vessel coronary artery disease. Please note that although the presence of coronary artery calcium documents the presence of coronary artery disease, the severity of this disease and any potential stenosis cannot be assessed on this non-gated CT examination. 6. Simple benign-appearing 2.3 cm left adnexal cyst. No follow-up is required. This recommendation follows ACR consensus guidelines: White Paper of the ACR Incidental Findings Committee II on Adnexal Findings. J Am Coll Radiol [phone]. These results were called by telephone at the time of interpretation on 04/08/2015 at 8:55 am to Dr. Nicoletta Ba , who verbally acknowledged these results.   Electronically Signed   By: Ilona Sorrel M.D.   On: 04/08/2015 09:06   US Abdomen Complete  04/06/2015   CLINICAL DATA:  Acute liver failure.  Jaundice.  EXAM: ULTRASOUND ABDOMEN COMPLETE  COMPARISON:  None.  FINDINGS: Gallbladder: Gallbladder is not visualized and may be surgically absent or contracted.  Common bile duct: Diameter: 5.3 mm, normal  Liver: Coarsened parenchymal echotexture with nodular contour suggesting hepatic cirrhosis. Prominence of portal veins with flow in the appropriate direction. Suggestion of mild intrahepatic bile duct dilatation. No focal liver lesions identified.  IVC: No abnormality visualized.  Pancreas: Visualized portion unremarkable.  Spleen: Spleen appears enlarged with homogeneous parenchymal echotexture.  Right Kidney: Length: 10.3 cm. Echogenicity within normal limits. No mass or hydronephrosis visualized.  Left Kidney: Length: 9.6 cm. Small cyst demonstrated in the lower pole measuring up to about 1 cm diameter. No hydronephrosis.  Abdominal aorta: No aneurysm visualized.  Other findings: None.  IMPRESSION: Coarsened liver echotexture with nodular contour and prominent pulmonary venous structures consistent with hepatic cirrhosis. No focal lesions identified. Suggestion of intrahepatic bile duct dilatation without  extrahepatic ductal dilatation. Gallbladder not visualized. Probable splenic enlargement.   Electronically Signed   By: Lucienne Capers M.D.   On: 04/06/2015 23:51   [place] Art/ven Flow Abd Pelv Doppler  04/07/2015   CLINICAL DATA:  Jaundice, hepatic cirrhosis.  EXAM: DUPLEX ULTRASOUND OF LIVER  TECHNIQUE: Color and duplex Doppler ultrasound was performed to evaluate the hepatic in-flow and out-flow vessels. Exam is significantly limited as patient does not speak English and unable to follow breathing instructions.  COMPARISON:  None.  FINDINGS: Portal Vein Velocities  Main: 18.1 cm/sec proximally ; hepatopetal flow is noted proximally and in the midportion, but no flow is noted distally consistent with portal venous thrombus.  Right:  Not visualized due to limitations described above.  Left:  Not visualized due to limitations described above.  Hepatic Vein Velocities  Right:  20.5 cm/sec ; hepatofugal flow is noted.  Middle:  Not visualized  due to limitations described above.  Left:  29.7 Cm/sec ; hepatofugal flow is noted.  Hepatic Artery Velocity:  146 cm/sec  Splenic Vein Velocity:  17.1 cm/sec  Varices: Present.  Ascites: Present.  IMPRESSION: Exam is significantly limited as patient does not speak English and unable to follow breathing instructions. There is probable occlusive thrombus seen in the distal portal vein. Middle hepatic vein is not visualized due to previously described limitations, but right and left hepatic veins demonstrate normal flow and velocity. These results will be called to the ordering clinician or representative by the Radiologist Assistant, and communication documented in the PACS or zVision Dashboard.   Electronically Signed   By: Marijo Conception, M.D.   On: 04/07/2015 08:12   Ir Biliary Drain Placement With Cholangiogram  04/09/2015   CLINICAL DATA:  81 year old female presents with jaundice, hyperbilirubinemia, and imaging diagnosis of hilar tumor in the liver. CT demonstrates  intrahepatic biliary ductal dilatation.  She has been referred for percutaneous transhepatic cholangiogram and drainage.  EXAM: IR BILIARY DRAIN PLACEMENT W/ CHOLANGIOGRAM; IR ENDOLUMNICAL BIOPSY OF BILIARY TREE  COMPARISON:  CT 04/07/2015  ANESTHESIA/SEDATION: 4.0 IV Versed; 2 high mcg IV Fentanyl.  Total Moderate Sedation Time: 45 minutes.  CONTRAST:  9mL OMNIPAQUE IOHEXOL 300 MG/ML  SOLN  MEDICATIONS: Receiving Zosyn  Procedural  FLUOROSCOPY TIME:  18 minutes 36 second  TECHNIQUE: The procedure, risks, benefits, and alternatives were explained to the patient and the patient's family. A complete informed consent was performed, with risk benefit analysis. Specific risks that were discussed for the procedure include bleeding, infection, biliary sepsis, IC use day, organ injury, need for further procedure, need for further surgery, long-term drain placement, cardiopulmonary collapse, death. Questions regarding the procedure were encouraged and answered. The patient understands and consents to the procedure.  Patient is position in supine position on the fluoroscopy table, and the upper abdomen was prepped and draped in the usual sterile fashion. Maximum barrier sterile technique with sterile gowns and gloves were used for the procedure. A timeout was performed prior to the initiation of the procedure. Local anesthesia was provided with 1% lidocaine with epinephrine.  Ultrasound survey of the right liver lobe was performed. Window into the right biliary system was present for the right liver approach.  1% lidocaine was used for local anesthesia, with generous infiltration of the skin and subcutaneous tissues in and inter left costal location. A Chiba needle was advanced under ultrasound guidance into the right liver lobe.  Once the tip of the needle was confirmed within the biliary system by injecting small aliquots of contrast, images were stored of the biliary system after partially opacifying the biliary tree via  the needle.  Once the tip of this needle was confirmed within the biliary system, an 018 wire was advanced centrally. The needle was removed, a small incision was made with an 11 blade scalpel.  An Accustick system would not advance into the biliary system, and [place] a micropuncture system was advanced over the 018 wire into the biliary tree. The inner dilator and wire were removed, and an 035 catheter was advanced into the biliary system. A standard 6 French sheath was then placed in the biliary system.  Cholangiogram was then performed.  A combination of Glidewire, 4 [place] glide cath, 5 [place] cobra catheter, as well as a 6 [place] beacon tip 35 cm sheath used to attempt to navigate through the obstruction with no success.  A standard pigtail 10 French drainage catheter was  placed over the [facility] wire.  We confirmed placement with contrast infusion.  The patient tolerated the procedure well and remained hemodynamically stable throughout.  No complications were encountered and no significant blood loss was encountered.  COMPLICATIONS: None  FINDINGS: Ultrasound survey demonstrates dilated intrahepatic biliary ducts. Approach into the right liver lobe was identified.  Percutaneous transhepatic cholangiogram demonstrates moderate to severely dilated intrahepatic biliary ducts. There is communication of the left and right-sided ducts, and the os a right and left drain may not be required for drainage.  Dilated ducts of the segment 4B should not be confused for common hepatic duct. Common hepatic duct appears to be closer to the patient's right side.  Small amount of contrast did opacified I the common hepatic duct and traversed the common bile duct into the duodenum. Cystic duct opacified with incomplete filling of the gallbladder.  Unable to traverse the obstruction from our initial puncture.  IMPRESSION: Status post percutaneous transhepatic cholangiogram with demonstration of high-grade stenosis of the common hepatic  duct, and subsequent placement of externalized standard 10 French pigtail catheter drain.  Communication of left and right-sided ducts was demonstrated.  Signed,  Dulcy Fanny. Earleen Newport, DO  [facility]  [facility]  PLAN: Would recommend repeat through the tube cholangiogram on Monday or Tuesday of the upcoming week for drainage catheter exchange and a second attempt at traversing the obstruction.  Twice a day 10 cc saline flushes until drain exchange.   Electronically Signed   By: Corrie Mckusick D.O.   On: 04/09/2015 13:51   Ir Endoluminal Bx Of Biliary Tree  04/09/2015   CLINICAL DATA:  81 year old female presents with jaundice, hyperbilirubinemia, and imaging diagnosis of hilar tumor in the liver. CT demonstrates intrahepatic biliary ductal dilatation.  She has been referred for percutaneous transhepatic cholangiogram and drainage.  EXAM: IR BILIARY DRAIN PLACEMENT W/ CHOLANGIOGRAM; IR ENDOLUMNICAL BIOPSY OF BILIARY TREE  COMPARISON:  CT 04/07/2015  ANESTHESIA/SEDATION: 4.0 IV Versed; 2 high mcg IV Fentanyl.  Total Moderate Sedation Time: 45 minutes.  CONTRAST:  44mL OMNIPAQUE IOHEXOL 300 MG/ML  SOLN  MEDICATIONS: Receiving Zosyn  Procedural  FLUOROSCOPY TIME:  18 minutes 36 second  TECHNIQUE: The procedure, risks, benefits, and alternatives were explained to the patient and the patient's family. A complete informed consent was performed, with risk benefit analysis. Specific risks that were discussed for the procedure include bleeding, infection, biliary sepsis, IC use day, organ injury, need for further procedure, need for further surgery, long-term drain placement, cardiopulmonary collapse, death. Questions regarding the procedure were encouraged and answered. The patient understands and consents to the procedure.  Patient is position in supine position on the fluoroscopy table, and the upper abdomen was prepped and draped in the usual sterile fashion. Maximum barrier  sterile technique with sterile gowns and gloves were used for the procedure. A timeout was performed prior to the initiation of the procedure. Local anesthesia was provided with 1% lidocaine with epinephrine.  Ultrasound survey of the right liver lobe was performed. Window into the right biliary system was present for the right liver approach.  1% lidocaine was used for local anesthesia, with generous infiltration of the skin and subcutaneous tissues in and inter left costal location. A Chiba needle was advanced under ultrasound guidance into the right liver lobe.  Once the tip of the needle was confirmed within the biliary system by injecting small aliquots of contrast, images were stored of the biliary system after partially opacifying the biliary tree  via the needle.  Once the tip of this needle was confirmed within the biliary system, an 018 wire was advanced centrally. The needle was removed, a small incision was made with an 11 blade scalpel.  An Accustick system would not advance into the biliary system, and [place] a micropuncture system was advanced over the 018 wire into the biliary tree. The inner dilator and wire were removed, and an 035 catheter was advanced into the biliary system. A standard 6 French sheath was then placed in the biliary system.  Cholangiogram was then performed.  A combination of Glidewire, 4 [place] glide cath, 5 [place] cobra catheter, as well as a 6 [place] beacon tip 35 cm sheath used to attempt to navigate through the obstruction with no success.  A standard pigtail 10 French drainage catheter was placed over the [employer].  We confirmed placement with contrast infusion.  The patient tolerated the procedure well and remained hemodynamically stable throughout.  No complications were encountered and no significant blood loss was encountered.  COMPLICATIONS: None  FINDINGS: Ultrasound survey demonstrates dilated intrahepatic biliary ducts. Approach into the right liver lobe was  identified.  Percutaneous transhepatic cholangiogram demonstrates moderate to severely dilated intrahepatic biliary ducts. There is communication of the left and right-sided ducts, and the os a right and left drain may not be required for drainage.  Dilated ducts of the segment 4B should not be confused for common hepatic duct. Common hepatic duct appears to be closer to the patient's right side.  Small amount of contrast did opacified I the common hepatic duct and traversed the common bile duct into the duodenum. Cystic duct opacified with incomplete filling of the gallbladder.  Unable to traverse the obstruction from our initial puncture.  IMPRESSION: Status post percutaneous transhepatic cholangiogram with demonstration of high-grade stenosis of the common hepatic duct, and subsequent placement of externalized standard 10 French pigtail catheter drain.  Communication of left and right-sided ducts was demonstrated.  Signed,  Dulcy Fanny. Earleen Newport, DO  [facility]  [facility]  PLAN: Would recommend repeat through the tube cholangiogram on Monday or Tuesday of the upcoming week for drainage catheter exchange and a second attempt at traversing the obstruction.  Twice a day 10 cc saline flushes until drain exchange.   Electronically Signed   By: Corrie Mckusick D.O.   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  The bilirubin on admission was 23.5.  Her CT of the abdomen and pelvis showed right hepatic lobe mass concerning for hepatocellular carcinoma versus any hilar cholangiocarcinoma/Klatskins tumor.concern for portal vein tumor infiltration. Patient has had left-sided external/internal biliary drain placed but  right-sided is only external. Brush biopsy was done x2 are nondiagnostic. AFP tumor marker 46.6. CA 19-9 was ordered and is currently pending. Plan -We have made surgery input about it at the patient has technically resectable disease and if they think she is a candidate for resection.  Would have surgeons recommend alternative physicians for expert opinion regarding surgery as per patient's daughters wish. -it should be noted that staging testing for cholangiocarcinomas have poor sensitivity and that several tumor deemed to be resectable pre-operatively might not actually be resectable when the surgeons go in. -also patient with good resections might still have a high  risk of recurrence. -would have IR weight in regarding possible arterial embolization bland vs chemotherapy is an options. (typically more useful with HCC and intra-hepatic cholangiocarcinomas. -1st priority is tissue diagnosis with biopsy and adequate biliary drainage to improve bilirubin levels. -CT chest to r/o pulmonary mets. -CA 19-9 -consider MRI liver/abd for closer look to r/o liver/peritoneal mets. -if Surgery or local IR therapies at not possible we will need to consider palliative treatment options - no standard of care - if LFTs better would consider chemo-radiation with RT + gemcitabine or 5FU or capecitabine follow by chemotherapy. -dietary referral.  -discussed findings in detail with patients daughter. Give her my clinic card to set up followup. -ongoing goals of care discussion  #2 Hepatitis C and cirrhosis - patient on started on Zepatier about 10 days prior to admission.  #3 Osteoporosis with osteoporotic spine fractures. -fall precautions.  #4 Atrial fibrillation - not on anticoagulation  #5 Aortic stenosis and CT evidence of coronary calcium depositions concerning for multivessel coronary artery disease   All of the patients questions were answered with apparent satisfaction. The patient knows to call  the clinic with any problems, questions or concerns.  I spent 60 minutes counseling the patient face to face. The total time spent in the appointment was 80 minutes and more than 50% was on counseling and direct patient cares.    Sullivan Lone MD [place] AAHIVMS [facility] Hematology/Oncology Physician [facility]  (Office):       [phone] (Work cell):  [phone] (Fax):           [phone]  04/14/2015 5:44 PM

## 2015-04-14 NOTE — Progress Notes (Signed)
Chief Complaint: Klatskin's tumor, obstructive jaunice  Subjective: Pt came back to IR yesterday, could not get across obstruction via right tube, tube was exchanged. New left approach tube placed and able to get across obstruction, this tube is internal/extenral.  Pt feels ok, some soreness, continues to report leakage from around right tube at skin site.  Allergies: Review of patient's allergies indicates no known allergies.  Medications: Prior to Admission medications   Medication Sig Start Date End Date Taking? Authorizing Provider  levothyroxine (SYNTHROID, LEVOTHROID) 88 MCG tablet Take 88 mcg by mouth daily before breakfast.   Yes Historical Provider, MD  metoprolol tartrate (LOPRESSOR) 25 MG tablet Take 0.5 tablets (12.5 mg total) by mouth 2 (two) times daily. 10/11/14  Yes Fay Records, MD  pravastatin (PRAVACHOL) 20 MG tablet Take 1 tablet (20 mg total) by mouth daily. 10/11/14  Yes Fay Records, MD  ondansetron (ZOFRAN) 4 MG tablet Take 1 tablet (4 mg total) by mouth every 8 (eight) hours as needed for nausea or vomiting. Patient not taking: Reported on 04/06/2015 03/24/15   Thayer Headings, MD     Vital Signs: BP 114/65 mmHg  Pulse 55  Temp(Src) 97.7 F (36.5 C) (Oral)  Resp 18  Wt 112 lb 7 oz (51 kg)  SpO2 98%  Physical Exam (R)tube intact, leaking bilious fluid from skin site. Blood tinged yellow bile in bag (L)tube intact, site clean, dry, output bilious with some debris/sludge   Imaging: No results found.  Labs:  CBC:  Recent Labs  04/11/15 0450 04/12/15 0546 04/13/15 0420 04/14/15 0519  WBC 6.9 7.0 7.3 9.0  HGB 12.9 12.7 13.0 13.9  HCT 37.1 36.0 37.0 40.8  PLT 172 163 181 208    COAGS:  Recent Labs  04/06/15 1405 04/06/15 2034 04/07/15 0523 04/08/15 0720 04/08/15 1316 04/12/15 0546  INR 1.44 1.42 1.47 1.59*  --  1.46  APTT 30  --   --   --  34 33    BMP:  Recent Labs  04/11/15 0450 04/12/15 0546 04/13/15 0420  04/14/15 0519  NA 133* 135 135 135  K 4.5 4.2 4.3 5.0  CL 102 103 102 102  CO2 '25 25 25 23  ' GLUCOSE 96 89 82 94  BUN '13 11 13 18  ' CALCIUM 7.9* 7.9* 7.9* 8.0*  CREATININE 0.80 0.73 0.79 1.15*  GFRNONAA >60 >60 >60 43*  GFRAA >60 >60 >60 50*    LIVER FUNCTION TESTS:  Recent Labs  04/11/15 0450 04/12/15 0546 04/13/15 0420 04/14/15 0519  BILITOT 15.3* 14.1* 13.8* 15.8*  AST 76* 67* 65* 80*  ALT 57* 48 39 41  ALKPHOS 194* 179* 158* 175*  PROT 6.0* 6.1* 6.0* 6.8  ALBUMIN 2.0* 1.9* 1.9* 2.2*    Assessment and Plan: Klatskin's tumor with obstructive jaundice S/p (R)external biliary drain and (L)Int/Ext bilary drain. Bili sl up today to 15.8 (R)drain leaking, likely due to obstruction, can try to keep clean dressing around it, or may need to get WOCN consult to see if they can configure a dressing that will allow drainage into a bag. Monitor T. Bili, plan for reattempt to cross obstruction from (R) next week.  SignedAscencion Dike 04/14/2015, 1:52 PM   I spent a total of 15 Minutes at the the patient's bedside AND on the patient's hospital floor or unit, greater than 50% of which was counseling/coordinating care for biliary obstruction post PTC with (R)and (L) biliary drains.

## 2015-04-14 NOTE — Care Management Note (Signed)
Case Management Note  Patient Details  Name: Diana Pearson MRN: [id number] Date of Birth: Feb 25, 1929  Subjective/Objective:         CM following for progression and d/c planning.           Action/Plan: 04/14/2015 Continuing to follow for d/c planning. Oncology to see this pt and staging to take plan followed by plan of care.   Expected Discharge Date:       04/19/2015           Expected Discharge Plan:  [place]  In-House Referral:  NA  Discharge planning Services  CM Consult  Post Acute Care Choice:    Choice offered to:     DME Arranged:    DME Agency:  [place] Arranged:  RN [facility] Agency:     Status of Service:  In process, will continue to follow  Medicare Important Message Given:    Date Medicare IM Given:    Medicare IM give by:    Date Additional Medicare IM Given:    Additional Medicare Important Message give by:     If discussed at [place] of Stay Meetings, dates discussed:    Additional Comments:  Adron Bene, RN 04/14/2015, 3:13 PM

## 2015-04-14 NOTE — Progress Notes (Signed)
[facility] PROGRESS NOTE  Diana Pearson XNT:[id number] DOB: 12/20/1928 DOA: 04/06/2015 PCP: No PCP Per Patient  81 year old female from [place] Arabic speaking with history of atrial fibrillation, hepatitis C with cirrhosis, aortic stenosis, hypothyroidism who presented from the infectious disease office with nausea, jaundice, epigastric abdominal pain associated with Zepatier which was started 20 days prior to admission for treatment of her hepatitis C. 10 days prior to admission patient noticed she started to get jaundiced , Lab work obtained showed a bilirubin level of 23.5 without phosphatase of 294 abdomen of 2.7 AST 159 ALT of 93 -Since admission, she was found to have a liver mass and  high grade stenosis of the common hepatic duct, underwent brush biopsy and ext biliary drain placement 9/10 with plans for internalization of drains. 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 -IR and GI following, S/P Percutaneous Transhepatic cholangiogram, with external biliary drain placement and endoluminal biopsy by Dr. Earleen Newport on 04/09/15, Patient with you external/internal biliary drain on the left 04/13/15. -Path 04/09/15: Non Diagnostic,  cytology(not biopsy )  was sent again during right liver external drainage insertion 9/14/16showing epithelial cells with Atypia, nondiagnostic of malignancy -Bili  improving, mentation clear, no asterixis, PT/INR stable -Acute hepatitis panel negative except HCV Ab -CMV IgM and IgG elevate, but it is unlikely the cause of her illness , as AST/LAT be significantly higher , ID and appropriately appreciated . - Discussed with oncology who will evaluate the patient today , will obtain CT chest to rule out any metastasis . discussed with surgery ,  cholangiocarcinoma surgery is not performed regularly in our hospital, and she if she is a surgical candidate she needs referral to different hospital.   Hypertension -stable, continue metoprolol.  Hypothyroidism Continue levothyroxine  Hyperlipidemia  stopped statin due to liver failure.  Atrial fibrillation Rate controlled with metoprolol. Not on anticoagulation. She is followed by Dr. Dorris Carnes.  Aortic stenosis. Recent echo 4/16 shows severe stenosis of the aortic valve and mildly increased systolic pressure. No wall motion abnormalities. LV EF was 60-65%. FU with Cards  DVT proph: stopped heparin, since PT elevated from cirrhosis  Family Communication: none at bedside Code Status: Full.  Dispo:pending workup  Consultants: GI ID  HPI/Subjective: Feels ok, mild abd discomfort, NPO for possible drain exchange  Objective: Filed Vitals:   04/14/15 1000  BP: 114/65  Pulse: 55  Temp: 97.7 F (36.5 C)  Resp: 18    Intake/Output Summary (Last 24 hours) at 04/14/15 1507 Last data filed at 04/14/15 1400  Gross per 24 hour  Intake    540 ml  Output    525 ml  Net     15 ml   Filed Weights   04/11/15 2100 04/12/15 2230 04/13/15 1900  Weight: 52.2 kg (115 lb 1.3 oz) 53 kg (116 lb 13.5 oz) 51 kg (112 lb 7 oz)    Exam:   General:  AAOx3, profoundly icteric  Cardiovascular: H6P5/FFM, systolic murmur  Respiratory: CTAB  Abdomen: soft, mild R sided tenderness, nondistended , BS present  Musculoskeletal: no edema c/c   Data Reviewed: Basic Metabolic Panel:  Recent Labs Lab 04/10/15 1000 04/11/15 0450 04/12/15 0546 04/13/15 0420 04/14/15 0519  NA 135 133* 135 135 135  K 4.8 4.5 4.2 4.3 5.0  CL 102  102 103 102 102  CO2 24 25 25 25 23   GLUCOSE 131* 96 89 82 94  BUN 15 13 11 13 18   CREATININE 1.01* 0.80 0.73 0.79 1.15*  CALCIUM 8.3* 7.9* 7.9* 7.9* 8.0*   Liver Function Tests:  Recent Labs Lab 04/10/15 1000 04/11/15 0450 04/12/15 0546  04/13/15 0420 04/14/15 0519  AST 122* 76* 67* 65* 80*  ALT 72* 57* 48 39 41  ALKPHOS 244* 194* 179* 158* 175*  BILITOT 21.5* 15.3* 14.1* 13.8* 15.8*  PROT 6.4* 6.0* 6.1* 6.0* 6.8  ALBUMIN 2.2* 2.0* 1.9* 1.9* 2.2*   No results for input(s): LIPASE, AMYLASE in the last 168 hours. No results for input(s): AMMONIA in the last 168 hours. CBC:  Recent Labs Lab 04/10/15 1000 04/11/15 0450 04/12/15 0546 04/13/15 0420 04/14/15 0519  WBC 8.7 6.9 7.0 7.3 9.0  HGB 13.9 12.9 12.7 13.0 13.9  HCT 39.9 37.1 36.0 37.0 40.8  MCV 90.9 89.6 90.0 91.4 92.1  PLT 204 172 163 181 208   Cardiac Enzymes: No results for input(s): CKTOTAL, CKMB, CKMBINDEX, TROPONINI in the last 168 hours. BNP (last 3 results) No results for input(s): BNP in the last 8760 hours.  ProBNP (last 3 results) No results for input(s): PROBNP in the last 8760 hours.  CBG: No results for input(s): GLUCAP in the last 168 hours.  No results found for this or any previous visit (from the past 240 hour(s)).   Studies: No results found.  Scheduled Meds: . levothyroxine  88 mcg Oral QAC breakfast  . metoprolol tartrate  12.5 mg Oral BID  . polyethylene glycol  17 g Oral Daily   Continuous Infusions: . sodium chloride 75 mL/hr at 04/14/15 1400   Antibiotics Given (last 72 hours)    Date/Time Action Medication Dose Rate   04/13/15 1400 Given   piperacillin-tazobactam (ZOSYN) IVPB 3.375 g 3.375 g 12.5 mL/hr      Principal Problem:   Acute liver failure Active Problems:   Chronic hepatitis C without hepatic coma   Hepatic cirrhosis   Hypertension   Hypothyroidism   Jaundice   Cirrhosis   Liver mass    Time spent: 65min    Diana Marcella  MD [facility] Pager [phone]  If 7PM-7AM, please contact night-coverage at [email or web address], password [facility] 04/14/2015, 3:07 PM  LOS: 8 days

## 2015-04-15 ENCOUNTER — Other Ambulatory Visit: Payer: Self-pay | Admitting: Hematology

## 2015-04-15 DIAGNOSIS — E43 Unspecified severe protein-calorie malnutrition: Secondary | ICD-10-CM

## 2015-04-15 DIAGNOSIS — I81 Portal vein thrombosis: Secondary | ICD-10-CM

## 2015-04-15 DIAGNOSIS — K7460 Unspecified cirrhosis of liver: Secondary | ICD-10-CM

## 2015-04-15 DIAGNOSIS — C221 Intrahepatic bile duct carcinoma: Secondary | ICD-10-CM

## 2015-04-15 DIAGNOSIS — B182 Chronic viral hepatitis C: Secondary | ICD-10-CM

## 2015-04-15 DIAGNOSIS — K7200 Acute and subacute hepatic failure without coma: Principal | ICD-10-CM

## 2015-04-15 DIAGNOSIS — C240 Malignant neoplasm of extrahepatic bile duct: Secondary | ICD-10-CM

## 2015-04-15 LAB — COMPREHENSIVE METABOLIC PANEL
ALBUMIN: 1.8 g/dL — AB (ref 3.5–5.0)
ALK PHOS: 143 U/L — AB (ref 38–126)
ALT: 35 U/L (ref 14–54)
ANION GAP: 8 (ref 5–15)
AST: 72 U/L — AB (ref 15–41)
BUN: 17 mg/dL (ref 6–20)
CALCIUM: 7.4 mg/dL — AB (ref 8.9–10.3)
CO2: 22 mmol/L (ref 22–32)
Chloride: 104 mmol/L (ref 101–111)
Creatinine, Ser: 0.8 mg/dL (ref 0.44–1.00)
GFR calc Af Amer: >60 mL/min (ref >60)
GFR calc non Af Amer: >60 mL/min (ref >60)
GLUCOSE: 93 mg/dL (ref 65–99)
Potassium: 4.4 mmol/L (ref 3.5–5.1)
SODIUM: 134 mmol/L — AB (ref 135–145)
Total Bilirubin: 14.1 mg/dL — ABNORMAL HIGH (ref 0.3–1.2)
Total Protein: 5.9 g/dL — ABNORMAL LOW (ref 6.5–8.1)

## 2015-04-15 MED ORDER — OXYCODONE HCL 5 MG PO TABS: 5.0000 mg | ORAL_TABLET | Freq: Four times a day (QID) | ORAL | Status: AC | PRN

## 2015-04-15 MED ORDER — LEVOTHYROXINE SODIUM 88 MCG PO TABS: 88.0000 ug | ORAL_TABLET | Freq: Every day | ORAL | Status: AC

## 2015-04-15 MED ORDER — ENSURE ENLIVE PO LIQD
237.0000 mL | Freq: Two times a day (BID) | ORAL | Status: DC
  Administered 2015-04-15: 237 mL via ORAL

## 2015-04-15 MED ORDER — ENSURE ENLIVE PO LIQD: 237.0000 mL | Freq: Two times a day (BID) | ORAL | Status: AC

## 2015-04-15 MED ORDER — ONDANSETRON HCL 4 MG PO TABS: 4.0000 mg | ORAL_TABLET | Freq: Four times a day (QID) | ORAL | Status: AC | PRN

## 2015-04-15 MED ORDER — METOPROLOL TARTRATE 25 MG PO TABS: 12.5000 mg | ORAL_TABLET | Freq: Two times a day (BID) | ORAL | Status: AC

## 2015-04-15 NOTE — Care Management Note (Signed)
Case Management Note  Patient Details  Name: Diana Pearson MRN: [id number] Date of Birth: 06-08-1929  Subjective/Objective:         CM following for progression and d/c planning           Action/Plan: Plan to d/c to home with family. [facility] to provide [facility] for drain management, DME requested from [facility]. Followup appointment with [facility] scheduled at the [facility] for Friday, Sept 23, 2016 @ 3pm. Info placed in d/c instructions.   Expected Discharge Date:        04/15/2015          Expected Discharge Plan:  [place]  In-House Referral:  NA  Discharge planning Services  CM Consult  Post Acute Care Choice:    Choice offered to:     DME Arranged:  3-N-1, [place] rolling DME Agency:  [place]:  RN [place] Agency:   [facility]  Status of Service:  Complete  Medicare Important Message Given:    Date Medicare IM Given:    Medicare IM give by:    Date Additional Medicare IM Given:    Additional Medicare Important Message give by:     If discussed at [place] of Stay Meetings, dates discussed:    Additional Comments:  Adron Bene, RN 04/15/2015, 12:21 PM

## 2015-04-15 NOTE — Discharge Summary (Signed)
Diana Pearson, is a 81 y.o. female  DOB 06/23/1929  MRN [id number].  Admission date:  04/06/2015  Admitting Physician  Cristal Ford, DO  Discharge Date:  04/15/2015   Primary MD  No PCP Per Patient  Recommendations for primary care physician for things to follow:  - Patient to follow with sickle cell clinic on scheduled appointment for repeat labs, please continue to monitor LFTs closely. - Patient will be seen by IR next week, the plan is to reattempt to cross obstruction of right biliary drain next week. - Patient will be seen by oncology Dr.Guatam next week. - Patient will be contacted by [facility] hepatobiliary oncology surgery for outpatient appointment.   Admission Diagnosis  jaundice   Discharge Diagnosis  jaundice    Principal Problem:   Acute liver failure Active Problems:   Chronic hepatitis C without hepatic coma   Hepatic cirrhosis   Hypertension   Hypothyroidism   Jaundice   Cirrhosis   Liver mass   Portal vein thrombosis      Past Medical History  Diagnosis Date  . Hypothyroidism   . Osteoporosis   . Atrial fibrillation     no anticoagulation.   . Jaundice 04/06/2015    Sudden onset, 10 days following initiation of Zepatier for Hep C.   . Abdominal distention 04/06/2015  . Aortic stenosis 10/2014    by echo.   . Hypercholesterolemia   . History of blood transfusion ~ 2006    "related to bleeding stomach ulcer"  . Bleeding stomach ulcer ~ 2006  . Hepatitis C ~ 2006    needle stick in 1970s    Past Surgical History  Procedure Laterality Date  . Hip fracture surgery Right 2012  . Fracture surgery    . Cataract extraction w/ intraocular lens  implant, bilateral Bilateral 1990's       History of present illness and  Hospital Course:     Kindly see H&P for history of present illness and admission details, please review complete Labs, Consult reports and Test reports for all  details in brief  HPI  from the history and physical done on the day of admission on September 7th 2016 by DR Irine Seal  Patient is a pleasant 81 year old female from [place] Arabic speaking with history of atrial fibrillation, hepatitis C with cirrhosis, aortic stenosis, hypothyroidism who presented from the infectious disease office with nausea, jaundice, epigastric abdominal pain associated with Zepatier which was started 20 days prior to admission for treatment of her hepatitis C. 10 days prior to admission patient noticed she started to get jaundiced with some nausea and no vomiting. Patient with some associated chills and abdominal bloating. Patient denied any lower extremity edema, no fever, no chest pain, no shortness of breath, no diarrhea, no constipation, no dysuria, no melanoma, no hematemesis, no hematochezia. Lab work obtained showed a bilirubin level of 23.5 without phosphatase of 294 abdomen of 2.7 AST 159 ALT of 93 otherwise compressive metabolic profile was unremarkable. CBC was unremarkable.   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 - Seen by interventional radiology and gastroenterology and oncology service, , S/P Percutaneous Transhepatic cholangiogram, with external biliary drain placement and endoluminal biopsy by Dr. Earleen Newport on 04/09/15, Patient with new  external/internal biliary drain on the left 04/13/15. right external biliary drain could not cross the obstruction on the right side , the attempt will be made next week for reinsertion . - endoluminal biopsy on 04/09/2015 which was nondiagnostic on cytology. Cytology was resent during the right liver external drainage insertion on 04/13/15 which showed epithelial cells with atypia non-diagnostic of  malignancy. -Bili improving, mentation clear, no asterixis, PT/INR stable -Acute hepatitis panel negative except HCV Ab -CMV IgM and IgG elevated, but it is unlikely the cause of her illness , as AST/LAT would be significantly higher , ID and appropriately appreciated . - CT chest 9/15/716, showing no evidence of metastasis and thorax. -  patient was seen by oncology Dr.Guatam, giving no evidence of metastasis on CT chest or abdomen she will need surgical evaluation consider resection , patient was referred to hepatobiliary oncology surgery at [facility] , with overall patient is poor surgical candidate giving her liver cirrhosis, hepatitis C and severe debilitation , if patient is not surgical candidate , a shunt can be seen by IR regarding possible arteriogram and embolization planned versus chemotherapy , or palliative chemotherapy radiation treatment   Hypertension -stable, continue metoprolol.  Hypothyroidism Continue levothyroxine  Hyperlipidemia stopped statin due to liver failure.  Atrial fibrillation Rate controlled with metoprolol. Not on anticoagulation. She is followed by Dr. Dorris Carnes.  Aortic stenosis. Recent echo 4/16 shows severe stenosis of the aortic valve and mildly increased systolic pressure. No wall motion abnormalities. LV EF was 60-65%. FU with Cards   Discharge Condition:  Overall poor prognosis .    Follow UP  Follow-up Information    Follow up with [place].   Why:  Followup appointment is scheduled at the [facility] for PCP evaluation on Friday, April 22, 2015 at 3pm.  please call to cancel if you are unable to keep this appointment   Contact information:   [place] [address] [phone]      Follow up with Sullivan Lone, MD.   Specialties:  Hematology, Oncology   Why:  will be contacted with a follow-up appointment   Contact information:   [place]  [phone]         Discharge Instructions  and  Discharge Medications     Discharge Instructions    Diet - low sodium heart healthy    Complete by:  As directed      Discharge instructions    Complete by:  As directed   Follow with scheduled appointment on sickle cell clinic - IR follow-up for biliary drain change, will be contacted with appointment next week. - Follow-up with oncology, will be contacted with follow-up appointment . - Follow-up with [facility] hepatobiliary oncology surgery , patient will be contacted with appointment . -  Get CBC, CMP, 2 view Chest X ray checked  by Primary MD next visit.    Activity: As tolerated with Full fall precautions use walker/cane & assistance as needed   Disposition Home with home care   Diet: Heart  Healthy ** , with feeding assistance and aspiration precautions.  For Heart failure patients - Check your Weight same time everyday, if you gain over 2 pounds, or you develop in leg swelling, experience more shortness of breath or chest pain, call your Primary MD immediately. Follow Cardiac Low Salt Diet and 1.5 lit/day fluid restriction.   On your next visit with your primary care physician please Get Medicines reviewed and adjusted.   Please request your Prim.MD to go over all Hospital Tests and Procedure/Radiological results at the follow up, please get all Hospital records sent to your Prim MD by signing hospital release before you go home.   If you experience worsening of your admission symptoms, develop shortness of breath, life threatening emergency, suicidal or homicidal thoughts you must seek medical attention immediately by calling 911 or calling your MD immediately  if symptoms less severe.  You Must read complete instructions/literature along with all the possible adverse reactions/side effects for all the Medicines you take and that have been prescribed to you. Take any new Medicines after you have completely understood  and accpet all the possible adverse reactions/side effects.   Do not drive, operating heavy machinery, perform activities at heights, swimming or participation in water activities or provide baby sitting services if your were admitted for syncope or siezures until you have seen by Primary MD or a Neurologist and advised to do so again.  Do not drive when taking Pain medications.    Do not take more than prescribed Pain, Sleep and Anxiety Medications  Special Instructions: If you have smoked or chewed Tobacco  in the last 2 yrs please stop smoking, stop any regular Alcohol  and or any Recreational drug use.  Wear Seat belts while driving.   Please note  You were cared for by a hospitalist during your hospital stay. If you have any questions about your discharge medications or the care you received while you were in the hospital after you are discharged, you can call the unit and asked to speak with the hospitalist on call if the hospitalist that took care of you is not available. Once you are discharged, your primary care physician will handle any further medical issues. Please note that NO REFILLS for any discharge medications will be authorized once you are discharged, as it is imperative that you return to your primary care physician (or establish a relationship with a primary care physician if you do not have one) for your aftercare needs so that they can reassess your need for medications and monitor your lab values.     Increase activity slowly    Complete by:  As directed             Medication List    STOP taking these medications        pravastatin 20 MG tablet  Commonly known as:  PRAVACHOL      TAKE these medications        feeding supplement (ENSURE ENLIVE) Liqd  Take 237 mLs by mouth 2 (two) times daily between meals.     levothyroxine 88 MCG tablet  Commonly known as:  SYNTHROID, LEVOTHROID  Take 1 tablet (88 mcg total) by mouth daily before breakfast.      metoprolol tartrate 25 MG tablet  Commonly known as:  LOPRESSOR  Take 0.5 tablets (12.5 mg total) by mouth 2 (two) times daily.     ondansetron 4 MG tablet  Commonly known as:  ZOFRAN  Take 1 tablet (4  mg total) by mouth every 6 (six) hours as needed for nausea.     oxyCODONE 5 MG immediate release tablet  Commonly known as:  ROXICODONE  Take 1 tablet (5 mg total) by mouth every 6 (six) hours as needed for severe pain.          Diet and Activity recommendation: See Discharge Instructions above   Consults obtained - - Oncology  - infectious disease  - gastroenterology - Interventional radiology  Major procedures and Radiology Reports - PLEASE review detailed and final reports for all details, in brief -   - PTC and external drain placed 9/10 in IR - 9/13 additional cholangiogram with drain exchange but unable to cross obstruction via right tube ,  New left tube = Internal- external   Endoluminal biopsy performed of the intrahepatic biliary system at site of obstruction 04/09/2015 - NON-DIAGNOSTIC TISSUE,  LIVER, FINE NEEDLE ASPIRATION BILIARY DRAIN, - MOST OF THE SPECIMEN CONSISTS OF BLOOD AND INFLAMMATORY CELLS. THERE ARE A FEW SMALL GROUPS OF TIGHTLY COHESIVE EPITHELIAL CELLS WITH MILD ATYPIA. THE ATYPICAL CELLS ARE NOT QUALITATIVELY DIAGNOSTIC OF MALIGNANCY .   Ct Abdomen Pelvis W Wo Contrast  04/08/2015   CLINICAL DATA:  81 year old female with hepatitis-C, jaundice, hypothyroidism and abdominal distention. Possible portal vein thrombosis on recent sonogram.  EXAM: CT ABDOMEN AND PELVIS WITHOUT AND WITH CONTRAST  TECHNIQUE: Multidetector CT imaging of the abdomen and pelvis was performed following the standard protocol before and following the bolus administration of intravenous contrast.  CONTRAST:  135m OMNIPAQUE IOHEXOL 300 MG/ML  SOLN  COMPARISON:  04/07/2015 abdominal sonogram.  FINDINGS: Lower chest: Cardiomegaly. There is atherosclerosis of the thoracic aorta, the  great vessels of the mediastinum and the coronary arteries, including calcified atherosclerotic plaque in the left anterior descending, left circumflex and right coronary arteries. Aortic valvular and mitral annular calcifications are present. Subsegmental dependent atelectasis at both lung bases.  Hepatobiliary: The liver surface is diffusely irregular and there is asymmetric hypertrophy of the lateral left liver lobe, indicating cirrhosis. There is an ill-defined 2.9 x 2.2 cm liver mass within the central segment 8 right liver lobe (series 501/image 49), which demonstrates progressive heterogeneous enhancement. There is marked diffuse intrahepatic biliary ductal dilatation. There is an enhancing tumor thrombus at the main portal vein bifurcation extending into the proximal left and right portal vein branches. The gallbladder is contracted. The common bile duct is normal caliber (4 mm diameter). There are no additional liver masses.  Pancreas: No pancreatic mass. Ill-defined peripancreatic fluid throughout the length of the pancreas. No focal peripancreatic fluid collection. No main pancreatic duct dilation.  Spleen: Top normal size spleen (12.2 cm craniocaudal splenic length). No splenic mass.  Adrenals/Urinary Tract: Normal adrenals. Simple 1.0 cm posterior interpolar left renal cyst. Simple 1.1 cm exophytic lower left renal cyst. Additional subcentimeter hypodense left renal lesions, too small to characterize. No hydronephrosis. No nephrolithiasis. Visualized ureters are normal caliber. On delayed imaging, there is no urothelial wall thickening and there are no filling defects in the opacified portions of the bilateral collecting systems or visualized proximal ureters.  Stomach/Bowel: Relatively collapsed and grossly normal stomach normal caliber small bowel, with no appreciable small bowel wall thickening normal appendix. Normal large bowel, with no large bowel wall thickening.  Vascular/Lymphatic:  Atherosclerotic nonaneurysmal abdominal aorta. Patent splenic, hepatic and renal veins. Enlarged 1.4 cm short axis gastrohepatic ligament lymph node (series 501/ image 65). Mild porta hepatis lymphadenopathy.  Reproductive: Grossly normal uterus. Simple 2.3 cm left  adnexal cystic structure (series 501/image 152).  Other: Small volume ascites.  No pneumoperitoneum.  Musculoskeletal: Partially visualized right total hip arthroplasty. Mild T10 and L1 vertebral body compression fractures and severe L2 vertebral body compression fracture of indeterminate chronicity. Mild to moderate degenerative changes in the visualized thoracolumbar spine. No aggressive appearing focal osseous lesions.  IMPRESSION: 1. 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 A cholangiocarcinoma (Klatskin tumor) is favored given the location and biliary ductal dilatation, however hepatocellular carcinoma is on the differential given the underlying cirrhosis and the presence of portal vein tumor thrombus. 2. Small volume ascites.  Top-normal size spleen. 3. Mild porta hepatis and gastrohepatic ligament lymphadenopathy, nonspecific. 4. Mild T10 and L1 and superior L2 vertebral body compression fractures of indeterminate chronicity, likely chronic. 5. Cardiomegaly. Atherosclerosis, including three-vessel coronary artery disease. Please note that although the presence of coronary artery calcium documents the presence of coronary artery disease, the severity of this disease and any potential stenosis cannot be assessed on this non-gated CT examination. 6. Simple benign-appearing 2.3 cm left adnexal cyst. No follow-up is required. This recommendation follows ACR consensus guidelines: White Paper of the ACR Incidental Findings Committee II on Adnexal Findings. J  Am Coll Radiol [id number]. These results were called by telephone at the time of interpretation on 04/08/2015 at 8:55 am to Dr. Nicoletta Ba , who verbally acknowledged these results.   Electronically Signed   By: Ilona Sorrel M.D.   On: 04/08/2015 09:06   Ct Chest W Contrast  04/15/2015   CLINICAL DATA:  81 year old with current history of hepatitis-C and cirrhosis, presenting this admission with a central liver mass causing biliary obstruction, hepatocellular carcinoma versus cholangiocarcinoma. Staging CT to evaluate for metastatic disease.  EXAM: CT CHEST WITH CONTRAST  TECHNIQUE: Multidetector CT imaging of the chest was performed during intravenous contrast administration.  CONTRAST:  60 mL OMNIPAQUE IOHEXOL 300 MG/ML IV.  COMPARISON:  None.  FINDINGS: Lungs: No evidence of pulmonary metastasis. Atelectasis in the lower lobes. No confluent airspace consolidation. No evidence of interstitial lung disease.  Trachea/bronchi: Central airways patent without significant bronchial wall thickening. Calcification of the tracheobronchial cartilages.  Pleura:  No pleural plaques or masses.  No pleural effusions.  Mediastinum:  No mediastinal masses.  Heart and Vascular: Heart markedly enlarged. Moderate to marked left ventricular hypertrophy. Extensive mitral annular calcification. Extensive aortic valvular calcification. Moderate LAD coronary atherosclerosis. No pericardial effusion. Moderate to marked atherosclerosis involving the thoracic aorta without aneurysm or dissection.  Lymphatic: No pathologic mediastinal, hilar or axillary lymphadenopathy.  Other findings: None.  Visualized lower neck: Thyroid gland atrophic. No evidence of supraclavicular lymphadenopathy.  Visualized upper abdomen: Biliary drainage catheters in place in the liver with residual moderate to marked biliary ductal dilation in the posterior segment right lobe. Left lobe bile ducts decompressed. Central hepatic mass as detailed on the prior  CT abdomen and pelvis examination 1 week ago.  Musculoskeletal: Osseous demineralization with a mild compression fracture of the upper endplate of Y85 which does not appear acute and is felt to be due to osteoporosis. No evidence of osseous metastatic disease.  IMPRESSION: 1. No evidence of metastatic disease in the thorax. 2. Marked cardiomegaly with moderate to marked left ventricular hypertrophy, extensive mitral annular calcification and extensive aortic valvular calcification. 3. Biliary drainage catheters in place in the visualized liver  with residual biliary ductal dilation in the posterior segment of the right lobe. The left lobe bile ducts are decompressed. 4. Central hepatic mass as detailed on the prior CT abdomen pelvis examination 1 week ago.   Electronically Signed   By: Evangeline Dakin M.D.   On: 04/15/2015 07:55   US Abdomen Complete  04/06/2015   CLINICAL DATA:  Acute liver failure.  Jaundice.  EXAM: ULTRASOUND ABDOMEN COMPLETE  COMPARISON:  None.  FINDINGS: Gallbladder: Gallbladder is not visualized and may be surgically absent or contracted.  Common bile duct: Diameter: 5.3 mm, normal  Liver: Coarsened parenchymal echotexture with nodular contour suggesting hepatic cirrhosis. Prominence of portal veins with flow in the appropriate direction. Suggestion of mild intrahepatic bile duct dilatation. No focal liver lesions identified.  IVC: No abnormality visualized.  Pancreas: Visualized portion unremarkable.  Spleen: Spleen appears enlarged with homogeneous parenchymal echotexture.  Right Kidney: Length: 10.3 cm. Echogenicity within normal limits. No mass or hydronephrosis visualized.  Left Kidney: Length: 9.6 cm. Small cyst demonstrated in the lower pole measuring up to about 1 cm diameter. No hydronephrosis.  Abdominal aorta: No aneurysm visualized.  Other findings: None.  IMPRESSION: Coarsened liver echotexture with nodular contour and prominent pulmonary venous structures consistent with  hepatic cirrhosis. No focal lesions identified. Suggestion of intrahepatic bile duct dilatation without extrahepatic ductal dilatation. Gallbladder not visualized. Probable splenic enlargement.   Electronically Signed   By: Lucienne Capers M.D.   On: 04/06/2015 23:51   [place] Art/ven Flow Abd Pelv Doppler  04/07/2015   CLINICAL DATA:  Jaundice, hepatic cirrhosis.  EXAM: DUPLEX ULTRASOUND OF LIVER  TECHNIQUE: Color and duplex Doppler ultrasound was performed to evaluate the hepatic in-flow and out-flow vessels. Exam is significantly limited as patient does not speak English and unable to follow breathing instructions.  COMPARISON:  None.  FINDINGS: Portal Vein Velocities  Main: 18.1 cm/sec proximally ; hepatopetal flow is noted proximally and in the midportion, but no flow is noted distally consistent with portal venous thrombus.  Right:  Not visualized due to limitations described above.  Left:  Not visualized due to limitations described above.  Hepatic Vein Velocities  Right:  20.5 cm/sec ; hepatofugal flow is noted.  Middle:  Not visualized due to limitations described above.  Left:  29.7 Cm/sec ; hepatofugal flow is noted.  Hepatic Artery Velocity:  146 cm/sec  Splenic Vein Velocity:  17.1 cm/sec  Varices: Present.  Ascites: Present.  IMPRESSION: Exam is significantly limited as patient does not speak English and unable to follow breathing instructions. There is probable occlusive thrombus seen in the distal portal vein. Middle hepatic vein is not visualized due to previously described limitations, but right and left hepatic veins demonstrate normal flow and velocity. These results will be called to the ordering clinician or representative by the Radiologist Assistant, and communication documented in the PACS or zVision Dashboard.   Electronically Signed   By: Marijo Conception, M.D.   On: 04/07/2015 08:12   Ir Biliary Drain Placement With Cholangiogram  04/14/2015   CLINICAL DATA:  Biliary obstruction.   Klatskin tumor.  EXAM: IR CONVERT BILIARY DRAIN TO INT-EXT BILARY DRAIN; IR BILIARY DRAIN PLACEMENT W/ CHOLANGIOGRAM  FLUOROSCOPY TIME:  22 minutes and 54 seconds  MEDICATIONS AND MEDICAL HISTORY: Versed 2 mg, Fentanyl 100 mcg.  Additional Medications: 3.75 g of Zosyn.  ANESTHESIA/SEDATION: Moderate sedation time: 55 minutes  CONTRAST:  40 cc Omnipaque 300  PROCEDURE: The procedure, risks, benefits, and alternatives were explained to  the patient. Questions regarding the procedure were encouraged and answered. The patient understands and consents to the procedure.  The abdomen and right flank were prepped with Betadine in a sterile fashion, and a sterile drape was applied covering the operative field. A sterile gown and sterile gloves were used for the procedure.  The right biliary drain was exchanged over a Bentson wire for an 8 [place] sheath. Sheath cholangiogram was performed. A Kumpe the catheter was advanced over the Bentson wire to the central biliary tree. Exhaustive attempts were made to advance the catheter across the obstruction into the common bile duct. Wires utilized included roadrunner an glide wires. Catheters utilized included a copy catheter, cobra catheter, and Sos Omni catheter.  Under sonographic guidance, a 21 gauge needle was inserted into a peripheral left duct. This was removed over a 018 wire which was up sized to a 3 J. a Kumpe the catheter was easily advanced over the J-wire, across the obstruction and into the common bile duct. This was advanced over a glidewire into the duodenum. It was exchanged over an Amplatz wire for a 10 [place] biliary drain. Several additional sideholes were cut above the marker. The right biliary sheath was exchanged for a [place] strain. This was advanced in the central biliary tree. Contrast was injected into both catheters. A sample was sent for cytology.  FINDINGS: Sheath cholangiography confirms central biliary obstruction.  Subsequent images  demonstrate placement of a left internal external biliary drain with its tip in the duodenum. The right external biliary drain is coiled in the central biliary tree.  COMPLICATIONS: None  IMPRESSION: Successful left internal external biliary drain with passage across the obstruction. The right biliary drain could not be passed across the obstruction secondary to suboptimal geometry. The patient will return next week for an additional right biliary drain.   Electronically Signed   By: Marybelle Killings M.D.   On: 04/14/2015 18:15   Ir Biliary Drain Placement With Cholangiogram  04/09/2015   CLINICAL DATA:  81 year old female presents with jaundice, hyperbilirubinemia, and imaging diagnosis of hilar tumor in the liver. CT demonstrates intrahepatic biliary ductal dilatation.  She has been referred for percutaneous transhepatic cholangiogram and drainage.  EXAM: IR BILIARY DRAIN PLACEMENT W/ CHOLANGIOGRAM; IR ENDOLUMNICAL BIOPSY OF BILIARY TREE  COMPARISON:  CT 04/07/2015  ANESTHESIA/SEDATION: 4.0 IV Versed; 2 high mcg IV Fentanyl.  Total Moderate Sedation Time: 45 minutes.  CONTRAST:  39m OMNIPAQUE IOHEXOL 300 MG/ML  SOLN  MEDICATIONS: Receiving Zosyn  Procedural  FLUOROSCOPY TIME:  18 minutes 36 second  TECHNIQUE: The procedure, risks, benefits, and alternatives were explained to the patient and the patient's family. A complete informed consent was performed, with risk benefit analysis. Specific risks that were discussed for the procedure include bleeding, infection, biliary sepsis, IC use day, organ injury, need for further procedure, need for further surgery, long-term drain placement, cardiopulmonary collapse, death. Questions regarding the procedure were encouraged and answered. The patient understands and consents to the procedure.  Patient is position in supine position on the fluoroscopy table, and the upper abdomen was prepped and draped in the usual sterile fashion. Maximum barrier sterile technique with  sterile gowns and gloves were used for the procedure. A timeout was performed prior to the initiation of the procedure. Local anesthesia was provided with 1% lidocaine with epinephrine.  Ultrasound survey of the right liver lobe was performed. Window into the right biliary system was present for the right liver approach.  1% lidocaine was  used for local anesthesia, with generous infiltration of the skin and subcutaneous tissues in and inter left costal location. A Chiba needle was advanced under ultrasound guidance into the right liver lobe.  Once the tip of the needle was confirmed within the biliary system by injecting small aliquots of contrast, images were stored of the biliary system after partially opacifying the biliary tree via the needle.  Once the tip of this needle was confirmed within the biliary system, an 018 wire was advanced centrally. The needle was removed, a small incision was made with an 11 blade scalpel.  An Accustick system would not advance into the biliary system, and [place] a micropuncture system was advanced over the 018 wire into the biliary tree. The inner dilator and wire were removed, and an 035 catheter was advanced into the biliary system. A standard 6 French sheath was then placed in the biliary system.  Cholangiogram was then performed.  A combination of Glidewire, 4 [place] glide cath, 5 [place] cobra catheter, as well as a 6 [place] beacon tip 35 cm sheath used to attempt to navigate through the obstruction with no success.  A standard pigtail 10 French drainage catheter was placed over the [employer].  We confirmed placement with contrast infusion.  The patient tolerated the procedure well and remained hemodynamically stable throughout.  No complications were encountered and no significant blood loss was encountered.  COMPLICATIONS: None  FINDINGS: Ultrasound survey demonstrates dilated intrahepatic biliary ducts. Approach into the right liver lobe was identified.  Percutaneous  transhepatic cholangiogram demonstrates moderate to severely dilated intrahepatic biliary ducts. There is communication of the left and right-sided ducts, and the os a right and left drain may not be required for drainage.  Dilated ducts of the segment 4B should not be confused for common hepatic duct. Common hepatic duct appears to be closer to the patient's right side.  Small amount of contrast did opacified I the common hepatic duct and traversed the common bile duct into the duodenum. Cystic duct opacified with incomplete filling of the gallbladder.  Unable to traverse the obstruction from our initial puncture.  IMPRESSION: Status post percutaneous transhepatic cholangiogram with demonstration of high-grade stenosis of the common hepatic duct, and subsequent placement of externalized standard 10 French pigtail catheter drain.  Communication of left and right-sided ducts was demonstrated.  Signed,  Dulcy Fanny. Earleen Newport, DO  [facility]  [facility]  PLAN: Would recommend repeat through the tube cholangiogram on Monday or Tuesday of the upcoming week for drainage catheter exchange and a second attempt at traversing the obstruction.  Twice a day 10 cc saline flushes until drain exchange.   Electronically Signed   By: Corrie Mckusick D.O.   On: 04/09/2015 13:51   Ir Convert Biliary Drain To Int Ext Biliary Drain  04/14/2015   CLINICAL DATA:  Biliary obstruction.  [place] tumor.  EXAM: IR CONVERT BILIARY DRAIN TO INT-EXT BILARY DRAIN; IR BILIARY DRAIN PLACEMENT W/ CHOLANGIOGRAM  FLUOROSCOPY TIME:  22 minutes and 54 seconds  MEDICATIONS AND MEDICAL HISTORY: Versed 2 mg, Fentanyl 100 mcg.  Additional Medications: 3.75 g of Zosyn.  ANESTHESIA/SEDATION: Moderate sedation time: 55 minutes  CONTRAST:  40 cc Omnipaque 300  PROCEDURE: The procedure, risks, benefits, and alternatives were explained to the patient. Questions regarding the procedure were encouraged and answered. The  patient understands and consents to the procedure.  The abdomen and right flank were prepped with Betadine in a sterile fashion, and a sterile drape  was applied covering the operative field. A sterile gown and sterile gloves were used for the procedure.  The right biliary drain was exchanged over a Bentson wire for an 8 [place] sheath. Sheath cholangiogram was performed. A Kumpe the catheter was advanced over the Bentson wire to the central biliary tree. Exhaustive attempts were made to advance the catheter across the obstruction into the common bile duct. Wires utilized included roadrunner an glide wires. Catheters utilized included a copy catheter, cobra catheter, and Sos Omni catheter.  Under sonographic guidance, a 21 gauge needle was inserted into a peripheral left duct. This was removed over a 018 wire which was up sized to a 3 J. a Kumpe the catheter was easily advanced over the J-wire, across the obstruction and into the common bile duct. This was advanced over a glidewire into the duodenum. It was exchanged over an Amplatz wire for a 10 [place] biliary drain. Several additional sideholes were cut above the marker. The right biliary sheath was exchanged for a [place] strain. This was advanced in the central biliary tree. Contrast was injected into both catheters. A sample was sent for cytology.  FINDINGS: Sheath cholangiography confirms central biliary obstruction.  Subsequent images demonstrate placement of a left internal external biliary drain with its tip in the duodenum. The right external biliary drain is coiled in the central biliary tree.  COMPLICATIONS: None  IMPRESSION: Successful left internal external biliary drain with passage across the obstruction. The right biliary drain could not be passed across the obstruction secondary to suboptimal geometry. The patient will return next week for an additional right biliary drain.   Electronically Signed   By: Marybelle Killings M.D.   On:  04/14/2015 18:15   Ir Endoluminal Bx Of Biliary Tree  04/09/2015   CLINICAL DATA:  81 year old female presents with jaundice, hyperbilirubinemia, and imaging diagnosis of hilar tumor in the liver. CT demonstrates intrahepatic biliary ductal dilatation.  She has been referred for percutaneous transhepatic cholangiogram and drainage.  EXAM: IR BILIARY DRAIN PLACEMENT W/ CHOLANGIOGRAM; IR ENDOLUMNICAL BIOPSY OF BILIARY TREE  COMPARISON:  CT 04/07/2015  ANESTHESIA/SEDATION: 4.0 IV Versed; 2 high mcg IV Fentanyl.  Total Moderate Sedation Time: 45 minutes.  CONTRAST:  44m OMNIPAQUE IOHEXOL 300 MG/ML  SOLN  MEDICATIONS: Receiving Zosyn  Procedural  FLUOROSCOPY TIME:  18 minutes 36 second  TECHNIQUE: The procedure, risks, benefits, and alternatives were explained to the patient and the patient's family. A complete informed consent was performed, with risk benefit analysis. Specific risks that were discussed for the procedure include bleeding, infection, biliary sepsis, IC use day, organ injury, need for further procedure, need for further surgery, long-term drain placement, cardiopulmonary collapse, death. Questions regarding the procedure were encouraged and answered. The patient understands and consents to the procedure.  Patient is position in supine position on the fluoroscopy table, and the upper abdomen was prepped and draped in the usual sterile fashion. Maximum barrier sterile technique with sterile gowns and gloves were used for the procedure. A timeout was performed prior to the initiation of the procedure. Local anesthesia was provided with 1% lidocaine with epinephrine.  Ultrasound survey of the right liver lobe was performed. Window into the right biliary system was present for the right liver approach.  1% lidocaine was used for local anesthesia, with generous infiltration of the skin and subcutaneous tissues in and inter left costal location. A Chiba needle was advanced under ultrasound guidance into the  right liver lobe.  Once the tip  of the needle was confirmed within the biliary system by injecting small aliquots of contrast, images were stored of the biliary system after partially opacifying the biliary tree via the needle.  Once the tip of this needle was confirmed within the biliary system, an 018 wire was advanced centrally. The needle was removed, a small incision was made with an 11 blade scalpel.  An Accustick system would not advance into the biliary system, and [place] a micropuncture system was advanced over the 018 wire into the biliary tree. The inner dilator and wire were removed, and an 035 catheter was advanced into the biliary system. A standard 6 French sheath was then placed in the biliary system.  Cholangiogram was then performed.  A combination of Glidewire, 4 [place] glide cath, 5 [place] cobra catheter, as well as a 6 [place] beacon tip 35 cm sheath used to attempt to navigate through the obstruction with no success.  A standard pigtail 10 French drainage catheter was placed over the [employer].  We confirmed placement with contrast infusion.  The patient tolerated the procedure well and remained hemodynamically stable throughout.  No complications were encountered and no significant blood loss was encountered.  COMPLICATIONS: None  FINDINGS: Ultrasound survey demonstrates dilated intrahepatic biliary ducts. Approach into the right liver lobe was identified.  Percutaneous transhepatic cholangiogram demonstrates moderate to severely dilated intrahepatic biliary ducts. There is communication of the left and right-sided ducts, and the os a right and left drain may not be required for drainage.  Dilated ducts of the segment 4B should not be confused for common hepatic duct. Common hepatic duct appears to be closer to the patient's right side.  Small amount of contrast did opacified I the common hepatic duct and traversed the common bile duct into the duodenum. Cystic duct opacified with incomplete  filling of the gallbladder.  Unable to traverse the obstruction from our initial puncture.  IMPRESSION: Status post percutaneous transhepatic cholangiogram with demonstration of high-grade stenosis of the common hepatic duct, and subsequent placement of externalized standard 10 French pigtail catheter drain.  Communication of left and right-sided ducts was demonstrated.  Signed,  Dulcy Fanny. Earleen Newport, DO  [facility]  [facility]  PLAN: Would recommend repeat through the tube cholangiogram on Monday or Tuesday of the upcoming week for drainage catheter exchange and a second attempt at traversing the obstruction.  Twice a day 10 cc saline flushes until drain exchange.   Electronically Signed   By: Corrie Mckusick D.O.   On: 04/09/2015 13:51    Micro Results      No results found for this or any previous visit (from the past 240 hour(s)).     Today   Subjective:   Diana Pearson today has no headache,no chest  pain, complaints of mild abdominal discomfort ,  Objective:   Blood pressure 106/52, pulse 59, temperature 98.1 F (36.7 C), temperature source Oral, resp. rate 18, height 5' 0.63" (1.54 m), weight 51 kg (112 lb 7 oz), SpO2 99 %.   Intake/Output Summary (Last 24 hours) at 04/15/15 1755 Last data filed at 04/15/15 1644  Gross per 24 hour  Intake    837 ml  Output    605 ml  Net    232 ml    Exam  General: AAOx3, profoundly icteric  Cardiovascular: K4Q2/MMN, systolic murmur  Respiratory: CTAB, no use of accessory muscle  Abdomen: soft, mild R sided tenderness, nondistended , BS present, biliary drain 2  Musculoskeletal:  no edema c/c Data Review   CBC w Diff: Lab Results  Component Value Date   WBC 9.0 04/14/2015   HGB 13.9 04/14/2015   HCT 40.8 04/14/2015   PLT 208 04/14/2015   LYMPHOPCT 18 04/06/2015   MONOPCT 12 04/06/2015   EOSPCT 2 04/06/2015   BASOPCT 0 04/06/2015    CMP: Lab Results  Component Value Date   NA  134* 04/15/2015   K 4.4 04/15/2015   CL 104 04/15/2015   CO2 22 04/15/2015   BUN 17 04/15/2015   CREATININE 0.80 04/15/2015   PROT 5.9* 04/15/2015   ALBUMIN 1.8* 04/15/2015   BILITOT 14.1* 04/15/2015   ALKPHOS 143* 04/15/2015   AST 72* 04/15/2015   ALT 35 04/15/2015  .   Total Time in preparing paper work, data evaluation and todays exam - 35 minutes  ELGERGAWY, DAWOOD M.D on 04/15/2015 at 5:55 PM  [facility]   Office  [phone]

## 2015-04-15 NOTE — Progress Notes (Signed)
Referring Physician(s): Dr. Havery Moros  Chief Complaint:  Klatskins's tumor Obstructive jaundice Liver mass Hep C PTC and external drain placed 9/10 in IR 9/13 additional cholangiogram with drain exchange but unable to cross obstruction via right tube / New left tube = Internal- external   Subjective:  Patient is resting. Daughter at bedside and she reports right tube is still leaking.  Ostomy bag now in place, controlling leaking better. I explained that it is leaking due to the obstruction and attempted will be made again next week to cross it.  Allergies: Review of patient's allergies indicates no known allergies.  Medications: Prior to Admission medications   Medication Sig Start Date End Date Taking? Authorizing Provider  levothyroxine (SYNTHROID, LEVOTHROID) 88 MCG tablet Take 88 mcg by mouth daily before breakfast.   Yes Historical Provider, MD  metoprolol tartrate (LOPRESSOR) 25 MG tablet Take 0.5 tablets (12.5 mg total) by mouth 2 (two) times daily. 10/11/14  Yes Fay Records, MD  pravastatin (PRAVACHOL) 20 MG tablet Take 1 tablet (20 mg total) by mouth daily. 10/11/14  Yes Fay Records, MD  ondansetron (ZOFRAN) 4 MG tablet Take 1 tablet (4 mg total) by mouth every 8 (eight) hours as needed for nausea or vomiting. Patient not taking: Reported on 04/06/2015 03/24/15   Thayer Headings, MD     Vital Signs: BP 107/64 mmHg  Pulse 56  Temp(Src) 98.7 F (37.1 C) (Oral)  Resp 18  Ht 5' 0.63" (1.54 m)  Wt 112 lb 7 oz (51 kg)  BMI 21.50 kg/m2  SpO2 97%  Physical Exam   Awakens easily Minimal abdominal pain to palpation Right tube intact, leaking bilious fluid from skin site. Ostomy bag over site. Blood tinged yellow bile in bag Left tube intact, site clean, dry, output bilious with some debris/sludge ~470 mls output total recorded.   Imaging: Ct Chest W Contrast  04/15/2015   CLINICAL DATA:  81 year old with current history of hepatitis-C and cirrhosis, presenting  this admission with a central liver mass causing biliary obstruction, hepatocellular carcinoma versus cholangiocarcinoma. Staging CT to evaluate for metastatic disease.  EXAM: CT CHEST WITH CONTRAST  TECHNIQUE: Multidetector CT imaging of the chest was performed during intravenous contrast administration.  CONTRAST:  60 mL OMNIPAQUE IOHEXOL 300 MG/ML IV.  COMPARISON:  None.  FINDINGS: Lungs: No evidence of pulmonary metastasis. Atelectasis in the lower lobes. No confluent airspace consolidation. No evidence of interstitial lung disease.  Trachea/bronchi: Central airways patent without significant bronchial wall thickening. Calcification of the tracheobronchial cartilages.  Pleura:  No pleural plaques or masses.  No pleural effusions.  Mediastinum:  No mediastinal masses.  Heart and Vascular: Heart markedly enlarged. Moderate to marked left ventricular hypertrophy. Extensive mitral annular calcification. Extensive aortic valvular calcification. Moderate LAD coronary atherosclerosis. No pericardial effusion. Moderate to marked atherosclerosis involving the thoracic aorta without aneurysm or dissection.  Lymphatic: No pathologic mediastinal, hilar or axillary lymphadenopathy.  Other findings: None.  Visualized lower neck: Thyroid gland atrophic. No evidence of supraclavicular lymphadenopathy.  Visualized upper abdomen: Biliary drainage catheters in place in the liver with residual moderate to marked biliary ductal dilation in the posterior segment right lobe. Left lobe bile ducts decompressed. Central hepatic mass as detailed on the prior CT abdomen and pelvis examination 1 week ago.  Musculoskeletal: Osseous demineralization with a mild compression fracture of the upper endplate of R51 which does not appear acute and is felt to be due to osteoporosis. No evidence of osseous metastatic disease.  IMPRESSION: 1. No evidence of metastatic disease in the thorax. 2. Marked cardiomegaly with moderate to marked left  ventricular hypertrophy, extensive mitral annular calcification and extensive aortic valvular calcification. 3. Biliary drainage catheters in place in the visualized liver with residual biliary ductal dilation in the posterior segment of the right lobe. The left lobe bile ducts are decompressed. 4. Central hepatic mass as detailed on the prior CT abdomen pelvis examination 1 week ago.   Electronically Signed   By: Evangeline Dakin M.D.   On: 04/15/2015 07:55   Ir Biliary Drain Placement With Cholangiogram  04/14/2015   CLINICAL DATA:  Biliary obstruction.  Klatskin tumor.  EXAM: IR CONVERT BILIARY DRAIN TO INT-EXT BILARY DRAIN; IR BILIARY DRAIN PLACEMENT W/ CHOLANGIOGRAM  FLUOROSCOPY TIME:  22 minutes and 54 seconds  MEDICATIONS AND MEDICAL HISTORY: Versed 2 mg, Fentanyl 100 mcg.  Additional Medications: 3.75 g of Zosyn.  ANESTHESIA/SEDATION: Moderate sedation time: 55 minutes  CONTRAST:  40 cc Omnipaque 300  PROCEDURE: The procedure, risks, benefits, and alternatives were explained to the patient. Questions regarding the procedure were encouraged and answered. The patient understands and consents to the procedure.  The abdomen and right flank were prepped with Betadine in a sterile fashion, and a sterile drape was applied covering the operative field. A sterile gown and sterile gloves were used for the procedure.  The right biliary drain was exchanged over a Bentson wire for an 8 [place] sheath. Sheath cholangiogram was performed. A Kumpe the catheter was advanced over the Bentson wire to the central biliary tree. Exhaustive attempts were made to advance the catheter across the obstruction into the common bile duct. Wires utilized included roadrunner an glide wires. Catheters utilized included a copy catheter, cobra catheter, and Sos Omni catheter.  Under sonographic guidance, a 21 gauge needle was inserted into a peripheral left duct. This was removed over a 018 wire which was up sized to a 3 J. a Kumpe the  catheter was easily advanced over the J-wire, across the obstruction and into the common bile duct. This was advanced over a glidewire into the duodenum. It was exchanged over an Amplatz wire for a 10 [place] biliary drain. Several additional sideholes were cut above the marker. The right biliary sheath was exchanged for a [place] strain. This was advanced in the central biliary tree. Contrast was injected into both catheters. A sample was sent for cytology.  FINDINGS: Sheath cholangiography confirms central biliary obstruction.  Subsequent images demonstrate placement of a left internal external biliary drain with its tip in the duodenum. The right external biliary drain is coiled in the central biliary tree.  COMPLICATIONS: None  IMPRESSION: Successful left internal external biliary drain with passage across the obstruction. The right biliary drain could not be passed across the obstruction secondary to suboptimal geometry. The patient will return next week for an additional right biliary drain.   Electronically Signed   By: Marybelle Killings M.D.   On: 04/14/2015 18:15   Ir Convert Biliary Drain To Int Ext Biliary Drain  04/14/2015   CLINICAL DATA:  Biliary obstruction.  Klatskin tumor.  EXAM: IR CONVERT BILIARY DRAIN TO INT-EXT BILARY DRAIN; IR BILIARY DRAIN PLACEMENT W/ CHOLANGIOGRAM  FLUOROSCOPY TIME:  22 minutes and 54 seconds  MEDICATIONS AND MEDICAL HISTORY: Versed 2 mg, Fentanyl 100 mcg.  Additional Medications: 3.75 g of Zosyn.  ANESTHESIA/SEDATION: Moderate sedation time: 55 minutes  CONTRAST:  40 cc Omnipaque 300  PROCEDURE: The procedure, risks, benefits, and alternatives  were explained to the patient. Questions regarding the procedure were encouraged and answered. The patient understands and consents to the procedure.  The abdomen and right flank were prepped with Betadine in a sterile fashion, and a sterile drape was applied covering the operative field. A sterile gown and sterile gloves  were used for the procedure.  The right biliary drain was exchanged over a Bentson wire for an 8 [place] sheath. Sheath cholangiogram was performed. A Kumpe the catheter was advanced over the Bentson wire to the central biliary tree. Exhaustive attempts were made to advance the catheter across the obstruction into the common bile duct. Wires utilized included roadrunner an glide wires. Catheters utilized included a copy catheter, cobra catheter, and Sos Omni catheter.  Under sonographic guidance, a 21 gauge needle was inserted into a peripheral left duct. This was removed over a 018 wire which was up sized to a 3 J. a Kumpe the catheter was easily advanced over the J-wire, across the obstruction and into the common bile duct. This was advanced over a glidewire into the duodenum. It was exchanged over an Amplatz wire for a 10 [place] biliary drain. Several additional sideholes were cut above the marker. The right biliary sheath was exchanged for a [place] strain. This was advanced in the central biliary tree. Contrast was injected into both catheters. A sample was sent for cytology.  FINDINGS: Sheath cholangiography confirms central biliary obstruction.  Subsequent images demonstrate placement of a left internal external biliary drain with its tip in the duodenum. The right external biliary drain is coiled in the central biliary tree.  COMPLICATIONS: None  IMPRESSION: Successful left internal external biliary drain with passage across the obstruction. The right biliary drain could not be passed across the obstruction secondary to suboptimal geometry. The patient will return next week for an additional right biliary drain.   Electronically Signed   By: Marybelle Killings M.D.   On: 04/14/2015 18:15    Labs:  CBC:  Recent Labs  04/11/15 0450 04/12/15 0546 04/13/15 0420 04/14/15 0519  WBC 6.9 7.0 7.3 9.0  HGB 12.9 12.7 13.0 13.9  HCT 37.1 36.0 37.0 40.8  PLT 172 163 181 208    COAGS:  Recent  Labs  04/06/15 1405 04/06/15 2034 04/07/15 0523 04/08/15 0720 04/08/15 1316 04/12/15 0546  INR 1.44 1.42 1.47 1.59*  --  1.46  APTT 30  --   --   --  34 33    BMP:  Recent Labs  04/12/15 0546 04/13/15 0420 04/14/15 0519 04/15/15 0530  NA 135 135 135 134*  K 4.2 4.3 5.0 4.4  CL 103 102 102 104  CO2 '25 25 23 22  ' GLUCOSE 89 82 94 93  BUN '11 13 18 17  ' CALCIUM 7.9* 7.9* 8.0* 7.4*  CREATININE 0.73 0.79 1.15* 0.80  GFRNONAA >60 >60 43* >60  GFRAA >60 >60 50* >60    LIVER FUNCTION TESTS:  Recent Labs  04/12/15 0546 04/13/15 0420 04/14/15 0519 04/15/15 0530  BILITOT 14.1* 13.8* 15.8* 14.1*  AST 67* 65* 80* 72*  ALT 48 39 41 35  ALKPHOS 179* 158* 175* 143*  PROT 6.1* 6.0* 6.8 5.9*  ALBUMIN 1.9* 1.9* 2.2* 1.8*    Assessment and Plan:  Klatskin's tumor with obstructive jaundice S/p (R)external biliary drain and (L)Int/Ext bilary drain. Bili sl up today to 15.8 (R)drain leaking, likely due to obstruction, can try to keep clean dressing around it, or may need to get WOCN consult to  see if they can configure a dressing that will allow drainage into a bag. Monitor T. Bili, plan for reattempt to cross obstruction from (R) next week.   Signed: Murrell Redden PA-C 04/15/2015, 11:19 AM   I spent a total of 15 Minutes at the the patient's bedside AND on the patient's hospital floor or unit, greater than 50% of which was counseling/coordinating care for f/u after R and L biliary drains.

## 2015-04-15 NOTE — Progress Notes (Signed)
Initial Nutrition Assessment  DOCUMENTATION CODES:   Not applicable  INTERVENTION:   Provide Ensure Enlive po BID, each supplement provides 350 kcal and 20 grams of protein.  Encourage adequate PO intake.   NUTRITION DIAGNOSIS:   Increased nutrient needs related to acute illness as evidenced by estimated needs.  GOAL:   Patient will meet greater than or equal to 90% of their needs  MONITOR:   PO intake, Supplement acceptance, Weight trends, Labs, I & O's  REASON FOR ASSESSMENT:   Consult Assessment of nutrition requirement/status  ASSESSMENT:   81 year old female from [place] Arabic speaking with history of atrial fibrillation, hepatitis C with cirrhosis, aortic stenosis, hypothyroidism who presented from the infectious disease office with nausea, jaundice, epigastric abdominal pain associated with Zepatier which was started 20 days prior to admission for treatment of her hepatitis C. 10 days prior to admission patient noticed she started to get jaundiced , Lab work obtained showed a bilirubin level of 23.5 without phosphatase of 294 abdomen of 2.7 AST 159 ALT of 93 Since admission, she was found to have a liver mass and high grade stenosis of the common hepatic duct, underwent brush biopsy and ext biliary drain placement 9/10 with plans for internalization of drains.  Meal completion has been varied from 0-55%. Per Epic weight records, pt with a 5.1% weight loss in 1 month. Pt with no observed significant fat or muscle mass loss. Noted, pt with multiple snacks/food items at bedside brought in from home. Pt encouraged to have adequate PO intake. RD to order Ensure to aid in caloric and protein needs.   Labs: Low sodium and calcium. High AST, alkaline phosphatase, total bilirubin.   Diet Order:  Diet regular Room service appropriate?: Yes; Fluid consistency:: Thin  Skin:   (Incision on R abdomen, +1 LE edema)  Last BM:  9/15  Height:   Ht Readings from Last 1 Encounters:   04/14/15 5' 0.63" (1.54 m)    Weight:   Wt Readings from Last 1 Encounters:  04/13/15 112 lb 7 oz (51 kg)    Ideal Body Weight:  45.45 kg  BMI:  Body mass index is 21.5 kg/(m^2).  Estimated Nutritional Needs:   Kcal:  1600-1800  Protein:  75-85 grams  Fluid:  1.6 - 1.8 L/day  EDUCATION NEEDS:   No education needs identified at this time  Corrin Parker, MS, RD, LDN Pager # [phone] After hours/ weekend pager # [phone]

## 2015-04-15 NOTE — Consult Note (Signed)
WOC wound consult note Reason for Consult: Consult requested for leaking biliary drain; requested to apply drainage bag around the site since the dressing is becoming frequently saturated and skin surrounding site is high risk for breakdown. Wound type: Full thickness wound to prei-wound tube insertion site to right abd, approx .1cm surrounding tube. Drainage (amount, consistency, odor) Large amt yellow drainage  Dressing procedure/placement/frequency: Tube is sutured in place to abd, butterfly tube holder was removed to allow for pouch application.  Applied one piece urostomy pouch with barrier ring to maintain seal and protect skin from drainage.  If this topical treatment is not successful in managing drainage and the pouch begins to leak, then previous plan of care with dry dressings and frequent dressing changes can be resumed.  Butterfly tube holder would need to be replaced and drainage bag re-applied to bottom of tube.  Discussed plan of care with primary team at bedside, and patient's daughter also present and translated to the patient.  If tube drainage continues, then replacement in interventional radiology may need to be considered. Please re-consult if further assistance is needed.  Thank-you,  Julien Girt MSN, [place], [place], [place], [place]

## 2015-04-15 NOTE — Progress Notes (Signed)
Marland Kitchen   HEMATOLOGY/ONCOLOGY INPATIENT PROGRESS NOTE  Date of Service: 04/15/2015  Inpatient Attending: .No att. providers found   SUBJECTIVE  Patient was seen this evening. She notes poor po intake. No significant abdominal pain. Still with significant juandice. No other acute new symptoms. Being discharged today. I called and talked with her dr Geoffery Lyons to answer her questions and to help setup out patient followup in the oncology clinic. I discussed with Dr Waldron Labs (hospitalist) - he notes that patient has been setup for an appointment to be seen by a hepatobiliary surgeon at [facility].  OBJECTIVE: PHYSICAL EXAMINATION: . Filed Vitals:   04/14/15 2211 04/15/15 0635 04/15/15 0938 04/15/15 1643  BP: 101/45 114/49 107/64 106/52  Pulse: 60 58 56 59  Temp: 98.1 F (36.7 C) 98.3 F (36.8 C) 98.7 F (37.1 C) 98.1 F (36.7 C)  TempSrc: Oral Oral Oral Oral  Resp: 16 16 18 18   Height:      Weight:      SpO2: 97% 98% 97% 99%   Filed Weights   04/11/15 2100 04/12/15 2230 04/13/15 1900  Weight: 115 lb 1.3 oz (52.2 kg) 116 lb 13.5 oz (53 kg) 112 lb 7 oz (51 kg)   .Body mass index is 21.5 kg/(m^2).  GENERAL:alert, overtly icteric skin SKIN: icteric /jaundiced skin EYES: icteric sclera OROPHARYNX:no exudate, no erythema and lips, buccal mucosa, and tongue normal  NECK: supple, no JVD, thyroid normal size, non-tender, without nodularity LYMPH: no palpable lymphadenopathy in the cervical, axillary or inguinal LUNGS: clear to auscultation with normal respiratory effort HEART: regular rate & rhythm, no murmurs and no lower extremity edema ABDOMEN: abd mild distended. normoactive bowel sounds. Musculoskeletal: no cyanosis of digits and no clubbing  PSYCH: alert & oriented x 3 with fluent speech NEURO: no focal motor/sensory deficits  MEDICAL HISTORY:  Past Medical History  Diagnosis Date  . Hypothyroidism   . Osteoporosis   . Atrial fibrillation     no anticoagulation.   . Jaundice  04/06/2015    Sudden onset, 10 days following initiation of Zepatier for Hep C.   . Abdominal distention 04/06/2015  . Aortic stenosis 10/2014    by echo.   . Hypercholesterolemia   . History of blood transfusion ~ 2006    "related to bleeding stomach ulcer"  . Bleeding stomach ulcer ~ 2006  . Hepatitis C ~ 2006    needle stick in 1970s    SURGICAL HISTORY: Past Surgical History  Procedure Laterality Date  . Hip fracture surgery Right 2012  . Fracture surgery    . Cataract extraction w/ intraocular lens  implant, bilateral Bilateral 1990's    SOCIAL HISTORY: Social History   Social History  . Marital Status: Widowed    Spouse Name: N/A  . Number of Children: N/A  . Years of Education: N/A   Occupational History  . Not on file.   Social History Main Topics  . Smoking status: Former Smoker -- 0.12 packs/day for 10 years    Types: Cigarettes  . Smokeless tobacco: Never Used     Comment: "quit smoking in the 1990's"  . Alcohol Use: No  . Drug Use: No  . Sexual Activity: No   Other Topics Concern  . Not on file   Social History Narrative    FAMILY HISTORY: Family History  Problem Relation Age of Onset  . Stroke Mother     ALLERGIES:  has No Known Allergies.  MEDICATIONS:  Scheduled Meds: . feeding supplement (ENSURE ENLIVE)  237 mL Oral BID BM  . levothyroxine  88 mcg Oral QAC breakfast  . metoprolol tartrate  12.5 mg Oral BID  . polyethylene glycol  17 g Oral Daily   Continuous Infusions: . sodium chloride Stopped (04/15/15 1828)   PRN Meds:.alum & mag hydroxide-simeth, HYDROmorphone (DILAUDID) injection, iohexol, iohexol, ondansetron **OR** ondansetron (ZOFRAN) IV, ondansetron  REVIEW OF SYSTEMS:    10 Point review of Systems was done is negative except as noted above.   LABORATORY DATA:  I have reviewed the data as listed  . CBC Latest Ref Rng 04/14/2015 04/13/2015 04/12/2015  WBC 4.0 - 10.5 K/uL 9.0 7.3 7.0  Hemoglobin 12.0 - 15.0 g/dL 13.9 13.0  12.7  Hematocrit 36.0 - 46.0 % 40.8 37.0 36.0  Platelets 150 - 400 K/uL 208 181 163    . CMP Latest Ref Rng 04/15/2015 04/14/2015 04/13/2015  Glucose 65 - 99 mg/dL 93 94 82  BUN 6 - 20 mg/dL 17 18 13   Creatinine 0.44 - 1.00 mg/dL 0.80 1.15(H) 0.79  Sodium 135 - 145 mmol/L 134(L) 135 135  Potassium 3.5 - 5.1 mmol/L 4.4 5.0 4.3  Chloride 101 - 111 mmol/L 104 102 102  CO2 22 - 32 mmol/L 22 23 25   Calcium 8.9 - 10.3 mg/dL 7.4(L) 8.0(L) 7.9(L)  Total Protein 6.5 - 8.1 g/dL 5.9(L) 6.8 6.0(L)  Total Bilirubin 0.3 - 1.2 mg/dL 14.1(H) 15.8(H) 13.8(H)  Alkaline Phos 38 - 126 U/L 143(H) 175(H) 158(H)  AST 15 - 41 U/L 72(H) 80(H) 65(H)  ALT 14 - 54 U/L 35 41 39     RADIOGRAPHIC STUDIES: I have personally reviewed the radiological images as listed and agreed with the findings in the report. Ct Abdomen Pelvis W Wo Contrast  04/08/2015   CLINICAL DATA:  81 year old female with hepatitis-C, jaundice, hypothyroidism and abdominal distention. Possible portal vein thrombosis on recent sonogram.  EXAM: CT ABDOMEN AND PELVIS WITHOUT AND WITH CONTRAST  TECHNIQUE: Multidetector CT imaging of the abdomen and pelvis was performed following the standard protocol before and following the bolus administration of intravenous contrast.  CONTRAST:  1107mL OMNIPAQUE IOHEXOL 300 MG/ML  SOLN  COMPARISON:  04/07/2015 abdominal sonogram.  FINDINGS: Lower chest: Cardiomegaly. There is atherosclerosis of the thoracic aorta, the great vessels of the mediastinum and the coronary arteries, including calcified atherosclerotic plaque in the left anterior descending, left circumflex and right coronary arteries. Aortic valvular and mitral annular calcifications are present. Subsegmental dependent atelectasis at both lung bases.  Hepatobiliary: The liver surface is diffusely irregular and there is asymmetric hypertrophy of the lateral left liver lobe, indicating cirrhosis. There is an ill-defined 2.9 x 2.2 cm liver mass within the central  segment 8 right liver lobe (series 501/image 49), which demonstrates progressive heterogeneous enhancement. There is marked diffuse intrahepatic biliary ductal dilatation. There is an enhancing tumor thrombus at the main portal vein bifurcation extending into the proximal left and right portal vein branches. The gallbladder is contracted. The common bile duct is normal caliber (4 mm diameter). There are no additional liver masses.  Pancreas: No pancreatic mass. Ill-defined peripancreatic fluid throughout the length of the pancreas. No focal peripancreatic fluid collection. No main pancreatic duct dilation.  Spleen: Top normal size spleen (12.2 cm craniocaudal splenic length). No splenic mass.  Adrenals/Urinary Tract: Normal adrenals. Simple 1.0 cm posterior interpolar left renal cyst. Simple 1.1 cm exophytic lower left renal cyst. Additional subcentimeter hypodense left renal lesions, too small to characterize. No hydronephrosis. No nephrolithiasis. Visualized ureters are normal caliber.  On delayed imaging, there is no urothelial wall thickening and there are no filling defects in the opacified portions of the bilateral collecting systems or visualized proximal ureters.  Stomach/Bowel: Relatively collapsed and grossly normal stomach normal caliber small bowel, with no appreciable small bowel wall thickening normal appendix. Normal large bowel, with no large bowel wall thickening.  Vascular/Lymphatic: Atherosclerotic nonaneurysmal abdominal aorta. Patent splenic, hepatic and renal veins. Enlarged 1.4 cm short axis gastrohepatic ligament lymph node (series 501/ image 65). Mild porta hepatis lymphadenopathy.  Reproductive: Grossly normal uterus. Simple 2.3 cm left adnexal cystic structure (series 501/image 152).  Other: Small volume ascites.  No pneumoperitoneum.  Musculoskeletal: Partially visualized right total hip arthroplasty. Mild T10 and L1 vertebral body compression fractures and severe L2 vertebral body  compression fracture of indeterminate chronicity. Mild to moderate degenerative changes in the visualized thoracolumbar spine. No aggressive appearing focal osseous lesions.  IMPRESSION: 1. 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 A cholangiocarcinoma (Klatskin tumor) is favored given the location and biliary ductal dilatation, however hepatocellular carcinoma is on the differential given the underlying cirrhosis and the presence of portal vein tumor thrombus. 2. Small volume ascites.  Top-normal size spleen. 3. Mild porta hepatis and gastrohepatic ligament lymphadenopathy, nonspecific. 4. Mild T10 and L1 and superior L2 vertebral body compression fractures of indeterminate chronicity, likely chronic. 5. Cardiomegaly. Atherosclerosis, including three-vessel coronary artery disease. Please note that although the presence of coronary artery calcium documents the presence of coronary artery disease, the severity of this disease and any potential stenosis cannot be assessed on this non-gated CT examination. 6. Simple benign-appearing 2.3 cm left adnexal cyst. No follow-up is required. This recommendation follows ACR consensus guidelines: White Paper of the ACR Incidental Findings Committee II on Adnexal Findings. J Am Coll Radiol [phone]. These results were called by telephone at the time of interpretation on 04/08/2015 at 8:55 am to Dr. Nicoletta Ba , who verbally acknowledged these results.   Electronically Signed   By: Ilona Sorrel M.D.   On: 04/08/2015 09:06   Ct Chest W Contrast  04/15/2015   CLINICAL DATA:  81 year old with current history of hepatitis-C and cirrhosis, presenting this admission with a central liver mass causing biliary obstruction, hepatocellular carcinoma versus cholangiocarcinoma. Staging  CT to evaluate for metastatic disease.  EXAM: CT CHEST WITH CONTRAST  TECHNIQUE: Multidetector CT imaging of the chest was performed during intravenous contrast administration.  CONTRAST:  60 mL OMNIPAQUE IOHEXOL 300 MG/ML IV.  COMPARISON:  None.  FINDINGS: Lungs: No evidence of pulmonary metastasis. Atelectasis in the lower lobes. No confluent airspace consolidation. No evidence of interstitial lung disease.  Trachea/bronchi: Central airways patent without significant bronchial wall thickening. Calcification of the tracheobronchial cartilages.  Pleura:  No pleural plaques or masses.  No pleural effusions.  Mediastinum:  No mediastinal masses.  Heart and Vascular: Heart markedly enlarged. Moderate to marked left ventricular hypertrophy. Extensive mitral annular calcification. Extensive aortic valvular calcification. Moderate LAD coronary atherosclerosis. No pericardial effusion. Moderate to marked atherosclerosis involving the thoracic aorta without aneurysm or dissection.  Lymphatic: No pathologic mediastinal, hilar or axillary lymphadenopathy.  Other findings: None.  Visualized lower neck: Thyroid gland atrophic. No evidence of supraclavicular lymphadenopathy.  Visualized upper abdomen: Biliary drainage catheters in place in the liver with residual moderate to marked biliary ductal dilation in the posterior segment right  lobe. Left lobe bile ducts decompressed. Central hepatic mass as detailed on the prior CT abdomen and pelvis examination 1 week ago.  Musculoskeletal: Osseous demineralization with a mild compression fracture of the upper endplate of L45 which does not appear acute and is felt to be due to osteoporosis. No evidence of osseous metastatic disease.  IMPRESSION: 1. No evidence of metastatic disease in the thorax. 2. Marked cardiomegaly with moderate to marked left ventricular hypertrophy, extensive mitral annular calcification and extensive aortic valvular calcification. 3. Biliary drainage catheters  in place in the visualized liver with residual biliary ductal dilation in the posterior segment of the right lobe. The left lobe bile ducts are decompressed. 4. Central hepatic mass as detailed on the prior CT abdomen pelvis examination 1 week ago.   Electronically Signed   By: Evangeline Dakin M.D.   On: 04/15/2015 07:55   US Abdomen Complete  04/06/2015   CLINICAL DATA:  Acute liver failure.  Jaundice.  EXAM: ULTRASOUND ABDOMEN COMPLETE  COMPARISON:  None.  FINDINGS: Gallbladder: Gallbladder is not visualized and may be surgically absent or contracted.  Common bile duct: Diameter: 5.3 mm, normal  Liver: Coarsened parenchymal echotexture with nodular contour suggesting hepatic cirrhosis. Prominence of portal veins with flow in the appropriate direction. Suggestion of mild intrahepatic bile duct dilatation. No focal liver lesions identified.  IVC: No abnormality visualized.  Pancreas: Visualized portion unremarkable.  Spleen: Spleen appears enlarged with homogeneous parenchymal echotexture.  Right Kidney: Length: 10.3 cm. Echogenicity within normal limits. No mass or hydronephrosis visualized.  Left Kidney: Length: 9.6 cm. Small cyst demonstrated in the lower pole measuring up to about 1 cm diameter. No hydronephrosis.  Abdominal aorta: No aneurysm visualized.  Other findings: None.  IMPRESSION: Coarsened liver echotexture with nodular contour and prominent pulmonary venous structures consistent with hepatic cirrhosis. No focal lesions identified. Suggestion of intrahepatic bile duct dilatation without extrahepatic ductal dilatation. Gallbladder not visualized. Probable splenic enlargement.   Electronically Signed   By: Lucienne Capers M.D.   On: 04/06/2015 23:51   [place] Art/ven Flow Abd Pelv Doppler  04/07/2015   CLINICAL DATA:  Jaundice, hepatic cirrhosis.  EXAM: DUPLEX ULTRASOUND OF LIVER  TECHNIQUE: Color and duplex Doppler ultrasound was performed to evaluate the hepatic in-flow and out-flow vessels. Exam is  significantly limited as patient does not speak English and unable to follow breathing instructions.  COMPARISON:  None.  FINDINGS: Portal Vein Velocities  Main: 18.1 cm/sec proximally ; hepatopetal flow is noted proximally and in the midportion, but no flow is noted distally consistent with portal venous thrombus.  Right:  Not visualized due to limitations described above.  Left:  Not visualized due to limitations described above.  Hepatic Vein Velocities  Right:  20.5 cm/sec ; hepatofugal flow is noted.  Middle:  Not visualized due to limitations described above.  Left:  29.7 Cm/sec ; hepatofugal flow is noted.  Hepatic Artery Velocity:  146 cm/sec  Splenic Vein Velocity:  17.1 cm/sec  Varices: Present.  Ascites: Present.  IMPRESSION: Exam is significantly limited as patient does not speak English and unable to follow breathing instructions. There is probable occlusive thrombus seen in the distal portal vein. Middle hepatic vein is not visualized due to previously described limitations, but right and left hepatic veins demonstrate normal flow and velocity. These results will be called to the ordering clinician or representative by the Radiologist Assistant, and communication documented in the PACS or zVision Dashboard.   Electronically Signed   By: Jeneen Rinks  Murlean Caller, M.D.   On: 04/07/2015 08:12   Ir Biliary Drain Placement With Cholangiogram  04/14/2015   CLINICAL DATA:  Biliary obstruction.  Klatskin tumor.  EXAM: IR CONVERT BILIARY DRAIN TO INT-EXT BILARY DRAIN; IR BILIARY DRAIN PLACEMENT W/ CHOLANGIOGRAM  FLUOROSCOPY TIME:  22 minutes and 54 seconds  MEDICATIONS AND MEDICAL HISTORY: Versed 2 mg, Fentanyl 100 mcg.  Additional Medications: 3.75 g of Zosyn.  ANESTHESIA/SEDATION: Moderate sedation time: 55 minutes  CONTRAST:  40 cc Omnipaque 300  PROCEDURE: The procedure, risks, benefits, and alternatives were explained to the patient. Questions regarding the procedure were encouraged and answered. The patient  understands and consents to the procedure.  The abdomen and right flank were prepped with Betadine in a sterile fashion, and a sterile drape was applied covering the operative field. A sterile gown and sterile gloves were used for the procedure.  The right biliary drain was exchanged over a Bentson wire for an 8 [place] sheath. Sheath cholangiogram was performed. A Kumpe the catheter was advanced over the Bentson wire to the central biliary tree. Exhaustive attempts were made to advance the catheter across the obstruction into the common bile duct. Wires utilized included roadrunner an glide wires. Catheters utilized included a copy catheter, cobra catheter, and Sos Omni catheter.  Under sonographic guidance, a 21 gauge needle was inserted into a peripheral left duct. This was removed over a 018 wire which was up sized to a 3 J. a Kumpe the catheter was easily advanced over the J-wire, across the obstruction and into the common bile duct. This was advanced over a glidewire into the duodenum. It was exchanged over an Amplatz wire for a 10 [place] biliary drain. Several additional sideholes were cut above the marker. The right biliary sheath was exchanged for a Seffner strain. This was advanced in the central biliary tree. Contrast was injected into both catheters. A sample was sent for cytology.  FINDINGS: Sheath cholangiography confirms central biliary obstruction.  Subsequent images demonstrate placement of a left internal external biliary drain with its tip in the duodenum. The right external biliary drain is coiled in the central biliary tree.  COMPLICATIONS: None  IMPRESSION: Successful left internal external biliary drain with passage across the obstruction. The right biliary drain could not be passed across the obstruction secondary to suboptimal geometry. The patient will return next week for an additional right biliary drain.   Electronically Signed   By: Marybelle Killings M.D.   On: 04/14/2015 18:15    Ir Biliary Drain Placement With Cholangiogram  04/09/2015   CLINICAL DATA:  81 year old female presents with jaundice, hyperbilirubinemia, and imaging diagnosis of hilar tumor in the liver. CT demonstrates intrahepatic biliary ductal dilatation.  She has been referred for percutaneous transhepatic cholangiogram and drainage.  EXAM: IR BILIARY DRAIN PLACEMENT W/ CHOLANGIOGRAM; IR ENDOLUMNICAL BIOPSY OF BILIARY TREE  COMPARISON:  CT 04/07/2015  ANESTHESIA/SEDATION: 4.0 IV Versed; 2 high mcg IV Fentanyl.  Total Moderate Sedation Time: 45 minutes.  CONTRAST:  4mL OMNIPAQUE IOHEXOL 300 MG/ML  SOLN  MEDICATIONS: Receiving Zosyn  Procedural  FLUOROSCOPY TIME:  18 minutes 36 second  TECHNIQUE: The procedure, risks, benefits, and alternatives were explained to the patient and the patient's family. A complete informed consent was performed, with risk benefit analysis. Specific risks that were discussed for the procedure include bleeding, infection, biliary sepsis, IC use day, organ injury, need for further procedure, need for further surgery, long-term drain placement, cardiopulmonary collapse, death. Questions regarding the procedure were  encouraged and answered. The patient understands and consents to the procedure.  Patient is position in supine position on the fluoroscopy table, and the upper abdomen was prepped and draped in the usual sterile fashion. Maximum barrier sterile technique with sterile gowns and gloves were used for the procedure. A timeout was performed prior to the initiation of the procedure. Local anesthesia was provided with 1% lidocaine with epinephrine.  Ultrasound survey of the right liver lobe was performed. Window into the right biliary system was present for the right liver approach.  1% lidocaine was used for local anesthesia, with generous infiltration of the skin and subcutaneous tissues in and inter left costal location. A Chiba needle was advanced under ultrasound guidance into the right  liver lobe.  Once the tip of the needle was confirmed within the biliary system by injecting small aliquots of contrast, images were stored of the biliary system after partially opacifying the biliary tree via the needle.  Once the tip of this needle was confirmed within the biliary system, an 018 wire was advanced centrally. The needle was removed, a small incision was made with an 11 blade scalpel.  An Accustick system would not advance into the biliary system, and [place] a micropuncture system was advanced over the 018 wire into the biliary tree. The inner dilator and wire were removed, and an 035 catheter was advanced into the biliary system. A standard 6 French sheath was then placed in the biliary system.  Cholangiogram was then performed.  A combination of Glidewire, 4 [place] glide cath, 5 [place] cobra catheter, as well as a 6 [place] beacon tip 35 cm sheath used to attempt to navigate through the obstruction with no success.  A standard pigtail 10 French drainage catheter was placed over the [employer].  We confirmed placement with contrast infusion.  The patient tolerated the procedure well and remained hemodynamically stable throughout.  No complications were encountered and no significant blood loss was encountered.  COMPLICATIONS: None  FINDINGS: Ultrasound survey demonstrates dilated intrahepatic biliary ducts. Approach into the right liver lobe was identified.  Percutaneous transhepatic cholangiogram demonstrates moderate to severely dilated intrahepatic biliary ducts. There is communication of the left and right-sided ducts, and the os a right and left drain may not be required for drainage.  Dilated ducts of the segment 4B should not be confused for common hepatic duct. Common hepatic duct appears to be closer to the patient's right side.  Small amount of contrast did opacified I the common hepatic duct and traversed the common bile duct into the duodenum. Cystic duct opacified with incomplete filling of  the gallbladder.  Unable to traverse the obstruction from our initial puncture.  IMPRESSION: Status post percutaneous transhepatic cholangiogram with demonstration of high-grade stenosis of the common hepatic duct, and subsequent placement of externalized standard 10 French pigtail catheter drain.  Communication of left and right-sided ducts was demonstrated.  Signed,  Dulcy Fanny. Earleen Newport, DO  [facility]  [facility]  PLAN: Would recommend repeat through the tube cholangiogram on Monday or Tuesday of the upcoming week for drainage catheter exchange and a second attempt at traversing the obstruction.  Twice a day 10 cc saline flushes until drain exchange.   Electronically Signed   By: Corrie Mckusick D.O.   On: 04/09/2015 13:51   Ir Convert Biliary Drain To Int Ext Biliary Drain  04/14/2015   CLINICAL DATA:  Biliary obstruction.  Klatskin tumor.  EXAM: IR CONVERT BILIARY DRAIN TO INT-EXT  BILARY DRAIN; IR BILIARY DRAIN PLACEMENT W/ CHOLANGIOGRAM  FLUOROSCOPY TIME:  22 minutes and 54 seconds  MEDICATIONS AND MEDICAL HISTORY: Versed 2 mg, Fentanyl 100 mcg.  Additional Medications: 3.75 g of Zosyn.  ANESTHESIA/SEDATION: Moderate sedation time: 55 minutes  CONTRAST:  40 cc Omnipaque 300  PROCEDURE: The procedure, risks, benefits, and alternatives were explained to the patient. Questions regarding the procedure were encouraged and answered. The patient understands and consents to the procedure.  The abdomen and right flank were prepped with Betadine in a sterile fashion, and a sterile drape was applied covering the operative field. A sterile gown and sterile gloves were used for the procedure.  The right biliary drain was exchanged over a Bentson wire for an 8 [place] sheath. Sheath cholangiogram was performed. A Kumpe the catheter was advanced over the Bentson wire to the central biliary tree. Exhaustive attempts were made to advance the catheter across the obstruction into the  common bile duct. Wires utilized included roadrunner an glide wires. Catheters utilized included a copy catheter, cobra catheter, and Sos Omni catheter.  Under sonographic guidance, a 21 gauge needle was inserted into a peripheral left duct. This was removed over a 018 wire which was up sized to a 3 J. a Kumpe the catheter was easily advanced over the J-wire, across the obstruction and into the common bile duct. This was advanced over a glidewire into the duodenum. It was exchanged over an Amplatz wire for a 10 [place] biliary drain. Several additional sideholes were cut above the marker. The right biliary sheath was exchanged for a [place] strain. This was advanced in the central biliary tree. Contrast was injected into both catheters. A sample was sent for cytology.  FINDINGS: [place]ath cholangiography confirms central biliary obstruction.  Subsequent images demonstrate placement of a left internal external biliary drain with its tip in the duodenum. The right external biliary drain is coiled in the central biliary tree.  COMPLICATIONS: None  IMPRESSION: Successful left internal external biliary drain with passage across the obstruction. The right biliary drain could not be passed across the obstruction secondary to suboptimal geometry. The patient will return next week for an additional right biliary drain.   Electronically Signed   By: Marybelle Killings M.D.   On: 04/14/2015 18:15   Ir Endoluminal Bx Of Biliary Tree  04/09/2015   CLINICAL DATA:  81 year old female presents with jaundice, hyperbilirubinemia, and imaging diagnosis of hilar tumor in the liver. CT demonstrates intrahepatic biliary ductal dilatation.  She has been referred for percutaneous transhepatic cholangiogram and drainage.  EXAM: IR BILIARY DRAIN PLACEMENT W/ CHOLANGIOGRAM; IR ENDOLUMNICAL BIOPSY OF BILIARY TREE  COMPARISON:  CT 04/07/2015  ANESTHESIA/SEDATION: 4.0 IV Versed; 2 high mcg IV Fentanyl.  Total Moderate Sedation Time: 45  minutes.  CONTRAST:  37mL OMNIPAQUE IOHEXOL 300 MG/ML  SOLN  MEDICATIONS: Receiving Zosyn  Procedural  FLUOROSCOPY TIME:  18 minutes 36 second  TECHNIQUE: The procedure, risks, benefits, and alternatives were explained to the patient and the patient's family. A complete informed consent was performed, with risk benefit analysis. Specific risks that were discussed for the procedure include bleeding, infection, biliary sepsis, IC use day, organ injury, need for further procedure, need for further surgery, long-term drain placement, cardiopulmonary collapse, death. Questions regarding the procedure were encouraged and answered. The patient understands and consents to the procedure.  Patient is position in supine position on the fluoroscopy table, and the upper abdomen was prepped and draped in the usual sterile fashion. Maximum  barrier sterile technique with sterile gowns and gloves were used for the procedure. A timeout was performed prior to the initiation of the procedure. Local anesthesia was provided with 1% lidocaine with epinephrine.  Ultrasound survey of the right liver lobe was performed. Window into the right biliary system was present for the right liver approach.  1% lidocaine was used for local anesthesia, with generous infiltration of the skin and subcutaneous tissues in and inter left costal location. A Chiba needle was advanced under ultrasound guidance into the right liver lobe.  Once the tip of the needle was confirmed within the biliary system by injecting small aliquots of contrast, images were stored of the biliary system after partially opacifying the biliary tree via the needle.  Once the tip of this needle was confirmed within the biliary system, an 018 wire was advanced centrally. The needle was removed, a small incision was made with an 11 blade scalpel.  An Accustick system would not advance into the biliary system, and [place] a micropuncture system was advanced over the 018 wire into the biliary  tree. The inner dilator and wire were removed, and an 035 catheter was advanced into the biliary system. A standard 6 French sheath was then placed in the biliary system.  Cholangiogram was then performed.  A combination of Glidewire, 4 [place] glide cath, 5 [place] cobra catheter, as well as a 6 [place] beacon tip 35 cm sheath used to attempt to navigate through the obstruction with no success.  A standard pigtail 10 French drainage catheter was placed over the [employer].  We confirmed placement with contrast infusion.  The patient tolerated the procedure well and remained hemodynamically stable throughout.  No complications were encountered and no significant blood loss was encountered.  COMPLICATIONS: None  FINDINGS: Ultrasound survey demonstrates dilated intrahepatic biliary ducts. Approach into the right liver lobe was identified.  Percutaneous transhepatic cholangiogram demonstrates moderate to severely dilated intrahepatic biliary ducts. There is communication of the left and right-sided ducts, and the os a right and left drain may not be required for drainage.  Dilated ducts of the segment 4B should not be confused for common hepatic duct. Common hepatic duct appears to be closer to the patient's right side.  Small amount of contrast did opacified I the common hepatic duct and traversed the common bile duct into the duodenum. Cystic duct opacified with incomplete filling of the gallbladder.  Unable to traverse the obstruction from our initial puncture.  IMPRESSION: Status post percutaneous transhepatic cholangiogram with demonstration of high-grade stenosis of the common hepatic duct, and subsequent placement of externalized standard 10 French pigtail catheter drain.  Communication of left and right-sided ducts was demonstrated.  Signed,  Dulcy Fanny. Earleen Newport, DO  [facility]  [facility] Radiology  PLAN: Would recommend repeat through the tube cholangiogram on Monday or Tuesday  of the upcoming week for drainage catheter exchange and a second attempt at traversing the obstruction.  Twice a day 10 cc saline flushes until drain exchange.   Electronically Signed   By: Corrie Mckusick D.O.   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 The bilirubin on admission was 23.5. Her CT of the abdomen and pelvis showed right hepatic lobe mass concerning for hepatocellular carcinoma versus any  hilar cholangiocarcinoma/[place] tumor.concern for portal vein tumor infiltration. Patient has had left-sided external/internal biliary drain placed but right-sided is only external. Brush biopsy was done x2 are nondiagnostic. AFP tumor marker 46.6. CA 19-9 was ordered and is currently pending. CT chest with no overt evidence of metastases Plan -patient will f/u with IR for better rt sided drainage. -after drainage procedure patient to get outpatient MRI/MRCP -setup for hepato-biliary surgery appointment at [place] like not be a candidate for surgery given poor functional status, tumor invasion of portal vein, significant baseline liver disease from hep C, cirrhosis, poor nutritional status. --IR to weight in about possible option for chemo/radio-embolization of the tumor. --consider biopsy if surgery not planned --based on surgery and IR input will decide on further treatment options ?palliative /adjuvant chemoradiation with infusional 5FU or gemcitaine --f/u CA 19-9 -discussed findings in detail with patients daughter. Give her my clinic card to set up followup. -placed orders for clinic followup on 9/26 0r 9/27 with me ---sent message to oncology schedulers/my RN -ongoing goals of care discussion  #2 Hepatitis C and cirrhosis -  patient on started on Zepatier about 10 days prior to admission - held due to elevated LFts and current new diagnosis  #3 Osteoporosis with osteoporotic spine fractures. -fall precautions.  #4 Atrial fibrillation - not on anticoagulation  #5 Aortic stenosis and CT evidence of coronary calcium deposits concerning for multivessel coronary artery disease    I spent 25 minutes counseling the patient face to face. The total time spent in the appointment was 35 minutes and more than 50% was on counseling and direct patient cares.    Sullivan Lone MD [place] AAHIVMS [facility] Hematology/Oncology Physician [facility]  (Office):       [phone] (Work cell):  [phone] (Fax):           [phone]  04/15/2015 10:26 PM

## 2015-04-16 LAB — CEA: CEA: 5.4 ng/mL — AB (ref 0.0–4.7)

## 2015-04-18 ENCOUNTER — Other Ambulatory Visit: Payer: Self-pay | Admitting: *Deleted

## 2015-04-18 ENCOUNTER — Telehealth: Payer: Self-pay | Admitting: Hematology

## 2015-04-18 NOTE — Telephone Encounter (Signed)
per pof to sch pt appt-cld & spoke to pt and gave pt appt time & date-pt understood and wanted to speak with Dr Irene Limbo nurse-trnas to [phone]

## 2015-04-19 LAB — CANCER ANTIGEN 19-9: CA 19 9: 734 U/mL — AB (ref 0–35)

## 2015-04-20 ENCOUNTER — Other Ambulatory Visit: Payer: Self-pay | Admitting: Radiology

## 2015-04-20 ENCOUNTER — Other Ambulatory Visit (HOSPITAL_COMMUNITY): Payer: Self-pay | Admitting: Interventional Radiology

## 2015-04-20 DIAGNOSIS — K831 Obstruction of bile duct: Secondary | ICD-10-CM

## 2015-04-21 ENCOUNTER — Inpatient Hospital Stay (HOSPITAL_COMMUNITY)
Admission: RE | Admit: 2015-04-21 | Discharge: 2015-04-21 | Disposition: A | Discharge status: Home / Self Care | Payer: No Typology Code available for payment source | Source: Ambulatory Visit | Attending: Interventional Radiology | Admitting: Interventional Radiology

## 2015-04-21 MED ORDER — CIPROFLOXACIN IN D5W 400 MG/200ML IV SOLN
400.0000 mg | Freq: Once | INTRAVENOUS | Status: DC
  Filled 2015-04-21: qty 200

## 2015-04-22 ENCOUNTER — Ambulatory Visit: Payer: No Typology Code available for payment source | Admitting: Family Medicine

## 2015-04-22 ENCOUNTER — Other Ambulatory Visit: Payer: Self-pay | Admitting: Radiology

## 2015-04-25 ENCOUNTER — Ambulatory Visit (HOSPITAL_COMMUNITY)
Admission: RE | Admit: 2015-04-25 | Discharge: 2015-04-25 | Disposition: A | Discharge status: Home / Self Care | Payer: No Typology Code available for payment source | Source: Ambulatory Visit | Attending: Interventional Radiology | Admitting: Interventional Radiology

## 2015-04-25 ENCOUNTER — Other Ambulatory Visit: Payer: No Typology Code available for payment source

## 2015-04-25 ENCOUNTER — Ambulatory Visit: Payer: No Typology Code available for payment source | Admitting: Hematology

## 2015-04-25 ENCOUNTER — Other Ambulatory Visit (HOSPITAL_COMMUNITY): Payer: Self-pay | Admitting: Interventional Radiology

## 2015-04-25 ENCOUNTER — Encounter (HOSPITAL_COMMUNITY): Payer: Self-pay

## 2015-04-25 ENCOUNTER — Encounter (HOSPITAL_COMMUNITY): Payer: MEDICAID

## 2015-04-25 DIAGNOSIS — I350 Nonrheumatic aortic (valve) stenosis: Secondary | ICD-10-CM | POA: Insufficient documentation

## 2015-04-25 DIAGNOSIS — R17 Unspecified jaundice: Secondary | ICD-10-CM | POA: Insufficient documentation

## 2015-04-25 DIAGNOSIS — N281 Cyst of kidney, acquired: Secondary | ICD-10-CM | POA: Insufficient documentation

## 2015-04-25 DIAGNOSIS — I2510 Atherosclerotic heart disease of native coronary artery without angina pectoris: Secondary | ICD-10-CM | POA: Insufficient documentation

## 2015-04-25 DIAGNOSIS — I4891 Unspecified atrial fibrillation: Secondary | ICD-10-CM | POA: Insufficient documentation

## 2015-04-25 DIAGNOSIS — K831 Obstruction of bile duct: Secondary | ICD-10-CM

## 2015-04-25 DIAGNOSIS — I700 Atherosclerosis of aorta: Secondary | ICD-10-CM | POA: Insufficient documentation

## 2015-04-25 DIAGNOSIS — B1920 Unspecified viral hepatitis C without hepatic coma: Secondary | ICD-10-CM | POA: Insufficient documentation

## 2015-04-25 DIAGNOSIS — E780 Pure hypercholesterolemia: Secondary | ICD-10-CM | POA: Insufficient documentation

## 2015-04-25 DIAGNOSIS — Z87891 Personal history of nicotine dependence: Secondary | ICD-10-CM | POA: Insufficient documentation

## 2015-04-25 DIAGNOSIS — Z4682 Encounter for fitting and adjustment of non-vascular catheter: Secondary | ICD-10-CM | POA: Insufficient documentation

## 2015-04-25 DIAGNOSIS — E039 Hypothyroidism, unspecified: Secondary | ICD-10-CM | POA: Insufficient documentation

## 2015-04-25 DIAGNOSIS — R140 Abdominal distension (gaseous): Secondary | ICD-10-CM | POA: Insufficient documentation

## 2015-04-25 DIAGNOSIS — I517 Cardiomegaly: Secondary | ICD-10-CM | POA: Insufficient documentation

## 2015-04-25 DIAGNOSIS — K7460 Unspecified cirrhosis of liver: Secondary | ICD-10-CM | POA: Insufficient documentation

## 2015-04-25 LAB — PROTIME-INR
INR: 1.65 — ABNORMAL HIGH (ref 0.00–1.49)
PROTHROMBIN TIME: 19.6 s — AB (ref 11.6–15.2)

## 2015-04-25 LAB — COMPREHENSIVE METABOLIC PANEL
ALT: 35 U/L (ref 14–54)
ANION GAP: 9 (ref 5–15)
AST: 83 U/L — ABNORMAL HIGH (ref 15–41)
Albumin: 2.1 g/dL — ABNORMAL LOW (ref 3.5–5.0)
Alkaline Phosphatase: 161 U/L — ABNORMAL HIGH (ref 38–126)
BILIRUBIN TOTAL: 23.9 mg/dL — AB (ref 0.3–1.2)
BUN: 32 mg/dL — AB (ref 6–20)
CO2: 18 mmol/L — ABNORMAL LOW (ref 22–32)
Calcium: 8.8 mg/dL — ABNORMAL LOW (ref 8.9–10.3)
Chloride: 104 mmol/L (ref 101–111)
Creatinine, Ser: 1.21 mg/dL — ABNORMAL HIGH (ref 0.44–1.00)
GFR calc Af Amer: 47 mL/min — ABNORMAL LOW (ref >60)
GFR, EST NON AFRICAN AMERICAN: 40 mL/min — AB (ref >60)
Glucose, Bld: 124 mg/dL — ABNORMAL HIGH (ref 65–99)
POTASSIUM: 4.9 mmol/L (ref 3.5–5.1)
Sodium: 131 mmol/L — ABNORMAL LOW (ref 135–145)
TOTAL PROTEIN: 6.7 g/dL (ref 6.5–8.1)

## 2015-04-25 LAB — CBC WITH DIFFERENTIAL/PLATELET
BASOS ABS: 0 10*3/uL (ref 0.0–0.1)
BASOS PCT: 0 %
EOS ABS: 0.1 10*3/uL (ref 0.0–0.7)
EOS PCT: 1 %
HEMATOCRIT: 41.1 % (ref 36.0–46.0)
Hemoglobin: 14.8 g/dL (ref 12.0–15.0)
Lymphocytes Relative: 11 %
Lymphs Abs: 1.4 10*3/uL (ref 0.7–4.0)
MCH: 33 pg (ref 26.0–34.0)
MCHC: 36 g/dL (ref 30.0–36.0)
MCV: 91.5 fL (ref 78.0–100.0)
Monocytes Absolute: 1.3 10*3/uL — ABNORMAL HIGH (ref 0.1–1.0)
Monocytes Relative: 10 %
NEUTROS PCT: 78 %
Neutro Abs: 9.7 10*3/uL — ABNORMAL HIGH (ref 1.7–7.7)
Platelets: 174 10*3/uL (ref 150–400)
RBC: 4.49 MIL/uL (ref 3.87–5.11)
RDW: 20.3 % — AB (ref 11.5–15.5)
WBC: 12.4 10*3/uL — AB (ref 4.0–10.5)

## 2015-04-25 MED ORDER — MIDAZOLAM HCL 2 MG/2ML IJ SOLN
INTRAMUSCULAR | Status: AC
  Filled 2015-04-25: qty 2

## 2015-04-25 MED ORDER — MIDAZOLAM HCL 2 MG/2ML IJ SOLN
INTRAMUSCULAR | Status: AC | PRN
  Administered 2015-04-25 (×2): 1 mg via INTRAVENOUS

## 2015-04-25 MED ORDER — IOHEXOL 300 MG/ML  SOLN
50.0000 mL | Freq: Once | INTRAMUSCULAR | Status: DC | PRN
  Administered 2015-04-25: 20 mL
  Filled 2015-04-25: qty 50

## 2015-04-25 MED ORDER — CIPROFLOXACIN IN D5W 400 MG/200ML IV SOLN
400.0000 mg | Freq: Once | INTRAVENOUS | Status: AC
  Administered 2015-04-25: 400 mg via INTRAVENOUS
  Filled 2015-04-25: qty 200

## 2015-04-25 MED ORDER — OXYCODONE HCL 5 MG PO TABS
5.0000 mg | ORAL_TABLET | Freq: Once | ORAL | Status: AC
  Administered 2015-04-25: 5 mg via ORAL

## 2015-04-25 MED ORDER — OXYCODONE HCL 5 MG PO TABS
ORAL_TABLET | ORAL | Status: AC
  Administered 2015-04-25: 5 mg via ORAL
  Filled 2015-04-25: qty 1

## 2015-04-25 MED ORDER — LIDOCAINE HCL 1 % IJ SOLN
INTRAMUSCULAR | Status: AC
  Filled 2015-04-25: qty 20

## 2015-04-25 MED ORDER — FENTANYL CITRATE (PF) 100 MCG/2ML IJ SOLN
INTRAMUSCULAR | Status: AC
  Filled 2015-04-25: qty 2

## 2015-04-25 MED ORDER — FENTANYL CITRATE (PF) 100 MCG/2ML IJ SOLN
INTRAMUSCULAR | Status: AC | PRN
  Administered 2015-04-25 (×2): 50 ug via INTRAVENOUS

## 2015-04-25 NOTE — Procedures (Signed)
Interventional Radiology Procedure Note  Procedure:  Through the tube cholangiogram via left and right access.  Fluoro guided exchange of left Int-Ext biliary drain, terminating in the duodenum.  Removal of right sided access, as there is communication with the right and left ducts at the hilum, drained by the left tube.   Complications: None.  Recommendations:  - Ok to shower tomorrow - Do not submerge  - Will discuss with the daughter regarding follow-up CMP and capping for trial.  - Will encourage her to observe her scheduled appointments at [facility]. - Patient has PET-CT scheduled today.  - Routine care   Signed,  Dulcy Fanny. Earleen Newport, DO

## 2015-04-25 NOTE — Discharge Instructions (Signed)
Biliary Drainage Catheter Home Guide A biliary drainage catheter is a tube that is inserted through your skin into the bile ducts in your liver. The purpose of a biliary drainage catheter is to prevent backup of bile into the liver. Bile is a thick yellow or green fluid that helps digest fat in foods. Backup of bile can occur when there is a blockage preventing bile from moving from the bile ducts into your small intestine, as it normally should. This can occur from a tumor, gallstones, or scar tissue. There are three types of biliary drainage:  External biliary drainage--With this type, bile is only drained into a collection bag outside your body (external collection bag).  Internal-external biliary drainage--Bile is drained to an external collection bag as well as into your small intestine.  Internal biliary drainage--Bile is only drained into your small intestine. HOW DO I CHANGE MY DRESSING? The dressing over the drain should be changed at least every other day or more frequently if needed to keep the dressing dry.  1. Wash your hands with soap and water. 2. Gently remove the old dressing. Avoid using scissors to remove the dressing because this may lead to accidental damage to the drain. 3. Once the dressing is removed, inspect the skin around the drain for redness, swelling, and foul smelling yellow or green discharge. 4. If the drain was sutured to the skin, inspect the suture to verify that it is still anchored in the skin. 5. Clean the skin around the insertion site with mild soap and warm water. Pat the area dry with a clean cloth. 6. Do not apply creams, ointments, or alcohol to the site. Allow the skin to air dry completely before applying a new dressing. 7. Use a drain sponge (4x4 split gauze) and place the drain through the slit. Cover the drain and the first gauze with a 4x4 gauze. The drain should rest on the gauze and not on the skin. 8. Tape the dressing to the skin. 9. Wash your  hands with soap and water. HOW DO I FLUSH A DRAIN NOT ATTACHED TO A BAG? Biliary drains should be flushed daily unless you are instructed otherwise by a health care provider. The end of the drain is closed using an IV cap to which a syringe can be directly connected.  1. Clean the IV cap with an alcohol swab and then screw the tip of a 10 ml normal saline syringe onto the IV cap. 2. Inject the saline over 5-10 seconds. If you feel resistance while injecting, stop immediately. 3. Remove the syringe from the cap. HOW DO I ATTACH A BAG TO MY DRAIN? If you are having trouble with your biliary drain, you may be directed by your health care provider to use bag drainage until you can be seen to fix the problem. You should always have a drainage bag and connecting tubing at home for this reason. If you do not, remember to ask for these at your next appointment.  1. Remove the bag and connecting tubing from their packaging. 2. Connect the funnel end of the tubing to the bag's cone-shaped stem. 3. Remove the IV cap from the biliary drain by unscrewing it and replace it with the screw-on end of the tubing. 4. Save the IV cap in a sealable plastic storage bag. HOW DO I EMPTY MY DRAINAGE BAG? The drainage bag should be emptied when it becomes 2/3 full or before you go to sleep. Most drainage bags have a drainage  valve at the bottom that allows them to be emptied easily. 1. Hold the bag over the toilet or basin (or measuring container, if you are directed to measure the drainage). 2. Unscrew the valve to open it, and allow the bag to drain. 3. Close the valve securely to avoid leakage, and wipe it clean with a tissue or disposable napkin. SEEK MEDICAL CARE IF:  Your pain gets worse after an initial improvement.  You have any questions about your tube.  Your redness, soreness, or swelling at the tube insertion site gets worse despite good cleaning.  Your skin breaks down around the tube.  You have  leakage of bile around the tube.  Your tube becomes blocked or clogged.  Your catheter is dislodged or comes out.  You have a fever.  You have chills or increased pain. MAKE SURE YOU:  Understand these instructions.  Will watch your condition.  Will get help right away if you are not doing well or get worse. Document Released: 05/06/2013 Document Revised: 07/21/2013 Document Reviewed: 05/06/2013 Exit[employer] Patient Information 2015 [employer]. This information is not intended to replace advice given to you by your health care provider. Make sure you discuss any questions you have with your health care provider.  

## 2015-04-25 NOTE — H&P (Signed)
History of Present Illness: Diana Pearson is a 81 y.o. female with biliary obstruction secondary to tumor S/p right external biliary drain placed 04/09/15. S/p Left internal/external biliary drain placed 04/14/15. She has been scheduled today for cholangiogram and possible internalization of right biliary drain. Patient denies fever/chills/rigors, denies worsening abdominal pain and states she has been feeling well since last procedure. She has not yet seen Dr. Irene Limbo or started and therapy. Her daughter is with her today and interprets today. She states the left sided drain has been working well and output in 24 hr period is approximately 700 ml. Right biliary drain with 78ml output in 24 hrs.    Past Medical History  Diagnosis Date  . Hypothyroidism   . Osteoporosis   . Atrial fibrillation     no anticoagulation.   . Jaundice 04/06/2015    Sudden onset, 10 days following initiation of Zepatier for Hep C.   . Abdominal distention 04/06/2015  . Aortic stenosis 10/2014    by echo.   . Hypercholesterolemia   . History of blood transfusion ~ 2006    "related to bleeding stomach ulcer"  . Bleeding stomach ulcer ~ 2006  . Hepatitis C ~ 2006    needle stick in 1970s    Past Surgical History  Procedure Laterality Date  . Hip fracture surgery Right 2012  . Fracture surgery    . Cataract extraction w/ intraocular lens  implant, bilateral Bilateral 1990's    Allergies: Review of patient's allergies indicates no known allergies.  Medications: Prior to Admission medications   Medication Sig Start Date End Date Taking? Authorizing Shanikia Kernodle  levothyroxine (SYNTHROID, LEVOTHROID) 88 MCG tablet Take 1 tablet (88 mcg total) by mouth daily before breakfast. 04/15/15  Yes Albertine Patricia, MD  metoprolol tartrate (LOPRESSOR) 25 MG tablet Take 0.5 tablets (12.5 mg total) by mouth 2 (two) times daily. 04/15/15  Yes Silver Huguenin Elgergawy, MD  ondansetron (ZOFRAN) 4 MG tablet Take 1 tablet (4 mg total) by mouth  every 6 (six) hours as needed for nausea. 04/15/15  Yes Albertine Patricia, MD  OVER THE COUNTER MEDICATION Take 1 capsule by mouth 4 (four) times daily. Biofilm Detox   Yes Historical Erek Kowal, MD  OVER THE COUNTER MEDICATION Take 1 capsule by mouth 4 (four) times daily. Homeopathic supplement   Yes Historical Cordon Gassett, MD  OVER THE COUNTER MEDICATION Take 1 capsule by mouth 2 (two) times daily. ATP Max   Yes Historical Delara Shepheard, MD  OVER THE COUNTER MEDICATION Take 10 drops by mouth daily. Chelidonium   Yes Historical Mela Perham, MD  oxyCODONE (ROXICODONE) 5 MG immediate release tablet Take 1 tablet (5 mg total) by mouth every 6 (six) hours as needed for severe pain. 04/15/15  Yes Silver Huguenin Elgergawy, MD  feeding supplement, ENSURE ENLIVE, (ENSURE ENLIVE) LIQD Take 237 mLs by mouth 2 (two) times daily between meals. Patient not taking: Reported on 04/22/2015 04/15/15   Albertine Patricia, MD     Family History  Problem Relation Age of Onset  . Stroke Mother     Social History   Social History  . Marital Status: Widowed    Spouse Name: N/A  . Number of Children: N/A  . Years of Education: N/A   Social History Main Topics  . Smoking status: Former Smoker -- 0.12 packs/day for 10 years    Types: Cigarettes  . Smokeless tobacco: Never Used     Comment: "quit smoking in the 1990's"  . Alcohol Use:  No  . Drug Use: No  . Sexual Activity: No   Other Topics Concern  . None   Social History Narrative    Review of Systems: A 12 point ROS discussed and pertinent positives are indicated in the HPI above.  All other systems are negative.  Review of Systems  Vital Signs: BP 122/55 mmHg  Pulse 65  Temp(Src) 97.4 F (36.3 C)  Resp 20  Ht 4\' 6"  (1.372 m)  Wt 104 lb (47.174 kg)  BMI 25.06 kg/m2  SpO2 100%  Physical Exam  Constitutional: No distress.  Jaundice  Neck: No tracheal deviation present.  Cardiovascular: Normal rate and regular rhythm.  Exam reveals no gallop and no friction  rub.   No murmur heard. Pulmonary/Chest: Effort normal and breath sounds normal.  Abdominal: Soft. Bowel sounds are normal. She exhibits no distension. There is no tenderness.  Right biliary drain intact with ostomy bag around site minimal bilious output in bag, Left biliary drain intact 100 cc light yellow bilious output in bag.   Skin: She is not diaphoretic.    Mallampati Score:  MD Evaluation Airway: WNL Heart: WNL Abdomen: WNL Chest/ Lungs: WNL ASA  Classification: 3 Mallampati/Airway Score: Two  Imaging: Ct Abdomen Pelvis W Wo Contrast  04/08/2015   CLINICAL DATA:  81 year old female with hepatitis-C, jaundice, hypothyroidism and abdominal distention. Possible portal vein thrombosis on recent sonogram.  EXAM: CT ABDOMEN AND PELVIS WITHOUT AND WITH CONTRAST  TECHNIQUE: Multidetector CT imaging of the abdomen and pelvis was performed following the standard protocol before and following the bolus administration of intravenous contrast.  CONTRAST:  186mL OMNIPAQUE IOHEXOL 300 MG/ML  SOLN  COMPARISON:  04/07/2015 abdominal sonogram.  FINDINGS: Lower chest: Cardiomegaly. There is atherosclerosis of the thoracic aorta, the great vessels of the mediastinum and the coronary arteries, including calcified atherosclerotic plaque in the left anterior descending, left circumflex and right coronary arteries. Aortic valvular and mitral annular calcifications are present. Subsegmental dependent atelectasis at both lung bases.  Hepatobiliary: The liver surface is diffusely irregular and there is asymmetric hypertrophy of the lateral left liver lobe, indicating cirrhosis. There is an ill-defined 2.9 x 2.2 cm liver mass within the central segment 8 right liver lobe (series 501/image 49), which demonstrates progressive heterogeneous enhancement. There is marked diffuse intrahepatic biliary ductal dilatation. There is an enhancing tumor thrombus at the main portal vein bifurcation extending into the proximal left  and right portal vein branches. The gallbladder is contracted. The common bile duct is normal caliber (4 mm diameter). There are no additional liver masses.  Pancreas: No pancreatic mass. Ill-defined peripancreatic fluid throughout the length of the pancreas. No focal peripancreatic fluid collection. No main pancreatic duct dilation.  Spleen: Top normal size spleen (12.2 cm craniocaudal splenic length). No splenic mass.  Adrenals/Urinary Tract: Normal adrenals. Simple 1.0 cm posterior interpolar left renal cyst. Simple 1.1 cm exophytic lower left renal cyst. Additional subcentimeter hypodense left renal lesions, too small to characterize. No hydronephrosis. No nephrolithiasis. Visualized ureters are normal caliber. On delayed imaging, there is no urothelial wall thickening and there are no filling defects in the opacified portions of the bilateral collecting systems or visualized proximal ureters.  Stomach/Bowel: Relatively collapsed and grossly normal stomach normal caliber small bowel, with no appreciable small bowel wall thickening normal appendix. Normal large bowel, with no large bowel wall thickening.  Vascular/Lymphatic: Atherosclerotic nonaneurysmal abdominal aorta. Patent splenic, hepatic and renal veins. Enlarged 1.4 cm short axis gastrohepatic ligament lymph node (series 501/ image  60). Mild porta hepatis lymphadenopathy.  Reproductive: Grossly normal uterus. Simple 2.3 cm left adnexal cystic structure (series 501/image 152).  Other: Small volume ascites.  No pneumoperitoneum.  Musculoskeletal: Partially visualized right total hip arthroplasty. Mild T10 and L1 vertebral body compression fractures and severe L2 vertebral body compression fracture of indeterminate chronicity. Mild to moderate degenerative changes in the visualized thoracolumbar spine. No aggressive appearing focal osseous lesions.  IMPRESSION: 1. 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 A cholangiocarcinoma (Kl[place]kin tumor) is favored given the location and biliary ductal dilatation, however hepatocellular carcinoma is on the differential given the underlying cirrhosis and the presence of portal vein tumor thrombus. 2. Small volume ascites.  Top-normal size spleen. 3. Mild porta hepatis and gastrohepatic ligament lymphadenopathy, nonspecific. 4. Mild T10 and L1 and superior L2 vertebral body compression fractures of indeterminate chronicity, likely chronic. 5. Cardiomegaly. Atherosclerosis, including three-vessel coronary artery disease. Please note that although the presence of coronary artery calcium documents the presence of coronary artery disease, the severity of this disease and any potential stenosis cannot be assessed on this non-gated CT examination. 6. Simple benign-appearing 2.3 cm left adnexal cyst. No follow-up is required. This recommendation follows ACR consensus guidelines: White Paper of the ACR Incidental Findings Committee II on Adnexal Findings. J Am Coll Radiol [phone]. These results were called by telephone at the time of interpretation on 04/08/2015 at 8:55 am to Dr. Nicoletta Ba , who verbally acknowledged these results.   Electronically Signed   By: Ilona Sorrel M.D.   On: 04/08/2015 09:06   Ct Chest W Contrast  04/15/2015   CLINICAL DATA:  81 year old with current history of hepatitis-C and cirrhosis, presenting this admission with a central liver mass causing biliary obstruction, hepatocellular carcinoma versus cholangiocarcinoma. Staging CT to evaluate for metastatic disease.  EXAM: CT CHEST WITH CONTRAST  TECHNIQUE: Multidetector CT imaging of the chest was performed during intravenous contrast administration.  CONTRAST:  60 mL OMNIPAQUE IOHEXOL 300 MG/ML IV.  COMPARISON:  None.  FINDINGS: Lungs: No evidence of  pulmonary metastasis. Atelectasis in the lower lobes. No confluent airspace consolidation. No evidence of interstitial lung disease.  Trachea/bronchi: Central airways patent without significant bronchial wall thickening. Calcification of the tracheobronchial cartilages.  Pleura:  No pleural plaques or masses.  No pleural effusions.  Mediastinum:  No mediastinal masses.  Heart and Vascular: Heart markedly enlarged. Moderate to marked left ventricular hypertrophy. Extensive mitral annular calcification. Extensive aortic valvular calcification. Moderate LAD coronary atherosclerosis. No pericardial effusion. Moderate to marked atherosclerosis involving the thoracic aorta without aneurysm or dissection.  Lymphatic: No pathologic mediastinal, hilar or axillary lymphadenopathy.  Other findings: None.  Visualized lower neck: Thyroid gland atrophic. No evidence of supraclavicular lymphadenopathy.  Visualized upper abdomen: Biliary drainage catheters in place in the liver with residual moderate to marked biliary ductal dilation in the posterior segment right lobe. Left lobe bile ducts decompressed. Central hepatic mass as detailed on the prior CT abdomen and pelvis examination 1 week ago.  Musculoskeletal: Osseous demineralization with a mild compression fracture of the upper endplate of M62 which does not appear acute and is felt to be due to osteoporosis. No evidence of osseous metastatic disease.  IMPRESSION: 1. No evidence of metastatic disease in the thorax. 2. Marked cardiomegaly with moderate to marked left ventricular hypertrophy, extensive mitral annular calcification and  extensive aortic valvular calcification. 3. Biliary drainage catheters in place in the visualized liver with residual biliary ductal dilation in the posterior segment of the right lobe. The left lobe bile ducts are decompressed. 4. Central hepatic mass as detailed on the prior CT abdomen pelvis examination 1 week ago.   Electronically Signed   By:  Evangeline Dakin M.D.   On: 04/15/2015 07:55   US Abdomen Complete  04/06/2015   CLINICAL DATA:  Acute liver failure.  Jaundice.  EXAM: ULTRASOUND ABDOMEN COMPLETE  COMPARISON:  None.  FINDINGS: Gallbladder: Gallbladder is not visualized and may be surgically absent or contracted.  Common bile duct: Diameter: 5.3 mm, normal  Liver: Coarsened parenchymal echotexture with nodular contour suggesting hepatic cirrhosis. Prominence of portal veins with flow in the appropriate direction. Suggestion of mild intrahepatic bile duct dilatation. No focal liver lesions identified.  IVC: No abnormality visualized.  Pancreas: Visualized portion unremarkable.  Spleen: Spleen appears enlarged with homogeneous parenchymal echotexture.  Right Kidney: Length: 10.3 cm. Echogenicity within normal limits. No mass or hydronephrosis visualized.  Left Kidney: Length: 9.6 cm. Small cyst demonstrated in the lower pole measuring up to about 1 cm diameter. No hydronephrosis.  Abdominal aorta: No aneurysm visualized.  Other findings: None.  IMPRESSION: Coarsened liver echotexture with nodular contour and prominent pulmonary venous structures consistent with hepatic cirrhosis. No focal lesions identified. Suggestion of intrahepatic bile duct dilatation without extrahepatic ductal dilatation. Gallbladder not visualized. Probable splenic enlargement.   Electronically Signed   By: Lucienne Capers M.D.   On: 04/06/2015 23:51   [place] Art/ven Flow Abd Pelv Doppler  04/07/2015   CLINICAL DATA:  Jaundice, hepatic cirrhosis.  EXAM: DUPLEX ULTRASOUND OF LIVER  TECHNIQUE: Color and duplex Doppler ultrasound was performed to evaluate the hepatic in-flow and out-flow vessels. Exam is significantly limited as patient does not speak English and unable to follow breathing instructions.  COMPARISON:  None.  FINDINGS: Portal Vein Velocities  Main: 18.1 cm/sec proximally ; hepatopetal flow is noted proximally and in the midportion, but no flow is noted distally  consistent with portal venous thrombus.  Right:  Not visualized due to limitations described above.  Left:  Not visualized due to limitations described above.  Hepatic Vein Velocities  Right:  20.5 cm/sec ; hepatofugal flow is noted.  Middle:  Not visualized due to limitations described above.  Left:  29.7 Cm/sec ; hepatofugal flow is noted.  Hepatic Artery Velocity:  146 cm/sec  Splenic Vein Velocity:  17.1 cm/sec  Varices: Present.  Ascites: Present.  IMPRESSION: Exam is significantly limited as patient does not speak English and unable to follow breathing instructions. There is probable occlusive thrombus seen in the distal portal vein. Middle hepatic vein is not visualized due to previously described limitations, but right and left hepatic veins demonstrate normal flow and velocity. These results will be called to the ordering clinician or representative by the Radiologist Assistant, and communication documented in the PACS or zVision Dashboard.   Electronically Signed   By: Marijo Conception, M.D.   On: 04/07/2015 08:12   Ir Biliary Drain Placement With Cholangiogram  04/14/2015   CLINICAL DATA:  Biliary obstruction.  Klatskin tumor.  EXAM: IR CONVERT BILIARY DRAIN TO INT-EXT BILARY DRAIN; IR BILIARY DRAIN PLACEMENT W/ CHOLANGIOGRAM  FLUOROSCOPY TIME:  22 minutes and 54 seconds  MEDICATIONS AND MEDICAL HISTORY: Versed 2 mg, Fentanyl 100 mcg.  Additional Medications: 3.75 g of Zosyn.  ANESTHESIA/SEDATION: Moderate sedation time: 55 minutes  CONTRAST:  40  cc Omnipaque 300  PROCEDURE: The procedure, risks, benefits, and alternatives were explained to the patient. Questions regarding the procedure were encouraged and answered. The patient understands and consents to the procedure.  The abdomen and right flank were prepped with Betadine in a sterile fashion, and a sterile drape was applied covering the operative field. A sterile gown and sterile gloves were used for the procedure.  The right biliary drain was  exchanged over a Bentson wire for an 8 [place] sheath. Sheath cholangiogram was performed. A Kumpe the catheter was advanced over the Bentson wire to the central biliary tree. Exhaustive attempts were made to advance the catheter across the obstruction into the common bile duct. Wires utilized included roadrunner an glide wires. Catheters utilized included a copy catheter, cobra catheter, and Sos Omni catheter.  Under sonographic guidance, a 21 gauge needle was inserted into a peripheral left duct. This was removed over a 018 wire which was up sized to a 3 J. a Kumpe the catheter was easily advanced over the J-wire, across the obstruction and into the common bile duct. This was advanced over a glidewire into the duodenum. It was exchanged over an Amplatz wire for a 10 [place] biliary drain. Several additional sideholes were cut above the marker. The right biliary sheath was exchanged for a [place] strain. This was advanced in the central biliary tree. Contrast was injected into both catheters. A sample was sent for cytology.  FINDINGS: Sheath cholangiography confirms central biliary obstruction.  Subsequent images demonstrate placement of a left internal external biliary drain with its tip in the duodenum. The right external biliary drain is coiled in the central biliary tree.  COMPLICATIONS: None  IMPRESSION: Successful left internal external biliary drain with passage across the obstruction. The right biliary drain could not be passed across the obstruction secondary to suboptimal geometry. The patient will return next week for an additional right biliary drain.   Electronically Signed   By: Marybelle Killings M.D.   On: 04/14/2015 18:15   Ir Biliary Drain Placement With Cholangiogram  04/09/2015   CLINICAL DATA:  81 year old female presents with jaundice, hyperbilirubinemia, and imaging diagnosis of hilar tumor in the liver. CT demonstrates intrahepatic biliary ductal dilatation.  She has been referred  for percutaneous transhepatic cholangiogram and drainage.  EXAM: IR BILIARY DRAIN PLACEMENT W/ CHOLANGIOGRAM; IR ENDOLUMNICAL BIOPSY OF BILIARY TREE  COMPARISON:  CT 04/07/2015  ANESTHESIA/SEDATION: 4.0 IV Versed; 2 high mcg IV Fentanyl.  Total Moderate Sedation Time: 45 minutes.  CONTRAST:  37mL OMNIPAQUE IOHEXOL 300 MG/ML  SOLN  MEDICATIONS: Receiving Zosyn  Procedural  FLUOROSCOPY TIME:  18 minutes 36 second  TECHNIQUE: The procedure, risks, benefits, and alternatives were explained to the patient and the patient's family. A complete informed consent was performed, with risk benefit analysis. Specific risks that were discussed for the procedure include bleeding, infection, biliary sepsis, IC use day, organ injury, need for further procedure, need for further surgery, long-term drain placement, cardiopulmonary collapse, death. Questions regarding the procedure were encouraged and answered. The patient understands and consents to the procedure.  Patient is position in supine position on the fluoroscopy table, and the upper abdomen was prepped and draped in the usual sterile fashion. Maximum barrier sterile technique with sterile gowns and gloves were used for the procedure. A timeout was performed prior to the initiation of the procedure. Local anesthesia was provided with 1% lidocaine with epinephrine.  Ultrasound survey of the right liver lobe was performed. Window into the  right biliary system was present for the right liver approach.  1% lidocaine was used for local anesthesia, with generous infiltration of the skin and subcutaneous tissues in and inter left costal location. A Chiba needle was advanced under ultrasound guidance into the right liver lobe.  Once the tip of the needle was confirmed within the biliary system by injecting small aliquots of contrast, images were stored of the biliary system after partially opacifying the biliary tree via the needle.  Once the tip of this needle was confirmed within  the biliary system, an 018 wire was advanced centrally. The needle was removed, a small incision was made with an 11 blade scalpel.  An Accustick system would not advance into the biliary system, and [place] a micropuncture system was advanced over the 018 wire into the biliary tree. The inner dilator and wire were removed, and an 035 catheter was advanced into the biliary system. A standard 6 French sheath was then placed in the biliary system.  Cholangiogram was then performed.  A combination of Glidewire, 4 [place] glide cath, 5 [place] cobra catheter, as well as a 6 [place] beacon tip 35 cm sheath used to attempt to navigate through the obstruction with no success.  A standard pigtail 10 French drainage catheter was placed over the [employer].  We confirmed placement with contrast infusion.  The patient tolerated the procedure well and remained hemodynamically stable throughout.  No complications were encountered and no significant blood loss was encountered.  COMPLICATIONS: None  FINDINGS: Ultrasound survey demonstrates dilated intrahepatic biliary ducts. Approach into the right liver lobe was identified.  Percutaneous transhepatic cholangiogram demonstrates moderate to severely dilated intrahepatic biliary ducts. There is communication of the left and right-sided ducts, and the os a right and left drain may not be required for drainage.  Dilated ducts of the segment 4B should not be confused for common hepatic duct. Common hepatic duct appears to be closer to the patient's right side.  Small amount of contrast did opacified I the common hepatic duct and traversed the common bile duct into the duodenum. Cystic duct opacified with incomplete filling of the gallbladder.  Unable to traverse the obstruction from our initial puncture.  IMPRESSION: Status post percutaneous transhepatic cholangiogram with demonstration of high-grade stenosis of the common hepatic duct, and subsequent placement of externalized standard 10  French pigtail catheter drain.  Communication of left and right-sided ducts was demonstrated.  Signed,  Dulcy Fanny. Earleen Newport, DO  [facility]  [facility]  PLAN: Would recommend repeat through the tube cholangiogram on Monday or Tuesday of the upcoming week for drainage catheter exchange and a second attempt at traversing the obstruction.  Twice a day 10 cc saline flushes until drain exchange.   Electronically Signed   By: Corrie Mckusick D.O.   On: 04/09/2015 13:51   Ir Convert Biliary Drain To Int Ext Biliary Drain  04/14/2015   CLINICAL DATA:  Biliary obstruction.  [place]kin tumor.  EXAM: IR CONVERT BILIARY DRAIN TO INT-EXT BILARY DRAIN; IR BILIARY DRAIN PLACEMENT W/ CHOLANGIOGRAM  FLUOROSCOPY TIME:  22 minutes and 54 seconds  MEDICATIONS AND MEDICAL HISTORY: Versed 2 mg, Fentanyl 100 mcg.  Additional Medications: 3.75 g of Zosyn.  ANESTHESIA/SEDATION: Moderate sedation time: 55 minutes  CONTRAST:  40 cc Omnipaque 300  PROCEDURE: The procedure, risks, benefits, and alternatives were explained to the patient. Questions regarding the procedure were encouraged and answered. The patient understands and consents to the procedure.  The abdomen and  right flank were prepped with Betadine in a sterile fashion, and a sterile drape was applied covering the operative field. A sterile gown and sterile gloves were used for the procedure.  The right biliary drain was exchanged over a Bentson wire for an 8 [place] sheath. Sheath cholangiogram was performed. A Kumpe the catheter was advanced over the Bentson wire to the central biliary tree. Exhaustive attempts were made to advance the catheter across the obstruction into the common bile duct. Wires utilized included roadrunner an glide wires. Catheters utilized included a copy catheter, cobra catheter, and Sos Omni catheter.  Under sonographic guidance, a 21 gauge needle was inserted into a peripheral left duct. This was removed over a  018 wire which was up sized to a 3 J. a Kumpe the catheter was easily advanced over the J-wire, across the obstruction and into the common bile duct. This was advanced over a glidewire into the duodenum. It was exchanged over an Amplatz wire for a 10 [place] biliary drain. Several additional sideholes were cut above the marker. The right biliary sheath was exchanged for a [place] strain. This was advanced in the central biliary tree. Contrast was injected into both catheters. A sample was sent for cytology.  FINDINGS: Sheath cholangiography confirms central biliary obstruction.  Subsequent images demonstrate placement of a left internal external biliary drain with its tip in the duodenum. The right external biliary drain is coiled in the central biliary tree.  COMPLICATIONS: None  IMPRESSION: Successful left internal external biliary drain with passage across the obstruction. The right biliary drain could not be passed across the obstruction secondary to suboptimal geometry. The patient will return next week for an additional right biliary drain.   Electronically Signed   By: Marybelle Killings M.D.   On: 04/14/2015 18:15   Ir Endoluminal Bx Of Biliary Tree  04/09/2015   CLINICAL DATA:  81 year old female presents with jaundice, hyperbilirubinemia, and imaging diagnosis of hilar tumor in the liver. CT demonstrates intrahepatic biliary ductal dilatation.  She has been referred for percutaneous transhepatic cholangiogram and drainage.  EXAM: IR BILIARY DRAIN PLACEMENT W/ CHOLANGIOGRAM; IR ENDOLUMNICAL BIOPSY OF BILIARY TREE  COMPARISON:  CT 04/07/2015  ANESTHESIA/SEDATION: 4.0 IV Versed; 2 high mcg IV Fentanyl.  Total Moderate Sedation Time: 45 minutes.  CONTRAST:  78mL OMNIPAQUE IOHEXOL 300 MG/ML  SOLN  MEDICATIONS: Receiving Zosyn  Procedural  FLUOROSCOPY TIME:  18 minutes 36 second  TECHNIQUE: The procedure, risks, benefits, and alternatives were explained to the patient and the patient's family. A  complete informed consent was performed, with risk benefit analysis. Specific risks that were discussed for the procedure include bleeding, infection, biliary sepsis, IC use day, organ injury, need for further procedure, need for further surgery, long-term drain placement, cardiopulmonary collapse, death. Questions regarding the procedure were encouraged and answered. The patient understands and consents to the procedure.  Patient is position in supine position on the fluoroscopy table, and the upper abdomen was prepped and draped in the usual sterile fashion. Maximum barrier sterile technique with sterile gowns and gloves were used for the procedure. A timeout was performed prior to the initiation of the procedure. Local anesthesia was provided with 1% lidocaine with epinephrine.  Ultrasound survey of the right liver lobe was performed. Window into the right biliary system was present for the right liver approach.  1% lidocaine was used for local anesthesia, with generous infiltration of the skin and subcutaneous tissues in and inter left costal location. A Chiba needle  was advanced under ultrasound guidance into the right liver lobe.  Once the tip of the needle was confirmed within the biliary system by injecting small aliquots of contrast, images were stored of the biliary system after partially opacifying the biliary tree via the needle.  Once the tip of this needle was confirmed within the biliary system, an 018 wire was advanced centrally. The needle was removed, a small incision was made with an 11 blade scalpel.  An Accustick system would not advance into the biliary system, and [place] a micropuncture system was advanced over the 018 wire into the biliary tree. The inner dilator and wire were removed, and an 035 catheter was advanced into the biliary system. A standard 6 French sheath was then placed in the biliary system.  Cholangiogram was then performed.  A combination of Glidewire, 4 [place] glide cath, 5  [place] cobra catheter, as well as a 6 [place] beacon tip 35 cm sheath used to attempt to navigate through the obstruction with no success.  A standard pigtail 10 French drainage catheter was placed over the [employer].  We confirmed placement with contrast infusion.  The patient tolerated the procedure well and remained hemodynamically stable throughout.  No complications were encountered and no significant blood loss was encountered.  COMPLICATIONS: None  FINDINGS: Ultrasound survey demonstrates dilated intrahepatic biliary ducts. Approach into the right liver lobe was identified.  Percutaneous transhepatic cholangiogram demonstrates moderate to severely dilated intrahepatic biliary ducts. There is communication of the left and right-sided ducts, and the os a right and left drain may not be required for drainage.  Dilated ducts of the segment 4B should not be confused for common hepatic duct. Common hepatic duct appears to be closer to the patient's right side.  Small amount of contrast did opacified I the common hepatic duct and traversed the common bile duct into the duodenum. Cystic duct opacified with incomplete filling of the gallbladder.  Unable to traverse the obstruction from our initial puncture.  IMPRESSION: Status post percutaneous transhepatic cholangiogram with demonstration of high-grade stenosis of the common hepatic duct, and subsequent placement of externalized standard 10 French pigtail catheter drain.  Communication of left and right-sided ducts was demonstrated.  Signed,  Dulcy Fanny. Earleen Newport, DO  [facility]  [facility]  PLAN: Would recommend repeat through the tube cholangiogram on Monday or Tuesday of the upcoming week for drainage catheter exchange and a second attempt at traversing the obstruction.  Twice a day 10 cc saline flushes until drain exchange.   Electronically Signed   By: Corrie Mckusick D.O.   On: 04/09/2015 13:51     Labs:  CBC:  Recent Labs  04/12/15 0546 04/13/15 0420 04/14/15 0519 04/25/15 0953  WBC 7.0 7.3 9.0 12.4*  HGB 12.7 13.0 13.9 14.8  HCT 36.0 37.0 40.8 41.1  PLT 163 181 208 174    COAGS:  Recent Labs  04/06/15 1405  04/07/15 0523 04/08/15 0720 04/08/15 1316 04/12/15 0546 04/25/15 0953  INR 1.44  < > 1.47 1.59*  --  1.46 1.65*  APTT 30  --   --   --  34 33  --   < > = values in this interval not displayed.  BMP:  Recent Labs  04/13/15 0420 04/14/15 0519 04/15/15 0530 04/25/15 0953  NA 135 135 134* 131*  K 4.3 5.0 4.4 4.9  CL 102 102 104 104  CO2 25 23 22  18*  GLUCOSE 82 94 93 124*  BUN  13 18 17  32*  CALCIUM 7.9* 8.0* 7.4* 8.8*  CREATININE 0.79 1.15* 0.80 1.21*  GFRNONAA >60 43* >60 40*  GFRAA >60 50* >60 47*    LIVER FUNCTION TESTS:  Recent Labs  04/13/15 0420 04/14/15 0519 04/15/15 0530 04/25/15 0953  BILITOT 13.8* 15.8* 14.1* 23.9*  AST 65* 80* 72* 83*  ALT 39 41 35 35  ALKPHOS 158* 175* 143* 161*  PROT 6.0* 6.8 5.9* 6.7  ALBUMIN 1.9* 2.2* 1.8* 2.1*    TUMOR MARKERS:  Recent Labs  04/07/15 1615 04/15/15 0530 04/15/15 1834  AFPTM 46.6*  --   --   CEA  --   --  5.4*  CA199  --  734*  --     Assessment and Plan: Biliary obstruction secondary to tumor S/p right external biliary drain placed 04/09/15 S/p Left internal/external biliary drain placed 04/14/15 Wbc up today 12.4 (9.0), Total bilirubin up 23.9 (14.1), afebrile, has not started chemotherapy- Patient denies fever/chills/rigors, denies worsening abdominal pain Scheduled today for possible internalization of right biliary drain however with new labs today will perform cholangiogram and possible exchange is all today The patient has been NPO, no blood thinners taken, labs and vitals have been reviewed. Risks and Benefits discussed with the patient and her daughter including, but not limited to bleeding, infection which may lead to sepsis All questions were answered, patient  and her daughter are agreeable to proceed. Consent signed and in chart.   SignedHedy Jacob 04/25/2015, 11:04 AM

## 2015-04-26 ENCOUNTER — Other Ambulatory Visit: Payer: Self-pay | Admitting: *Deleted

## 2015-04-26 ENCOUNTER — Ambulatory Visit: Payer: No Typology Code available for payment source | Admitting: Hematology

## 2015-04-26 ENCOUNTER — Other Ambulatory Visit (HOSPITAL_COMMUNITY): Payer: Self-pay | Admitting: Interventional Radiology

## 2015-04-26 ENCOUNTER — Telehealth: Payer: Self-pay | Admitting: Hematology

## 2015-04-26 ENCOUNTER — Other Ambulatory Visit: Payer: No Typology Code available for payment source

## 2015-04-26 DIAGNOSIS — C240 Malignant neoplasm of extrahepatic bile duct: Secondary | ICD-10-CM

## 2015-04-26 NOTE — Telephone Encounter (Signed)
Received message from dtr yesterday re delay with IR and not being able to have pet. S/w desk nurse and pet r/s to 10/6 and f/u to 10//7. Spoke with patient dtr she is aware of both pet 10/6 and lab/fu 10/7.

## 2015-04-28 ENCOUNTER — Ambulatory Visit: Payer: No Typology Code available for payment source | Admitting: Family Medicine

## 2015-05-02 ENCOUNTER — Encounter (HOSPITAL_COMMUNITY): Payer: Self-pay | Admitting: *Deleted

## 2015-05-02 ENCOUNTER — Emergency Department (HOSPITAL_COMMUNITY): Payer: Medicaid Other

## 2015-05-02 ENCOUNTER — Inpatient Hospital Stay (HOSPITAL_COMMUNITY)
Admission: EM | Admit: 2015-05-02 | Discharge: 2015-05-31 | DRG: 682 | Disposition: E | Payer: Medicaid Other | Attending: Internal Medicine | Admitting: Internal Medicine

## 2015-05-02 DIAGNOSIS — R0902 Hypoxemia: Secondary | ICD-10-CM | POA: Diagnosis not present

## 2015-05-02 DIAGNOSIS — Z79891 Long term (current) use of opiate analgesic: Secondary | ICD-10-CM

## 2015-05-02 DIAGNOSIS — E860 Dehydration: Secondary | ICD-10-CM | POA: Diagnosis present

## 2015-05-02 DIAGNOSIS — K7290 Hepatic failure, unspecified without coma: Secondary | ICD-10-CM | POA: Diagnosis present

## 2015-05-02 DIAGNOSIS — N19 Unspecified kidney failure: Secondary | ICD-10-CM

## 2015-05-02 DIAGNOSIS — M810 Age-related osteoporosis without current pathological fracture: Secondary | ICD-10-CM | POA: Diagnosis present

## 2015-05-02 DIAGNOSIS — Z823 Family history of stroke: Secondary | ICD-10-CM

## 2015-05-02 DIAGNOSIS — Z8711 Personal history of peptic ulcer disease: Secondary | ICD-10-CM

## 2015-05-02 DIAGNOSIS — Z9841 Cataract extraction status, right eye: Secondary | ICD-10-CM

## 2015-05-02 DIAGNOSIS — R531 Weakness: Secondary | ICD-10-CM | POA: Insufficient documentation

## 2015-05-02 DIAGNOSIS — Z961 Presence of intraocular lens: Secondary | ICD-10-CM | POA: Diagnosis present

## 2015-05-02 DIAGNOSIS — T730XXA Starvation, initial encounter: Secondary | ICD-10-CM | POA: Diagnosis present

## 2015-05-02 DIAGNOSIS — G9340 Encephalopathy, unspecified: Secondary | ICD-10-CM | POA: Diagnosis present

## 2015-05-02 DIAGNOSIS — E039 Hypothyroidism, unspecified: Secondary | ICD-10-CM | POA: Diagnosis present

## 2015-05-02 DIAGNOSIS — R64 Cachexia: Secondary | ICD-10-CM | POA: Diagnosis not present

## 2015-05-02 DIAGNOSIS — E872 Acidosis, unspecified: Secondary | ICD-10-CM | POA: Insufficient documentation

## 2015-05-02 DIAGNOSIS — Z79899 Other long term (current) drug therapy: Secondary | ICD-10-CM | POA: Diagnosis not present

## 2015-05-02 DIAGNOSIS — B182 Chronic viral hepatitis C: Secondary | ICD-10-CM | POA: Diagnosis present

## 2015-05-02 DIAGNOSIS — N179 Acute kidney failure, unspecified: Principal | ICD-10-CM | POA: Diagnosis present

## 2015-05-02 DIAGNOSIS — E875 Hyperkalemia: Secondary | ICD-10-CM | POA: Diagnosis present

## 2015-05-02 DIAGNOSIS — Z6824 Body mass index (BMI) 24.0-24.9, adult: Secondary | ICD-10-CM

## 2015-05-02 DIAGNOSIS — I350 Nonrheumatic aortic (valve) stenosis: Secondary | ICD-10-CM | POA: Diagnosis present

## 2015-05-02 DIAGNOSIS — D689 Coagulation defect, unspecified: Secondary | ICD-10-CM | POA: Diagnosis present

## 2015-05-02 DIAGNOSIS — Z9842 Cataract extraction status, left eye: Secondary | ICD-10-CM | POA: Diagnosis not present

## 2015-05-02 DIAGNOSIS — I482 Chronic atrial fibrillation, unspecified: Secondary | ICD-10-CM | POA: Diagnosis present

## 2015-05-02 DIAGNOSIS — E7800 Pure hypercholesterolemia, unspecified: Secondary | ICD-10-CM | POA: Diagnosis present

## 2015-05-02 DIAGNOSIS — Z66 Do not resuscitate: Secondary | ICD-10-CM | POA: Diagnosis present

## 2015-05-02 DIAGNOSIS — Z87891 Personal history of nicotine dependence: Secondary | ICD-10-CM

## 2015-05-02 DIAGNOSIS — E43 Unspecified severe protein-calorie malnutrition: Secondary | ICD-10-CM | POA: Diagnosis present

## 2015-05-02 DIAGNOSIS — R160 Hepatomegaly, not elsewhere classified: Secondary | ICD-10-CM | POA: Diagnosis present

## 2015-05-02 DIAGNOSIS — C221 Intrahepatic bile duct carcinoma: Secondary | ICD-10-CM | POA: Diagnosis present

## 2015-05-02 DIAGNOSIS — E162 Hypoglycemia, unspecified: Secondary | ICD-10-CM | POA: Diagnosis not present

## 2015-05-02 DIAGNOSIS — K831 Obstruction of bile duct: Secondary | ICD-10-CM | POA: Insufficient documentation

## 2015-05-02 DIAGNOSIS — X58XXXA Exposure to other specified factors, initial encounter: Secondary | ICD-10-CM | POA: Diagnosis present

## 2015-05-02 DIAGNOSIS — I959 Hypotension, unspecified: Secondary | ICD-10-CM | POA: Diagnosis not present

## 2015-05-02 DIAGNOSIS — R17 Unspecified jaundice: Secondary | ICD-10-CM | POA: Diagnosis present

## 2015-05-02 DIAGNOSIS — Z515 Encounter for palliative care: Secondary | ICD-10-CM

## 2015-05-02 DIAGNOSIS — K767 Hepatorenal syndrome: Secondary | ICD-10-CM | POA: Diagnosis present

## 2015-05-02 DIAGNOSIS — R109 Unspecified abdominal pain: Secondary | ICD-10-CM | POA: Insufficient documentation

## 2015-05-02 DIAGNOSIS — E785 Hyperlipidemia, unspecified: Secondary | ICD-10-CM | POA: Diagnosis present

## 2015-05-02 HISTORY — DX: Malignant (primary) neoplasm, unspecified: C80.1

## 2015-05-02 LAB — URINALYSIS, ROUTINE W REFLEX MICROSCOPIC
GLUCOSE, UA: NEGATIVE mg/dL
Ketones, ur: NEGATIVE mg/dL
Nitrite: POSITIVE — AB
Protein, ur: 30 mg/dL — AB
SPECIFIC GRAVITY, URINE: 1.019 (ref 1.005–1.030)
Urobilinogen, UA: 1 mg/dL (ref 0.0–1.0)
pH: 5 (ref 5.0–8.0)

## 2015-05-02 LAB — URINE MICROSCOPIC-ADD ON

## 2015-05-02 LAB — COMPREHENSIVE METABOLIC PANEL
ALBUMIN: 2.5 g/dL — AB (ref 3.5–5.0)
ALK PHOS: 210 U/L — AB (ref 38–126)
ALT: 48 U/L (ref 14–54)
ANION GAP: 20 — AB (ref 5–15)
AST: 107 U/L — AB (ref 15–41)
BUN: 132 mg/dL — AB (ref 6–20)
CALCIUM: 7.8 mg/dL — AB (ref 8.9–10.3)
CO2: 9 mmol/L — AB (ref 22–32)
Chloride: 103 mmol/L (ref 101–111)
Creatinine, Ser: 3.85 mg/dL — ABNORMAL HIGH (ref 0.44–1.00)
GFR calc Af Amer: 12 mL/min — ABNORMAL LOW (ref >60)
GFR calc non Af Amer: 10 mL/min — ABNORMAL LOW (ref >60)
GLUCOSE: 144 mg/dL — AB (ref 65–99)
Potassium: 6.4 mmol/L (ref 3.5–5.1)
SODIUM: 132 mmol/L — AB (ref 135–145)
Total Bilirubin: 26.6 mg/dL (ref 0.3–1.2)
Total Protein: 6.9 g/dL (ref 6.5–8.1)

## 2015-05-02 LAB — CBC
HEMATOCRIT: 44.4 % (ref 36.0–46.0)
HEMOGLOBIN: 16 g/dL — AB (ref 12.0–15.0)
MCH: 31.8 pg (ref 26.0–34.0)
MCHC: 36 g/dL (ref 30.0–36.0)
MCV: 88.3 fL (ref 78.0–100.0)
Platelets: 232 10*3/uL (ref 150–400)
RBC: 5.03 MIL/uL (ref 3.87–5.11)
RDW: 21.2 % — AB (ref 11.5–15.5)
WBC: 14.6 10*3/uL — ABNORMAL HIGH (ref 4.0–10.5)

## 2015-05-02 LAB — POTASSIUM: Potassium: 6.7 mmol/L (ref 3.5–5.1)

## 2015-05-02 LAB — LIPASE, BLOOD: Lipase: 56 U/L — ABNORMAL HIGH (ref 22–51)

## 2015-05-02 MED ORDER — SODIUM BICARBONATE 8.4 % IV SOLN
25.0000 meq | Freq: Once | INTRAVENOUS | Status: AC
  Administered 2015-05-02: 25 meq via INTRAVENOUS
  Filled 2015-05-02: qty 50

## 2015-05-02 MED ORDER — ONDANSETRON HCL 4 MG/2ML IJ SOLN
4.0000 mg | Freq: Once | INTRAMUSCULAR | Status: AC
  Administered 2015-05-02: 4 mg via INTRAVENOUS
  Filled 2015-05-02: qty 2

## 2015-05-02 MED ORDER — SODIUM CHLORIDE 0.9 % IV BOLUS (SEPSIS)
500.0000 mL | Freq: Once | INTRAVENOUS | Status: AC
Start: 2015-05-02 — End: 2015-05-02
  Administered 2015-05-02: 500 mL via INTRAVENOUS

## 2015-05-02 MED ORDER — ONDANSETRON HCL 4 MG/2ML IJ SOLN: 4.0000 mg | Freq: Three times a day (TID) | INTRAMUSCULAR | Status: DC | PRN

## 2015-05-02 MED ORDER — SODIUM CHLORIDE 0.9 % IV SOLN: INTRAVENOUS | Status: DC

## 2015-05-02 MED ORDER — FENTANYL CITRATE (PF) 100 MCG/2ML IJ SOLN
25.0000 ug | Freq: Once | INTRAMUSCULAR | Status: AC
  Administered 2015-05-02: 25 ug via INTRAVENOUS
  Filled 2015-05-02: qty 2

## 2015-05-02 MED ORDER — INSULIN ASPART 100 UNIT/ML ~~LOC~~ SOLN
10.0000 [IU] | Freq: Once | SUBCUTANEOUS | Status: AC
  Administered 2015-05-02: 10 [IU] via INTRAVENOUS
  Filled 2015-05-02: qty 1

## 2015-05-02 MED ORDER — SODIUM CHLORIDE 0.9 % IV BOLUS (SEPSIS)
1000.0000 mL | Freq: Once | INTRAVENOUS | Status: AC
  Administered 2015-05-02: 1000 mL via INTRAVENOUS

## 2015-05-02 MED ORDER — DEXTROSE 50 % IV SOLN
1.0000 | Freq: Once | INTRAVENOUS | Status: AC
  Administered 2015-05-02: 50 mL via INTRAVENOUS
  Filled 2015-05-02: qty 50

## 2015-05-02 MED ORDER — SODIUM POLYSTYRENE SULFONATE 15 GM/60ML PO SUSP
30.0000 g | Freq: Once | ORAL | Status: AC
  Administered 2015-05-02: 30 g via ORAL
  Filled 2015-05-02: qty 120

## 2015-05-02 MED ORDER — CALCIUM GLUCONATE 10 % IV SOLN
1.0000 g | Freq: Once | INTRAVENOUS | Status: AC
  Administered 2015-05-02: 1 g via INTRAVENOUS
  Filled 2015-05-02: qty 10

## 2015-05-02 NOTE — ED Notes (Signed)
Patient transported to CT 

## 2015-05-02 NOTE — ED Notes (Signed)
Pt arrives via ems (pt speaks arabic). Pt with recent dx of liver cancer, reports fluid in her illeostomy is darker than it has been, burning pain in her abdomen, lethargy (pt family reports MS is "normal" for the pt). IV established en route, bp 144/70, hr 74, r 16, nsr.

## 2015-05-02 NOTE — ED Notes (Addendum)
Primary RN, Marisa Severin. And Dr.Miller made aware of critical T. bilirubin

## 2015-05-02 NOTE — ED Notes (Signed)
Pt has a hepatobiliary drain, pt family concerned that since last night, the drainage has become a dark color, with some drainage at the insertion site. Family has been changing the dressing and applying antibiotic cream with dressing changes. For three days has been increasingly weak.

## 2015-05-02 NOTE — ED Notes (Signed)
Ct notified no oral contrast, only iv

## 2015-05-02 NOTE — ED Notes (Signed)
EDP made aware the pt is hoarding the kay exelate in her mouth and not swallowing it.

## 2015-05-02 NOTE — Progress Notes (Signed)
Patient listed as not having insurance or a pcp.  EDCM spoke to patient's family member at bedside.  Patient's family member reports patient has the orange card and presented it to [facility].  EDCM noted patient's pcp to be located at [place].  EDCM provided patient's family member with phone number and address to [facility].  Patient's family member thankful for services.  No further EDCM needs at this time.

## 2015-05-02 NOTE — ED Provider Notes (Signed)
CSN: [id number]     Arrival date & time 05/21/2015  1907 History   First MD Initiated Contact with Patient 05/19/2015 1915     Chief Complaint  Patient presents with  . Abdominal Pain     (Consider location/radiation/quality/duration/timing/severity/associated sxs/prior Treatment) HPI  The patient is an 81 year old female, recently diagnosed with severe hyperbilirubinemia after she was found to have a obstructing liver mass which was treated with extra and intrahepatic biliary drains. The thought was that she needed to have a hepatic surgeon evaluate her for possible resection as there was no other reasonable way to access this mass, appointment was scheduled for today, the family was unable to get her out of bed because of severe weakness and abdominal pain. They have noticed that in the last 24 hours the drainage from the biliary tube has changed from yellow to a dark brown color. There is no fevers or vomiting but she has no appetite and has had very little to eat. She is making very little urine, no diarrhea, no bleeding, no coughing, no other complaints. The abdominal pain is progressive over the last 24 hours  Past Medical History  Diagnosis Date  . Hypothyroidism   . Osteoporosis   . Atrial fibrillation (HCC)     no anticoagulation.   . Jaundice 04/06/2015    Sudden onset, 10 days following initiation of Zepatier for Hep C.   . Abdominal distention 04/06/2015  . Aortic stenosis 10/2014    by echo.   . Hypercholesterolemia   . History of blood transfusion ~ 2006    "related to bleeding stomach ulcer"  . Bleeding stomach ulcer ~ 2006  . Hepatitis C ~ 2006    needle stick in 1970s  . Cancer [facility])    Past Surgical History  Procedure Laterality Date  . Hip fracture surgery Right 2012  . Fracture surgery    . Cataract extraction w/ intraocular lens  implant, bilateral Bilateral 1990's   Family History  Problem Relation Age of Onset  . Stroke Mother    Social History  Substance Use  Topics  . Smoking status: Former Smoker -- 0.12 packs/day for 10 years    Types: Cigarettes  . Smokeless tobacco: Never Used     Comment: "quit smoking in the 1990's"  . Alcohol Use: No   OB History    No data available     Review of Systems  All other systems reviewed and are negative.     Allergies  Review of patient's allergies indicates no known allergies.  Home Medications   Prior to Admission medications   Medication Sig Start Date End Date Taking? Authorizing Provider  levothyroxine (SYNTHROID, LEVOTHROID) 88 MCG tablet Take 1 tablet (88 mcg total) by mouth daily before breakfast. 04/15/15  Yes Albertine Patricia, MD  metoprolol tartrate (LOPRESSOR) 25 MG tablet Take 0.5 tablets (12.5 mg total) by mouth 2 (two) times daily. 04/15/15  Yes Silver Huguenin Elgergawy, MD  ondansetron (ZOFRAN) 4 MG tablet Take 1 tablet (4 mg total) by mouth every 6 (six) hours as needed for nausea. 04/15/15  Yes Albertine Patricia, MD  OVER THE COUNTER MEDICATION Take 1 capsule by mouth 4 (four) times daily. Biofilm Detox   Yes Historical Provider, MD  OVER THE COUNTER MEDICATION Take 1 capsule by mouth 4 (four) times daily. Homeopathic supplement   Yes Historical Provider, MD  OVER THE COUNTER MEDICATION Take 1 capsule by mouth 2 (two) times daily. ATP Max   Yes Historical  Provider, MD  OVER THE COUNTER MEDICATION Take 10 drops by mouth daily. Chelidonium   Yes Historical Provider, MD  oxyCODONE (ROXICODONE) 5 MG immediate release tablet Take 1 tablet (5 mg total) by mouth every 6 (six) hours as needed for severe pain. 04/15/15  Yes Albertine Patricia, MD  TURMERIC PO Take 1 capsule by mouth daily.   Yes Historical Provider, MD  feeding supplement, ENSURE ENLIVE, (ENSURE ENLIVE) LIQD Take 237 mLs by mouth 2 (two) times daily between meals. Patient not taking: Reported on 04/22/2015 04/15/15   Silver Huguenin Elgergawy, MD   BP 117/59 mmHg  Pulse 70  Temp(Src) 97.5 F (36.4 C) (Oral)  Resp 28  Ht 4\' 6"  (1.372  m)  Wt 101 lb 10.1 oz (46.1 kg)  BMI 24.49 kg/m2  SpO2 99% Physical Exam  Constitutional: She appears well-developed and well-nourished. No distress.  HENT:  Head: Normocephalic and atraumatic.  Mouth/Throat: Oropharynx is clear and moist. No oropharyngeal exudate.  Eyes: EOM are normal. Pupils are equal, round, and reactive to light. Right eye exhibits no discharge. Left eye exhibits no discharge. No scleral icterus.  Severe jaundice  Neck: Normal range of motion. Neck supple. No JVD present. No thyromegaly present.  Cardiovascular: Normal rate, regular rhythm and intact distal pulses.  Exam reveals no gallop and no friction rub.   Murmur heard. Pulmonary/Chest: Effort normal and breath sounds normal. No respiratory distress. She has no wheezes. She has no rales.  Abdominal: Soft. Bowel sounds are normal. She exhibits no distension and no mass. There is tenderness ( mild diffuse ttp, has purulent drainage around the biliary drain).  Musculoskeletal: Normal range of motion. She exhibits no edema or tenderness.  Lymphadenopathy:    She has no cervical adenopathy.  Neurological: She is alert. Coordination normal.  Severe generalized weakness  Skin: Skin is warm and dry. No rash noted. No erythema.  Psychiatric: She has a normal mood and affect. Her behavior is normal.  Nursing note and vitals reviewed.   ED Course  Procedures (including critical care time) Labs Review Labs Reviewed  COMPREHENSIVE METABOLIC PANEL - Abnormal; Notable for the following:    Sodium 132 (*)    Potassium 6.4 (*)    CO2 9 (*)    Glucose, Bld 144 (*)    BUN 132 (*)    Creatinine, Ser 3.85 (*)    Calcium 7.8 (*)    Albumin 2.5 (*)    AST 107 (*)    Alkaline Phosphatase 210 (*)    Total Bilirubin 26.6 (*)    GFR calc non Af Amer 10 (*)    GFR calc Af Amer 12 (*)    Anion gap 20 (*)    All other components within normal limits  CBC - Abnormal; Notable for the following:    WBC 14.6 (*)    Hemoglobin  16.0 (*)    RDW 21.2 (*)    All other components within normal limits  URINALYSIS, ROUTINE W REFLEX MICROSCOPIC (NOT AT [facility]) - Abnormal; Notable for the following:    Color, Urine BROWN (*)    APPearance TURBID (*)    Hgb urine dipstick MODERATE (*)    Bilirubin Urine LARGE (*)    Protein, ur 30 (*)    Nitrite POSITIVE (*)    Leukocytes, UA SMALL (*)    All other components within normal limits  LIPASE, BLOOD - Abnormal; Notable for the following:    Lipase 56 (*)    All other  components within normal limits  POTASSIUM - Abnormal; Notable for the following:    Potassium 6.7 (*)    All other components within normal limits  URINE MICROSCOPIC-ADD ON - Abnormal; Notable for the following:    Bacteria, UA FEW (*)    All other components within normal limits  COMPREHENSIVE METABOLIC PANEL - Abnormal; Notable for the following:    Potassium 5.7 (*)    CO2 10 (*)    Glucose, Bld 141 (*)    BUN 124 (*)    Creatinine, Ser 3.65 (*)    Calcium 7.6 (*)    Total Protein 5.9 (*)    Albumin 2.1 (*)    AST 132 (*)    Alkaline Phosphatase 170 (*)    Total Bilirubin 22.0 (*)    GFR calc non Af Amer 11 (*)    GFR calc Af Amer 12 (*)    Anion gap 19 (*)    All other components within normal limits  CBC WITH DIFFERENTIAL/PLATELET - Abnormal; Notable for the following:    WBC 14.0 (*)    RDW 21.4 (*)    Neutro Abs 12.3 (*)    Lymphs Abs 0.6 (*)    Monocytes Absolute 1.1 (*)    All other components within normal limits  MRSA PCR SCREENING  GLUCOSE, CAPILLARY  POTASSIUM  BASIC METABOLIC PANEL    Imaging Review Ct Abdomen Pelvis Wo Contrast  05/06/2015   CLINICAL DATA:  Hepatobiliary catheter drainage has become darker, with some oozing at the insertion site. Increasing weakness.  EXAM: CT ABDOMEN AND PELVIS WITHOUT CONTRAST  TECHNIQUE: Multidetector CT imaging of the abdomen and pelvis was performed following the standard protocol without IV contrast.  COMPARISON:  04/07/2015 CT.  Procedural images from the transhepatic biliary interventions.  FINDINGS: The internal/external biliary drain appears satisfactorily positioned, entering the left hepatic lobe anteriorly, continuing into the central biliary system and then on down into the duodenum. The left hepatic ducts are decompressed. The right hepatic ducts are moderately distended. Extrahepatic bile ducts are decompressed. No significant fluid collection is evident along the course of the drainage catheter.  There is substantially reduced volume of ascites, with only a small perihepatic collection.  There are unremarkable unenhanced appearances of the pancreas, spleen, adrenals and kidneys. There is no bowel obstruction. There is no extraluminal air. No focal inflammatory changes are evident in the abdomen or pelvis. Uterus and adnexal structures appear unremarkable. The abdominal aorta is normal in caliber with moderate atherosclerotic calcification. No significant abnormality is evident in the lower chest. No significant skeletal lesion is evident. There are unchanged chronic appearing compressions of L2, L1 and T10.  IMPRESSION: 1. Intact appearances of the biliary drain, with satisfactory position. 2. No abnormal collection along the course of the drain. Moderate distention of the right hepatic biliary system but the left hepatic system and extrahepatic ducts are decompressed. 3. Substantially reduced ascites volume. 4. No acute findings are evident in the abdomen or pelvis.   Electronically Signed   By: Andreas Newport M.D.   On: 05/20/2015 22:22   I have personally reviewed and evaluated these images and lab results as part of my medical decision-making.   EKG Interpretation   Date/Time:  Monday May 02 2015 21:58:36 EDT Ventricular Rate:  75 PR Interval:  162 QRS Duration: 146 QT Interval:  433 QTC Calculation: 484 R Axis:     Text Interpretation:  Sinus rhythm Nonspecific intraventricular conduction  delay Anterior  infarct, acute (LAD)  ED PHYSICIAN INTERPRETATION AVAILABLE  IN [place] Confirmed by TEST, Record ([id number]) on 05/03/2015 7:51:25  AM      MDM   Final diagnoses:  Acute renal failure, unspecified acute renal failure type ([place])  Uremia  Acidosis    Evaluate with CT for alternate source of pain / abscess, labs pending, remarkably, VS are unremarkable.  Labs are very Remarkable, hyperkalemia, acute renal failure, acidosis, severe uremia. Vital signs remain unremarkable but the patient is somnolent and obtunded and needs IV fluid resuscitation and treatment for acute hyperkalemia. Meds given as below.  #1 - Calcium #2 - Bicarbonate #3 - Insulin / D50 #4 - Kayexalate #5 - IV Fluids  Discussed with Dr. Hal Hope of the hospitalist service, he will admit the patient to stepdown.  CRITICAL CARE Performed by: Johnna Acosta Total critical care time: 35 Critical care time was exclusive of separately billable procedures and treating other patients. Critical care was necessary to treat or prevent imminent or life-threatening deterioration. Critical care was time spent personally by me on the following activities: development of treatment plan with patient and/or surrogate as well as nursing, discussions with consultants, evaluation of patient's response to treatment, examination of patient, obtaining history from patient or surrogate, ordering and performing treatments and interventions, ordering and review of laboratory studies, ordering and review of radiographic studies, pulse oximetry and re-evaluation of patient's condition.   Noemi Chapel, MD 05/03/15 [phone]

## 2015-05-02 NOTE — ED Notes (Signed)
Bed: WA20 Expected date:  Expected time:  Means of arrival:  Comments: EMS- 81yo F, [place] Pt/Jaundice

## 2015-05-03 ENCOUNTER — Inpatient Hospital Stay (HOSPITAL_COMMUNITY): Payer: Medicaid Other

## 2015-05-03 ENCOUNTER — Ambulatory Visit: Payer: No Typology Code available for payment source

## 2015-05-03 ENCOUNTER — Ambulatory Visit (HOSPITAL_COMMUNITY): Payer: MEDICAID

## 2015-05-03 ENCOUNTER — Encounter (HOSPITAL_COMMUNITY): Payer: Self-pay | Admitting: Internal Medicine

## 2015-05-03 ENCOUNTER — Other Ambulatory Visit: Payer: No Typology Code available for payment source

## 2015-05-03 ENCOUNTER — Ambulatory Visit: Payer: No Typology Code available for payment source | Admitting: Hematology

## 2015-05-03 DIAGNOSIS — N179 Acute kidney failure, unspecified: Secondary | ICD-10-CM

## 2015-05-03 DIAGNOSIS — R1084 Generalized abdominal pain: Secondary | ICD-10-CM

## 2015-05-03 DIAGNOSIS — R531 Weakness: Secondary | ICD-10-CM | POA: Insufficient documentation

## 2015-05-03 DIAGNOSIS — I482 Chronic atrial fibrillation, unspecified: Secondary | ICD-10-CM | POA: Diagnosis present

## 2015-05-03 LAB — CBC WITH DIFFERENTIAL/PLATELET
BASOS ABS: 0 10*3/uL (ref 0.0–0.1)
Basophils Relative: 0 %
EOS ABS: 0 10*3/uL (ref 0.0–0.7)
Eosinophils Relative: 0 %
HEMATOCRIT: 41.8 % (ref 36.0–46.0)
HEMOGLOBIN: 14.7 g/dL (ref 12.0–15.0)
LYMPHS PCT: 4 %
Lymphs Abs: 0.6 10*3/uL — ABNORMAL LOW (ref 0.7–4.0)
MCH: 31.7 pg (ref 26.0–34.0)
MCHC: 35.2 g/dL (ref 30.0–36.0)
MCV: 90.1 fL (ref 78.0–100.0)
MONOS PCT: 8 %
Monocytes Absolute: 1.1 10*3/uL — ABNORMAL HIGH (ref 0.1–1.0)
NEUTROS ABS: 12.3 10*3/uL — AB (ref 1.7–7.7)
NEUTROS PCT: 88 %
Platelets: 173 10*3/uL (ref 150–400)
RBC: 4.64 MIL/uL (ref 3.87–5.11)
RDW: 21.4 % — ABNORMAL HIGH (ref 11.5–15.5)
WBC: 14 10*3/uL — AB (ref 4.0–10.5)

## 2015-05-03 LAB — COMPREHENSIVE METABOLIC PANEL
ALBUMIN: 2.1 g/dL — AB (ref 3.5–5.0)
ALT: 53 U/L (ref 14–54)
ANION GAP: 19 — AB (ref 5–15)
AST: 132 U/L — ABNORMAL HIGH (ref 15–41)
Alkaline Phosphatase: 170 U/L — ABNORMAL HIGH (ref 38–126)
BILIRUBIN TOTAL: 22 mg/dL — AB (ref 0.3–1.2)
BUN: 124 mg/dL — ABNORMAL HIGH (ref 6–20)
CALCIUM: 7.6 mg/dL — AB (ref 8.9–10.3)
CO2: 10 mmol/L — ABNORMAL LOW (ref 22–32)
Chloride: 108 mmol/L (ref 101–111)
Creatinine, Ser: 3.65 mg/dL — ABNORMAL HIGH (ref 0.44–1.00)
GFR calc non Af Amer: 11 mL/min — ABNORMAL LOW (ref >60)
GFR, EST AFRICAN AMERICAN: 12 mL/min — AB (ref >60)
GLUCOSE: 141 mg/dL — AB (ref 65–99)
POTASSIUM: 5.7 mmol/L — AB (ref 3.5–5.1)
SODIUM: 137 mmol/L (ref 135–145)
TOTAL PROTEIN: 5.9 g/dL — AB (ref 6.5–8.1)

## 2015-05-03 LAB — MRSA PCR SCREENING: MRSA by PCR: NEGATIVE

## 2015-05-03 LAB — GLUCOSE, CAPILLARY: GLUCOSE-CAPILLARY: 96 mg/dL (ref 65–99)

## 2015-05-03 LAB — BASIC METABOLIC PANEL
ANION GAP: 11 (ref 5–15)
BUN: 124 mg/dL — ABNORMAL HIGH (ref 6–20)
CALCIUM: 7 mg/dL — AB (ref 8.9–10.3)
CO2: 12 mmol/L — AB (ref 22–32)
CREATININE: 3.2 mg/dL — AB (ref 0.44–1.00)
Chloride: 113 mmol/L — ABNORMAL HIGH (ref 101–111)
GFR, EST AFRICAN AMERICAN: 14 mL/min — AB (ref >60)
GFR, EST NON AFRICAN AMERICAN: 12 mL/min — AB (ref >60)
Glucose, Bld: 106 mg/dL — ABNORMAL HIGH (ref 65–99)
Potassium: 5.7 mmol/L — ABNORMAL HIGH (ref 3.5–5.1)
SODIUM: 136 mmol/L (ref 135–145)

## 2015-05-03 MED ORDER — ONDANSETRON HCL 4 MG PO TABS: 4.0000 mg | ORAL_TABLET | Freq: Four times a day (QID) | ORAL | Status: DC | PRN

## 2015-05-03 MED ORDER — ENOXAPARIN SODIUM 30 MG/0.3ML ~~LOC~~ SOLN
30.0000 mg | SUBCUTANEOUS | Status: DC
  Administered 2015-05-04 – 2015-05-05 (×2): 30 mg via SUBCUTANEOUS
  Filled 2015-05-03 (×3): qty 0.3

## 2015-05-03 MED ORDER — FENTANYL CITRATE (PF) 100 MCG/2ML IJ SOLN
INTRAMUSCULAR | Status: AC
  Filled 2015-05-03: qty 2

## 2015-05-03 MED ORDER — LIDOCAINE HCL 1 % IJ SOLN
INTRAMUSCULAR | Status: AC
  Administered 2015-05-03: 17:00:00
  Filled 2015-05-03: qty 20

## 2015-05-03 MED ORDER — NALOXONE HCL 0.4 MG/ML IJ SOLN
INTRAMUSCULAR | Status: AC
  Administered 2015-05-03: 18:00:00
  Filled 2015-05-03: qty 1

## 2015-05-03 MED ORDER — NALOXONE NEWBORN-WH INJECTION 0.4 MG/ML
0.4000 mg | Freq: Once | INTRAMUSCULAR | Status: AC
  Administered 2015-05-03: 0.4 mg via INTRAMUSCULAR

## 2015-05-03 MED ORDER — MIDAZOLAM HCL 2 MG/2ML IJ SOLN
INTRAMUSCULAR | Status: AC | PRN
  Administered 2015-05-03: 1 mg via INTRAVENOUS

## 2015-05-03 MED ORDER — ACETAMINOPHEN 650 MG RE SUPP
650.0000 mg | Freq: Four times a day (QID) | RECTAL | Status: DC | PRN
  Administered 2015-05-06: 650 mg via RECTAL
  Filled 2015-05-03: qty 1

## 2015-05-03 MED ORDER — METOPROLOL TARTRATE 1 MG/ML IV SOLN
2.5000 mg | Freq: Two times a day (BID) | INTRAVENOUS | Status: DC
  Administered 2015-05-03 – 2015-05-04 (×3): 2.5 mg via INTRAVENOUS
  Filled 2015-05-03 (×4): qty 5

## 2015-05-03 MED ORDER — MIDAZOLAM HCL 2 MG/2ML IJ SOLN
INTRAMUSCULAR | Status: AC
  Administered 2015-05-03: 1 mg
  Filled 2015-05-03: qty 4

## 2015-05-03 MED ORDER — ACETAMINOPHEN 325 MG PO TABS: 650.0000 mg | ORAL_TABLET | Freq: Four times a day (QID) | ORAL | Status: DC | PRN

## 2015-05-03 MED ORDER — FENTANYL CITRATE (PF) 100 MCG/2ML IJ SOLN
INTRAMUSCULAR | Status: AC
  Administered 2015-05-03: 50 ug
  Filled 2015-05-03: qty 2

## 2015-05-03 MED ORDER — ENOXAPARIN SODIUM 40 MG/0.4ML ~~LOC~~ SOLN: 40.0000 mg | SUBCUTANEOUS | Status: DC

## 2015-05-03 MED ORDER — FENTANYL CITRATE (PF) 100 MCG/2ML IJ SOLN
INTRAMUSCULAR | Status: AC | PRN
  Administered 2015-05-03: 50 ug via INTRAVENOUS

## 2015-05-03 MED ORDER — ONDANSETRON HCL 4 MG/2ML IJ SOLN
4.0000 mg | Freq: Four times a day (QID) | INTRAMUSCULAR | Status: DC | PRN
  Administered 2015-05-06: 4 mg via INTRAVENOUS
  Filled 2015-05-03: qty 2

## 2015-05-03 MED ORDER — LEVOTHYROXINE SODIUM 100 MCG IV SOLR
44.0000 ug | Freq: Every day | INTRAVENOUS | Status: DC
  Administered 2015-05-03 – 2015-05-07 (×5): 44 ug via INTRAVENOUS
  Filled 2015-05-03 (×6): qty 5

## 2015-05-03 MED ORDER — SODIUM CHLORIDE 0.9 % IV SOLN
INTRAVENOUS | Status: DC
  Administered 2015-05-03 – 2015-05-04 (×3): via INTRAVENOUS

## 2015-05-03 NOTE — H&P (Signed)
[facility] History and Physical  Diana Pearson HGD:[id number] DOB: 09-09-1928 DOA: 05/27/2015  Referring physician: Dr. Sabra Heck. PCP: [facility]  Specialists: Dr.Gautam. Oncologist.  Chief Complaint: Weakness.  History obtained from patient's daughter.  HPI: Diana Pearson is a 81 y.o. female who was recently admitted last month for obstructive jaundice with CT abdomen showing abdominal mass and patient had biliary drain placed was brought to the ER after patient was having poor appetite and weakness. As per patient's daughter patient has been doing very poorly over the last 1 week and over the last 2 days patient is unable to even walk. Has eaten anything over the last 2 days. Denies any nausea vomiting diarrhea fever chills or abdominal pain. Labs revealed acute renal failure with hyperkalemia with worsening bilirubin levels. Patient had recently been admitted and had extensive workup done with biopsies done were unrevealing. Patient was eventually referred to [facility] but was unable to keep the appointment due to weakness.   Review of Systems: As presented in the history of presenting illness, rest negative.  Past Medical History  Diagnosis Date  . Hypothyroidism   . Osteoporosis   . Atrial fibrillation (HCC)     no anticoagulation.   . Jaundice 04/06/2015    Sudden onset, 10 days following initiation of Zepatier for Hep C.   . Abdominal distention 04/06/2015  . Aortic stenosis 10/2014    by echo.   . Hypercholesterolemia   . History of blood transfusion ~ 2006    "related to bleeding stomach ulcer"  . Bleeding stomach ulcer ~ 2006  . Hepatitis C ~ 2006    needle stick in 1970s  . Cancer [facility])    Past Surgical History  Procedure Laterality Date  . Hip fracture surgery Right 2012  . Fracture surgery    . Cataract extraction w/ intraocular lens  implant, bilateral Bilateral 1990's   Social History:  reports that she has quit smoking. Her smoking use included  Cigarettes. She has a 1.2 pack-year smoking history. She has never used smokeless tobacco. She reports that she does not drink alcohol or use illicit drugs. Where does patient live home. Can patient participate in ADLs? Yes.  No Known Allergies  Family History:  Family History  Problem Relation Age of Onset  . Stroke Mother       Prior to Admission medications   Medication Sig Start Date End Date Taking? Authorizing Provider  levothyroxine (SYNTHROID, LEVOTHROID) 88 MCG tablet Take 1 tablet (88 mcg total) by mouth daily before breakfast. 04/15/15  Yes Albertine Patricia, MD  metoprolol tartrate (LOPRESSOR) 25 MG tablet Take 0.5 tablets (12.5 mg total) by mouth 2 (two) times daily. 04/15/15  Yes Silver Huguenin Elgergawy, MD  ondansetron (ZOFRAN) 4 MG tablet Take 1 tablet (4 mg total) by mouth every 6 (six) hours as needed for nausea. 04/15/15  Yes Albertine Patricia, MD  OVER THE COUNTER MEDICATION Take 1 capsule by mouth 4 (four) times daily. Biofilm Detox   Yes Historical Provider, MD  OVER THE COUNTER MEDICATION Take 1 capsule by mouth 4 (four) times daily. Homeopathic supplement   Yes Historical Provider, MD  OVER THE COUNTER MEDICATION Take 1 capsule by mouth 2 (two) times daily. ATP Max   Yes Historical Provider, MD  OVER THE COUNTER MEDICATION Take 10 drops by mouth daily. Chelidonium   Yes Historical Provider, MD  oxyCODONE (ROXICODONE) 5 MG immediate release tablet Take 1 tablet (5 mg total) by mouth every 6 (  six) hours as needed for severe pain. 04/15/15  Yes Albertine Patricia, MD  TURMERIC PO Take 1 capsule by mouth daily.   Yes Historical Provider, MD  feeding supplement, ENSURE ENLIVE, (ENSURE ENLIVE) LIQD Take 237 mLs by mouth 2 (two) times daily between meals. Patient not taking: Reported on 04/22/2015 04/15/15   Albertine Patricia, MD    Physical Exam: Filed Vitals:   05/10/2015 2316 05/06/2015 2319 05/03/15 0000 05/03/15 0100  BP: 134/57     Pulse: 94  87 83  Temp:      TempSrc:       Resp: 15  13 19   Height:  4\' 6"  (1.372 m)    Weight:  46.1 kg (101 lb 10.1 oz)    SpO2: 100%  99% 99%     General:  Moderately built and poorly nourished.  Eyes: Icterus present.  ENT: No discharge from the ears eyes nose and mouth.  Neck: No mass felt.  Cardiovascular: S1-S2 heard.  Respiratory: No rhonchi or crepitations.  Abdomen: Soft nontender bowel sounds present.  Skin: No rash.  Musculoskeletal: No edema.  Psychiatric: Appears normal.  Neurologic: Alert awake and follows commands.  Labs on Admission:  Basic Metabolic Panel:  Recent Labs Lab 05/17/2015 2005 05/01/2015 2111  NA 132*  --   K 6.4* 6.7*  CL 103  --   CO2 9*  --   GLUCOSE 144*  --   BUN 132*  --   CREATININE 3.85*  --   CALCIUM 7.8*  --    Liver Function Tests:  Recent Labs Lab 05/05/2015 2005  AST 107*  ALT 48  ALKPHOS 210*  BILITOT 26.6*  PROT 6.9  ALBUMIN 2.5*    Recent Labs Lab 05/01/2015 2005  LIPASE 56*   No results for input(s): AMMONIA in the last 168 hours. CBC:  Recent Labs Lab 05/20/2015 2005  WBC 14.6*  HGB 16.0*  HCT 44.4  MCV 88.3  PLT 232   Cardiac Enzymes: No results for input(s): CKTOTAL, CKMB, CKMBINDEX, TROPONINI in the last 168 hours.  BNP (last 3 results) No results for input(s): BNP in the last 8760 hours.  ProBNP (last 3 results) No results for input(s): PROBNP in the last 8760 hours.  CBG: No results for input(s): GLUCAP in the last 168 hours.  Radiological Exams on Admission: Ct Abdomen Pelvis Wo Contrast  05/06/2015   CLINICAL DATA:  Hepatobiliary catheter drainage has become darker, with some oozing at the insertion site. Increasing weakness.  EXAM: CT ABDOMEN AND PELVIS WITHOUT CONTRAST  TECHNIQUE: Multidetector CT imaging of the abdomen and pelvis was performed following the standard protocol without IV contrast.  COMPARISON:  04/07/2015 CT. Procedural images from the transhepatic biliary interventions.  FINDINGS: The internal/external  biliary drain appears satisfactorily positioned, entering the left hepatic lobe anteriorly, continuing into the central biliary system and then on down into the duodenum. The left hepatic ducts are decompressed. The right hepatic ducts are moderately distended. Extrahepatic bile ducts are decompressed. No significant fluid collection is evident along the course of the drainage catheter.  There is substantially reduced volume of ascites, with only a small perihepatic collection.  There are unremarkable unenhanced appearances of the pancreas, spleen, adrenals and kidneys. There is no bowel obstruction. There is no extraluminal air. No focal inflammatory changes are evident in the abdomen or pelvis. Uterus and adnexal structures appear unremarkable. The abdominal aorta is normal in caliber with moderate atherosclerotic calcification. No significant abnormality is evident in the  lower chest. No significant skeletal lesion is evident. There are unchanged chronic appearing compressions of L2, L1 and T10.  IMPRESSION: 1. Intact appearances of the biliary drain, with satisfactory position. 2. No abnormal collection along the course of the drain. Moderate distention of the right hepatic biliary system but the left hepatic system and extrahepatic ducts are decompressed. 3. Substantially reduced ascites volume. 4. No acute findings are evident in the abdomen or pelvis.   Electronically Signed   By: Andreas Newport M.D.   On: 05/27/2015 22:22    EKG: Independently reviewed. Normal sinus rhythm with nonspecific IVCD. QTC of 484 ms.  Assessment/Plan Principal Problem:   Acute renal failure (HCC) Active Problems:   Chronic hepatitis C without hepatic coma (HCC)   Hypothyroidism   Hyperkalemia   Chronic atrial fibrillation (HCC)   Acute renal failure (ARF) ([place])   1. Acute renal failure with hyperkalemia - probably secondary to poor oral intake. At this time patient will be gently hydrated and closely follow  intake output and metabolic panel. CT abdomen and pelvis does not show anything acute. Patient did receive insulin and calcium gluconate and sodium bicarbonate in the ER. But patient did not take Kayexalate since patient is finding it difficult to swallow. 2. Obstructive jaundice with liver mass - jaundice has worsened and has biliary drain which has been found to have more dark fluid draining. Will get patient's oncologist Dr. Irene Limbo and also request IR consult. Follow LFTs. 3. Hypothyroidism - for now I have placed patient on IV Synthroid. 4. History of atrial fibrillation - patient's chads 2 vasc score is more than 2 but patient is not a candidate for anticoagulation. Since patient cannot swallow at this time I have placed patient on IV metoprolol. 5. History of severe aortic stenosis - closely monitor respiratory status since patient is receiving IV fluids.  I have reviewed patient's old charts of labs. Personally reviewed patient's EKG. Have requested palliative care consult.   DVT Prophylaxis Lovenox.  Code Status: DO NOT RESUSCITATE.  Family Communication: Patient's daughter.  Disposition Plan: Admit to inpatient.    Hussien Greenblatt N. [facility] Pager [phone].  If 7PM-7AM, please contact night-coverage [email or web address] Password [email or web address] 05/03/2015, 1:49 AM

## 2015-05-03 NOTE — Progress Notes (Signed)
Chief Complaint: Patient was seen in consultation today for biliary obstruction  Chief Complaint  Patient presents with  . Abdominal Pain   at the request of [facility]  Referring Physician(s): [facility]  History of Present Illness: Diana Pearson is a 81 y.o. female Biliary obstruction secondary to tumor. She is s/p right external biliary drain placed 04/09/15. S/p Left internal/external biliary drain placed 04/14/15 and S/p cholangiogram with exchange of left sided internal/external drain and removal of right external biliary drain on 04/25/15. She was discharged and presented to ED on 10/3, now admitted with weakness, decreased oral intake and change in biliary drainage-darker in color. IR received request for evaluation. Patient speaks Arabic and history is obtained per chart review.   Past Medical History  Diagnosis Date  . Hypothyroidism   . Osteoporosis   . Atrial fibrillation (HCC)     no anticoagulation.   . Jaundice 04/06/2015    Sudden onset, 10 days following initiation of Zepatier for Hep C.   . Abdominal distention 04/06/2015  . Aortic stenosis 10/2014    by echo.   . Hypercholesterolemia   . History of blood transfusion ~ 2006    "related to bleeding stomach ulcer"  . Bleeding stomach ulcer ~ 2006  . Hepatitis C ~ 2006    needle stick in 1970s  . Cancer [facility])     Past Surgical History  Procedure Laterality Date  . Hip fracture surgery Right 2012  . Fracture surgery    . Cataract extraction w/ intraocular lens  implant, bilateral Bilateral 1990's    Allergies: Review of patient's allergies indicates no known allergies.  Medications: Prior to Admission medications   Medication Sig Start Date End Date Taking? Authorizing Provider  levothyroxine (SYNTHROID, LEVOTHROID) 88 MCG tablet Take 1 tablet (88 mcg total) by mouth daily before breakfast. 04/15/15  Yes Albertine Patricia, MD  metoprolol tartrate (LOPRESSOR) 25 MG tablet Take 0.5 tablets (12.5 mg total) by mouth 2 (two)  times daily. 04/15/15  Yes Silver Huguenin Elgergawy, MD  ondansetron (ZOFRAN) 4 MG tablet Take 1 tablet (4 mg total) by mouth every 6 (six) hours as needed for nausea. 04/15/15  Yes Albertine Patricia, MD  OVER THE COUNTER MEDICATION Take 1 capsule by mouth 4 (four) times daily. Biofilm Detox   Yes Historical Provider, MD  OVER THE COUNTER MEDICATION Take 1 capsule by mouth 4 (four) times daily. Homeopathic supplement   Yes Historical Provider, MD  OVER THE COUNTER MEDICATION Take 1 capsule by mouth 2 (two) times daily. ATP Max   Yes Historical Provider, MD  OVER THE COUNTER MEDICATION Take 10 drops by mouth daily. Chelidonium   Yes Historical Provider, MD  oxyCODONE (ROXICODONE) 5 MG immediate release tablet Take 1 tablet (5 mg total) by mouth every 6 (six) hours as needed for severe pain. 04/15/15  Yes Albertine Patricia, MD  TURMERIC PO Take 1 capsule by mouth daily.   Yes Historical Provider, MD  feeding supplement, ENSURE ENLIVE, (ENSURE ENLIVE) LIQD Take 237 mLs by mouth 2 (two) times daily between meals. Patient not taking: Reported on 04/22/2015 04/15/15   Albertine Patricia, MD     Family History  Problem Relation Age of Onset  . Stroke Mother     Social History   Social History  . Marital Status: Widowed    Spouse Name: N/A  . Number of Children: N/A  . Years of Education: N/A   Social History Main Topics  . Smoking status:  Former Smoker -- 0.12 packs/day for 10 years    Types: Cigarettes  . Smokeless tobacco: Never Used     Comment: "quit smoking in the 1990's"  . Alcohol Use: No  . Drug Use: No  . Sexual Activity: No   Other Topics Concern  . None   Social History Narrative    Review of Systems: A 12 point ROS discussed and pertinent positives are indicated in the HPI above.  All other systems are negative.  Review of Systems  Vital Signs: BP 142/44 mmHg  Pulse 66  Temp(Src) 97.6 F (36.4 C) (Oral)  Resp 14  Ht 4\' 6"  (1.372 m)  Wt 101 lb 10.1 oz (46.1 kg)  BMI  24.49 kg/m2  SpO2 100%  Physical Exam General: NAD, grimaces with touch to abdomen  Abd: TTP, Biliary drain intact with dark brown bilious output with debris within 300 cc in bag  Imaging: Ct Abdomen Pelvis W Wo Contrast  04/08/2015   CLINICAL DATA:  81 year old female with hepatitis-C, jaundice, hypothyroidism and abdominal distention. Possible portal vein thrombosis on recent sonogram.  EXAM: CT ABDOMEN AND PELVIS WITHOUT AND WITH CONTRAST  TECHNIQUE: Multidetector CT imaging of the abdomen and pelvis was performed following the standard protocol before and following the bolus administration of intravenous contrast.  CONTRAST:  188mL OMNIPAQUE IOHEXOL 300 MG/ML  SOLN  COMPARISON:  04/07/2015 abdominal sonogram.  FINDINGS: Lower chest: Cardiomegaly. There is atherosclerosis of the thoracic aorta, the great vessels of the mediastinum and the coronary arteries, including calcified atherosclerotic plaque in the left anterior descending, left circumflex and right coronary arteries. Aortic valvular and mitral annular calcifications are present. Subsegmental dependent atelectasis at both lung bases.  Hepatobiliary: The liver surface is diffusely irregular and there is asymmetric hypertrophy of the lateral left liver lobe, indicating cirrhosis. There is an ill-defined 2.9 x 2.2 cm liver mass within the central segment 8 right liver lobe (series 501/image 49), which demonstrates progressive heterogeneous enhancement. There is marked diffuse intrahepatic biliary ductal dilatation. There is an enhancing tumor thrombus at the main portal vein bifurcation extending into the proximal left and right portal vein branches. The gallbladder is contracted. The common bile duct is normal caliber (4 mm diameter). There are no additional liver masses.  Pancreas: No pancreatic mass. Ill-defined peripancreatic fluid throughout the length of the pancreas. No focal peripancreatic fluid collection. No main pancreatic duct dilation.   Spleen: Top normal size spleen (12.2 cm craniocaudal splenic length). No splenic mass.  Adrenals/Urinary Tract: Normal adrenals. Simple 1.0 cm posterior interpolar left renal cyst. Simple 1.1 cm exophytic lower left renal cyst. Additional subcentimeter hypodense left renal lesions, too small to characterize. No hydronephrosis. No nephrolithiasis. Visualized ureters are normal caliber. On delayed imaging, there is no urothelial wall thickening and there are no filling defects in the opacified portions of the bilateral collecting systems or visualized proximal ureters.  Stomach/Bowel: Relatively collapsed and grossly normal stomach normal caliber small bowel, with no appreciable small bowel wall thickening normal appendix. Normal large bowel, with no large bowel wall thickening.  Vascular/Lymphatic: Atherosclerotic nonaneurysmal abdominal aorta. Patent splenic, hepatic and renal veins. Enlarged 1.4 cm short axis gastrohepatic ligament lymph node (series 501/ image 65). Mild porta hepatis lymphadenopathy.  Reproductive: Grossly normal uterus. Simple 2.3 cm left adnexal cystic structure (series 501/image 152).  Other: Small volume ascites.  No pneumoperitoneum.  Musculoskeletal: Partially visualized right total hip arthroplasty. Mild T10 and L1 vertebral body compression fractures and severe L2 vertebral body compression fracture of  indeterminate chronicity. Mild to moderate degenerative changes in the visualized thoracolumbar spine. No aggressive appearing focal osseous lesions.  IMPRESSION: 1. 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 A cholangiocarcinoma (Kl[place]kin tumor) is favored given the location and biliary ductal dilatation, however hepatocellular carcinoma is on the differential given the underlying cirrhosis and  the presence of portal vein tumor thrombus. 2. Small volume ascites.  Top-normal size spleen. 3. Mild porta hepatis and gastrohepatic ligament lymphadenopathy, nonspecific. 4. Mild T10 and L1 and superior L2 vertebral body compression fractures of indeterminate chronicity, likely chronic. 5. Cardiomegaly. Atherosclerosis, including three-vessel coronary artery disease. Please note that although the presence of coronary artery calcium documents the presence of coronary artery disease, the severity of this disease and any potential stenosis cannot be assessed on this non-gated CT examination. 6. Simple benign-appearing 2.3 cm left adnexal cyst. No follow-up is required. This recommendation follows ACR consensus guidelines: White Paper of the ACR Incidental Findings Committee II on Adnexal Findings. J Am Coll Radiol [phone]. These results were called by telephone at the time of interpretation on 04/08/2015 at 8:55 am to Dr. Nicoletta Ba , who verbally acknowledged these results.   Electronically Signed   By: Ilona Sorrel M.D.   On: 04/08/2015 09:06   Ct Abdomen Pelvis Wo Contrast  05/25/2015   CLINICAL DATA:  Hepatobiliary catheter drainage has become darker, with some oozing at the insertion site. Increasing weakness.  EXAM: CT ABDOMEN AND PELVIS WITHOUT CONTRAST  TECHNIQUE: Multidetector CT imaging of the abdomen and pelvis was performed following the standard protocol without IV contrast.  COMPARISON:  04/07/2015 CT. Procedural images from the transhepatic biliary interventions.  FINDINGS: The internal/external biliary drain appears satisfactorily positioned, entering the left hepatic lobe anteriorly, continuing into the central biliary system and then on down into the duodenum. The left hepatic ducts are decompressed. The right hepatic ducts are moderately distended. Extrahepatic bile ducts are decompressed. No significant fluid collection is evident along the course of the drainage catheter.  There is  substantially reduced volume of ascites, with only a small perihepatic collection.  There are unremarkable unenhanced appearances of the pancreas, spleen, adrenals and kidneys. There is no bowel obstruction. There is no extraluminal air. No focal inflammatory changes are evident in the abdomen or pelvis. Uterus and adnexal structures appear unremarkable. The abdominal aorta is normal in caliber with moderate atherosclerotic calcification. No significant abnormality is evident in the lower chest. No significant skeletal lesion is evident. There are unchanged chronic appearing compressions of L2, L1 and T10.  IMPRESSION: 1. Intact appearances of the biliary drain, with satisfactory position. 2. No abnormal collection along the course of the drain. Moderate distention of the right hepatic biliary system but the left hepatic system and extrahepatic ducts are decompressed. 3. Substantially reduced ascites volume. 4. No acute findings are evident in the abdomen or pelvis.   Electronically Signed   By: Andreas Newport M.D.   On: 05/28/2015 22:22   Ct Chest W Contrast  04/15/2015   CLINICAL DATA:  81 year old with current history of hepatitis-C and cirrhosis, presenting this admission with a central liver mass causing biliary obstruction, hepatocellular carcinoma versus cholangiocarcinoma. Staging CT to evaluate for metastatic disease.  EXAM: CT CHEST WITH CONTRAST  TECHNIQUE: Multidetector CT imaging of the chest was performed during intravenous contrast administration.  CONTRAST:  60  mL OMNIPAQUE IOHEXOL 300 MG/ML IV.  COMPARISON:  None.  FINDINGS: Lungs: No evidence of pulmonary metastasis. Atelectasis in the lower lobes. No confluent airspace consolidation. No evidence of interstitial lung disease.  Trachea/bronchi: Central airways patent without significant bronchial wall thickening. Calcification of the tracheobronchial cartilages.  Pleura:  No pleural plaques or masses.  No pleural effusions.  Mediastinum:  No  mediastinal masses.  Heart and Vascular: Heart markedly enlarged. Moderate to marked left ventricular hypertrophy. Extensive mitral annular calcification. Extensive aortic valvular calcification. Moderate LAD coronary atherosclerosis. No pericardial effusion. Moderate to marked atherosclerosis involving the thoracic aorta without aneurysm or dissection.  Lymphatic: No pathologic mediastinal, hilar or axillary lymphadenopathy.  Other findings: None.  Visualized lower neck: Thyroid gland atrophic. No evidence of supraclavicular lymphadenopathy.  Visualized upper abdomen: Biliary drainage catheters in place in the liver with residual moderate to marked biliary ductal dilation in the posterior segment right lobe. Left lobe bile ducts decompressed. Central hepatic mass as detailed on the prior CT abdomen and pelvis examination 1 week ago.  Musculoskeletal: Osseous demineralization with a mild compression fracture of the upper endplate of Z12 which does not appear acute and is felt to be due to osteoporosis. No evidence of osseous metastatic disease.  IMPRESSION: 1. No evidence of metastatic disease in the thorax. 2. Marked cardiomegaly with moderate to marked left ventricular hypertrophy, extensive mitral annular calcification and extensive aortic valvular calcification. 3. Biliary drainage catheters in place in the visualized liver with residual biliary ductal dilation in the posterior segment of the right lobe. The left lobe bile ducts are decompressed. 4. Central hepatic mass as detailed on the prior CT abdomen pelvis examination 1 week ago.   Electronically Signed   By: Evangeline Dakin M.D.   On: 04/15/2015 07:55   US Abdomen Complete  04/06/2015   CLINICAL DATA:  Acute liver failure.  Jaundice.  EXAM: ULTRASOUND ABDOMEN COMPLETE  COMPARISON:  None.  FINDINGS: Gallbladder: Gallbladder is not visualized and may be surgically absent or contracted.  Common bile duct: Diameter: 5.3 mm, normal  Liver: Coarsened  parenchymal echotexture with nodular contour suggesting hepatic cirrhosis. Prominence of portal veins with flow in the appropriate direction. Suggestion of mild intrahepatic bile duct dilatation. No focal liver lesions identified.  IVC: No abnormality visualized.  Pancreas: Visualized portion unremarkable.  Spleen: Spleen appears enlarged with homogeneous parenchymal echotexture.  Right Kidney: Length: 10.3 cm. Echogenicity within normal limits. No mass or hydronephrosis visualized.  Left Kidney: Length: 9.6 cm. Small cyst demonstrated in the lower pole measuring up to about 1 cm diameter. No hydronephrosis.  Abdominal aorta: No aneurysm visualized.  Other findings: None.  IMPRESSION: Coarsened liver echotexture with nodular contour and prominent pulmonary venous structures consistent with hepatic cirrhosis. No focal lesions identified. Suggestion of intrahepatic bile duct dilatation without extrahepatic ductal dilatation. Gallbladder not visualized. Probable splenic enlargement.   Electronically Signed   By: Lucienne Capers M.D.   On: 04/06/2015 23:51   [place] Art/ven Flow Abd Pelv Doppler  04/07/2015   CLINICAL DATA:  Jaundice, hepatic cirrhosis.  EXAM: DUPLEX ULTRASOUND OF LIVER  TECHNIQUE: Color and duplex Doppler ultrasound was performed to evaluate the hepatic in-flow and out-flow vessels. Exam is significantly limited as patient does not speak English and unable to follow breathing instructions.  COMPARISON:  None.  FINDINGS: Portal Vein Velocities  Main: 18.1 cm/sec proximally ; hepatopetal flow is noted proximally and in the midportion, but no flow is noted distally consistent with portal venous thrombus.  Right:  Not visualized due to limitations described above.  Left:  Not visualized due to limitations described above.  Hepatic Vein Velocities  Right:  20.5 cm/sec ; hepatofugal flow is noted.  Middle:  Not visualized due to limitations described above.  Left:  29.7 Cm/sec ; hepatofugal flow is noted.   Hepatic Artery Velocity:  146 cm/sec  Splenic Vein Velocity:  17.1 cm/sec  Varices: Present.  Ascites: Present.  IMPRESSION: Exam is significantly limited as patient does not speak English and unable to follow breathing instructions. There is probable occlusive thrombus seen in the distal portal vein. Middle hepatic vein is not visualized due to previously described limitations, but right and left hepatic veins demonstrate normal flow and velocity. These results will be called to the ordering clinician or representative by the Radiologist Assistant, and communication documented in the PACS or zVision Dashboard.   Electronically Signed   By: Marijo Conception, M.D.   On: 04/07/2015 08:12   Ir Biliary Drain Placement With Cholangiogram  04/14/2015   CLINICAL DATA:  Biliary obstruction.  Klatskin tumor.  EXAM: IR CONVERT BILIARY DRAIN TO INT-EXT BILARY DRAIN; IR BILIARY DRAIN PLACEMENT W/ CHOLANGIOGRAM  FLUOROSCOPY TIME:  22 minutes and 54 seconds  MEDICATIONS AND MEDICAL HISTORY: Versed 2 mg, Fentanyl 100 mcg.  Additional Medications: 3.75 g of Zosyn.  ANESTHESIA/SEDATION: Moderate sedation time: 55 minutes  CONTRAST:  40 cc Omnipaque 300  PROCEDURE: The procedure, risks, benefits, and alternatives were explained to the patient. Questions regarding the procedure were encouraged and answered. The patient understands and consents to the procedure.  The abdomen and right flank were prepped with Betadine in a sterile fashion, and a sterile drape was applied covering the operative field. A sterile gown and sterile gloves were used for the procedure.  The right biliary drain was exchanged over a Bentson wire for an 8 [place] sheath. Sheath cholangiogram was performed. A Kumpe the catheter was advanced over the Bentson wire to the central biliary tree. Exhaustive attempts were made to advance the catheter across the obstruction into the common bile duct. Wires utilized included roadrunner an glide wires. Catheters utilized  included a copy catheter, cobra catheter, and Sos Omni catheter.  Under sonographic guidance, a 21 gauge needle was inserted into a peripheral left duct. This was removed over a 018 wire which was up sized to a 3 J. a Kumpe the catheter was easily advanced over the J-wire, across the obstruction and into the common bile duct. This was advanced over a glidewire into the duodenum. It was exchanged over an Amplatz wire for a 10 [place] biliary drain. Several additional sideholes were cut above the marker. The right biliary sheath was exchanged for a Tracy strain. This was advanced in the central biliary tree. Contrast was injected into both catheters. A sample was sent for cytology.  FINDINGS: Sheath cholangiography confirms central biliary obstruction.  Subsequent images demonstrate placement of a left internal external biliary drain with its tip in the duodenum. The right external biliary drain is coiled in the central biliary tree.  COMPLICATIONS: None  IMPRESSION: Successful left internal external biliary drain with passage across the obstruction. The right biliary drain could not be passed across the obstruction secondary to suboptimal geometry. The patient will return next week for an additional right biliary drain.   Electronically Signed   By: Marybelle Killings M.D.   On: 04/14/2015 18:15   Ir Biliary Drain Placement With Cholangiogram  04/09/2015   CLINICAL DATA:  81 year old  female presents with jaundice, hyperbilirubinemia, and imaging diagnosis of hilar tumor in the liver. CT demonstrates intrahepatic biliary ductal dilatation.  She has been referred for percutaneous transhepatic cholangiogram and drainage.  EXAM: IR BILIARY DRAIN PLACEMENT W/ CHOLANGIOGRAM; IR ENDOLUMNICAL BIOPSY OF BILIARY TREE  COMPARISON:  CT 04/07/2015  ANESTHESIA/SEDATION: 4.0 IV Versed; 2 high mcg IV Fentanyl.  Total Moderate Sedation Time: 45 minutes.  CONTRAST:  48mL OMNIPAQUE IOHEXOL 300 MG/ML  SOLN  MEDICATIONS:  Receiving Zosyn  Procedural  FLUOROSCOPY TIME:  18 minutes 36 second  TECHNIQUE: The procedure, risks, benefits, and alternatives were explained to the patient and the patient's family. A complete informed consent was performed, with risk benefit analysis. Specific risks that were discussed for the procedure include bleeding, infection, biliary sepsis, IC use day, organ injury, need for further procedure, need for further surgery, long-term drain placement, cardiopulmonary collapse, death. Questions regarding the procedure were encouraged and answered. The patient understands and consents to the procedure.  Patient is position in supine position on the fluoroscopy table, and the upper abdomen was prepped and draped in the usual sterile fashion. Maximum barrier sterile technique with sterile gowns and gloves were used for the procedure. A timeout was performed prior to the initiation of the procedure. Local anesthesia was provided with 1% lidocaine with epinephrine.  Ultrasound survey of the right liver lobe was performed. Window into the right biliary system was present for the right liver approach.  1% lidocaine was used for local anesthesia, with generous infiltration of the skin and subcutaneous tissues in and inter left costal location. A Chiba needle was advanced under ultrasound guidance into the right liver lobe.  Once the tip of the needle was confirmed within the biliary system by injecting small aliquots of contrast, images were stored of the biliary system after partially opacifying the biliary tree via the needle.  Once the tip of this needle was confirmed within the biliary system, an 018 wire was advanced centrally. The needle was removed, a small incision was made with an 11 blade scalpel.  An Accustick system would not advance into the biliary system, and [place] a micropuncture system was advanced over the 018 wire into the biliary tree. The inner dilator and wire were removed, and an 035 catheter was  advanced into the biliary system. A standard 6 French sheath was then placed in the biliary system.  Cholangiogram was then performed.  A combination of Glidewire, 4 [place] glide cath, 5 [place] cobra catheter, as well as a 6 [place] beacon tip 35 cm sheath used to attempt to navigate through the obstruction with no success.  A standard pigtail 10 French drainage catheter was placed over the [employer].  We confirmed placement with contrast infusion.  The patient tolerated the procedure well and remained hemodynamically stable throughout.  No complications were encountered and no significant blood loss was encountered.  COMPLICATIONS: None  FINDINGS: Ultrasound survey demonstrates dilated intrahepatic biliary ducts. Approach into the right liver lobe was identified.  Percutaneous transhepatic cholangiogram demonstrates moderate to severely dilated intrahepatic biliary ducts. There is communication of the left and right-sided ducts, and the os a right and left drain may not be required for drainage.  Dilated ducts of the segment 4B should not be confused for common hepatic duct. Common hepatic duct appears to be closer to the patient's right side.  Small amount of contrast did opacified I the common hepatic duct and traversed the common bile duct into the duodenum. Cystic duct opacified with  incomplete filling of the gallbladder.  Unable to traverse the obstruction from our initial puncture.  IMPRESSION: Status post percutaneous transhepatic cholangiogram with demonstration of high-grade stenosis of the common hepatic duct, and subsequent placement of externalized standard 10 French pigtail catheter drain.  Communication of left and right-sided ducts was demonstrated.  Signed,  Dulcy Fanny. Earleen Newport, DO  [facility]  [facility]  PLAN: Would recommend repeat through the tube cholangiogram on Monday or Tuesday of the upcoming week for drainage catheter exchange and a second  attempt at traversing the obstruction.  Twice a day 10 cc saline flushes until drain exchange.   Electronically Signed   By: Corrie Mckusick D.O.   On: 04/09/2015 13:51   Ir Convert Biliary Drain To Int Ext Biliary Drain  04/14/2015   CLINICAL DATA:  Biliary obstruction.  Kl[place]kin tumor.  EXAM: IR CONVERT BILIARY DRAIN TO INT-EXT BILARY DRAIN; IR BILIARY DRAIN PLACEMENT W/ CHOLANGIOGRAM  FLUOROSCOPY TIME:  22 minutes and 54 seconds  MEDICATIONS AND MEDICAL HISTORY: Versed 2 mg, Fentanyl 100 mcg.  Additional Medications: 3.75 g of Zosyn.  ANESTHESIA/SEDATION: Moderate sedation time: 55 minutes  CONTRAST:  40 cc Omnipaque 300  PROCEDURE: The procedure, risks, benefits, and alternatives were explained to the patient. Questions regarding the procedure were encouraged and answered. The patient understands and consents to the procedure.  The abdomen and right flank were prepped with Betadine in a sterile fashion, and a sterile drape was applied covering the operative field. A sterile gown and sterile gloves were used for the procedure.  The right biliary drain was exchanged over a Bentson wire for an 8 [place] sheath. Sheath cholangiogram was performed. A Kumpe the catheter was advanced over the Bentson wire to the central biliary tree. Exhaustive attempts were made to advance the catheter across the obstruction into the common bile duct. Wires utilized included roadrunner an glide wires. Catheters utilized included a copy catheter, cobra catheter, and Sos Omni catheter.  Under sonographic guidance, a 21 gauge needle was inserted into a peripheral left duct. This was removed over a 018 wire which was up sized to a 3 J. a Kumpe the catheter was easily advanced over the J-wire, across the obstruction and into the common bile duct. This was advanced over a glidewire into the duodenum. It was exchanged over an Amplatz wire for a 10 [place] biliary drain. Several additional sideholes were cut above the marker. The right biliary  sheath was exchanged for a [place] strain. This was advanced in the central biliary tree. Contrast was injected into both catheters. A sample was sent for cytology.  FINDINGS: Sheath cholangiography confirms central biliary obstruction.  Subsequent images demonstrate placement of a left internal external biliary drain with its tip in the duodenum. The right external biliary drain is coiled in the central biliary tree.  COMPLICATIONS: None  IMPRESSION: Successful left internal external biliary drain with passage across the obstruction. The right biliary drain could not be passed across the obstruction secondary to suboptimal geometry. The patient will return next week for an additional right biliary drain.   Electronically Signed   By: Marybelle Killings M.D.   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  Initial right-sided percutaneous transhepatic externalized drain was placed 04/09/2015. Subsequently a left-sided internal/ external biliary drain was successfully placed  04/13/2015.  The patient has had persistent hyperbilirubinemia, with a total bili over 20 on today's date.  She denies any symptoms of fevers rigors or chills. She is presenting today for drain exchange.  EXAM: PERCUTANEOUS TRANSHEPATIC CHOLANGIOGRAM THROUGH EXISTING TUBE OF THE RIGHT AND LEFT BILIARY SYSTEM.  EXCHANGE OF LEFT-SIDED PERCUTANEOUS TRANSHEPATIC INTERNAL/EXTERNAL BILIARY DRAIN.  REMOVAL OF RIGHT-SIDED EXTERNALIZED DRAIN.  FLUOROSCOPY TIME:  2 minutes 12 seconds  TECHNIQUE: The procedure, risks, benefits, and alternatives were explained to the patient and the patient's daughter who serves as a Optometrist. Questions regarding the procedure were encouraged and answered. The patient understands and consents to the procedure.  Patient is  positioned supine position on the fluoroscopy table.  Patient is prepped and draped in the usual sterile fashion including sterile drape and a Betadine prep, with inclusion of both the right and left pre-existing drains. 1% lidocaine was used for local anesthesia.  Scout image was acquired.  Infiltration of the skin and subcutaneous tissues surrounding both the left and right drains at the skin was performed for local anesthesia.  The left drain was first injected gently with multiple images acquired in multiple obliquities. The catheter was ligated and a Bentson wire was advanced through the catheter into the duodenum. This catheter was then removed and exchanged for a new 10 French catheter.  The right-sided drain was then studied with gentle contrast injection and images in multiple obliquities. Catheter was ligated and a Bentson wire was passed to the drain. This drain was removed, and an 8 [place] sheath was passed into the tract. Several more injections were performed confirming communication of the right-sided than left-sided ducts. This externalized drain was then removed.  Patient tolerated the procedure well and remained hemodynamically stable throughout.  No complications were encountered and no significant blood loss was encountered.  Slide preparation was performed.  Final image was stored after biopsy.  Patient tolerated the procedure well and remained hemodynamically stable throughout.  No complications were encountered and no significant blood loss was encountered  .  FINDINGS: Images during the case demonstrate mild dilation of the biliary system, with partially patent indwelling left-sided internal/external drain.  Communication of right and left sided ducts is evident at the liver hilum, draining via the left-sided internal/external drain.  The occlusion of the biliary system is at the very proximal common hepatic duct, above the cystic duct inflow, with opacification of the gallbladder.   IMPRESSION: Status post through the tube cholangiogram demonstrating communication of left and right-sided ducts, with subsequent exchange of 10 French left-sided internal/external biliary drain terminating in the duodenum.  Status post removal of right-sided externalized drain.  Signed,  Dulcy Fanny. Earleen Newport, DO  [facility]  [facility]   Electronically Signed   By: Corrie Mckusick D.O.   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  Initial right-sided percutaneous transhepatic externalized drain was placed 04/09/2015. Subsequently a left-sided internal/ external biliary drain was successfully placed 04/13/2015.  The patient has had persistent hyperbilirubinemia, with a total bili over 20 on today's date.  She denies any symptoms of fevers rigors or chills. She is presenting today for drain exchange.  EXAM: PERCUTANEOUS TRANSHEPATIC CHOLANGIOGRAM THROUGH EXISTING TUBE OF THE RIGHT AND LEFT BILIARY SYSTEM.  EXCHANGE OF LEFT-SIDED PERCUTANEOUS TRANSHEPATIC INTERNAL/EXTERNAL BILIARY DRAIN.  REMOVAL OF RIGHT-SIDED EXTERNALIZED DRAIN.  FLUOROSCOPY TIME:  2 minutes 12 seconds  TECHNIQUE: The procedure, risks, benefits, and alternatives were explained to the patient and the patient's daughter who serves as a Optometrist. Questions regarding the procedure were encouraged and answered. The patient understands and consents to the procedure.  Patient is positioned supine position on the fluoroscopy table.  Patient is prepped and draped in the usual sterile fashion including sterile drape and a Betadine prep, with inclusion of both the right and left pre-existing drains. 1% lidocaine was used for local anesthesia.  Scout image was acquired.  Infiltration of the skin and subcutaneous tissues  surrounding both the left and right drains at the skin was performed for local anesthesia.  The left drain was first injected gently with multiple images acquired in multiple obliquities. The catheter was ligated and a Bentson wire was advanced through the catheter into the duodenum. This catheter was then removed and exchanged for a new 10 French catheter.  The right-sided drain was then studied with gentle contrast injection and images in multiple obliquities. Catheter was ligated and a Bentson wire was passed to the drain. This drain was removed, and an 8 [place] sheath was passed into the tract. Several more injections were performed confirming communication of the right-sided than left-sided ducts. This externalized drain was then removed.  Patient tolerated the procedure well and remained hemodynamically stable throughout.  No complications were encountered and no significant blood loss was encountered.  Slide preparation was performed.  Final image was stored after biopsy.  Patient tolerated the procedure well and remained hemodynamically stable throughout.  No complications were encountered and no significant blood loss was encountered  .  FINDINGS: Images during the case demonstrate mild dilation of the biliary system, with partially patent indwelling left-sided internal/external drain.  Communication of right and left sided ducts is evident at the liver hilum, draining via the left-sided internal/external drain.  The occlusion of the biliary system is at the very proximal common hepatic duct, above the cystic duct inflow, with opacification of the gallbladder.  IMPRESSION: Status post through the tube cholangiogram demonstrating communication of left and right-sided ducts, with subsequent exchange of 10 French left-sided internal/external biliary drain terminating in the duodenum.  Status post removal of right-sided externalized drain.  Signed,  Dulcy Fanny. Earleen Newport, DO  [facility]  [facility]   Electronically Signed   By: Corrie Mckusick D.O.   On: 04/25/2015 18:01   Ir Endoluminal Bx Of Biliary Tree  04/09/2015   CLINICAL DATA:  81 year old female presents with jaundice, hyperbilirubinemia, and imaging diagnosis of hilar tumor in the liver. CT demonstrates intrahepatic biliary ductal dilatation.  She has been referred for percutaneous transhepatic cholangiogram and drainage.  EXAM: IR BILIARY DRAIN PLACEMENT W/ CHOLANGIOGRAM; IR ENDOLUMNICAL BIOPSY OF BILIARY TREE  COMPARISON:  CT 04/07/2015  ANESTHESIA/SEDATION: 4.0 IV Versed; 2 high mcg IV Fentanyl.  Total Moderate Sedation Time: 45 minutes.  CONTRAST:  1mL OMNIPAQUE IOHEXOL 300 MG/ML  SOLN  MEDICATIONS: Receiving Zosyn  Procedural  FLUOROSCOPY TIME:  18 minutes 36 second  TECHNIQUE: The procedure, risks, benefits, and alternatives were explained to the patient and the patient's family. A complete informed consent was performed, with risk benefit analysis. Specific risks that were discussed for the procedure include bleeding, infection, biliary sepsis, IC use day, organ injury, need for further procedure, need for further surgery, long-term drain placement, cardiopulmonary collapse, death. Questions regarding the procedure were encouraged and answered. The patient understands and consents to the  procedure.  Patient is position in supine position on the fluoroscopy table, and the upper abdomen was prepped and draped in the usual sterile fashion. Maximum barrier sterile technique with sterile gowns and gloves were used for the procedure. A timeout was performed prior to the initiation of the procedure. Local anesthesia was provided with 1% lidocaine with epinephrine.  Ultrasound survey of the right liver lobe was performed. Window into the right biliary system was present for the right liver approach.  1% lidocaine was used for local anesthesia, with generous infiltration of the skin and subcutaneous tissues in and  inter left costal location. A Chiba needle was advanced under ultrasound guidance into the right liver lobe.  Once the tip of the needle was confirmed within the biliary system by injecting small aliquots of contrast, images were stored of the biliary system after partially opacifying the biliary tree via the needle.  Once the tip of this needle was confirmed within the biliary system, an 018 wire was advanced centrally. The needle was removed, a small incision was made with an 11 blade scalpel.  An Accustick system would not advance into the biliary system, and [place] a micropuncture system was advanced over the 018 wire into the biliary tree. The inner dilator and wire were removed, and an 035 catheter was advanced into the biliary system. A standard 6 French sheath was then placed in the biliary system.  Cholangiogram was then performed.  A combination of Glidewire, 4 [place] glide cath, 5 [place] cobra catheter, as well as a 6 [place] beacon tip 35 cm sheath used to attempt to navigate through the obstruction with no success.  A standard pigtail 10 French drainage catheter was placed over the [employer].  We confirmed placement with contrast infusion.  The patient tolerated the procedure well and remained hemodynamically stable throughout.  No complications were encountered and no significant blood loss was encountered.  COMPLICATIONS: None  FINDINGS: Ultrasound survey demonstrates dilated intrahepatic biliary ducts. Approach into the right liver lobe was identified.  Percutaneous transhepatic cholangiogram demonstrates moderate to severely dilated intrahepatic biliary ducts. There is communication of the left and right-sided ducts, and the os a right and left drain may not be required for drainage.  Dilated ducts of the segment 4B should not be confused for common hepatic duct. Common hepatic duct appears to be closer to the patient's right side.  Small amount of contrast did opacified I the common hepatic duct and  traversed the common bile duct into the duodenum. Cystic duct opacified with incomplete filling of the gallbladder.  Unable to traverse the obstruction from our initial puncture.  IMPRESSION: Status post percutaneous transhepatic cholangiogram with demonstration of high-grade stenosis of the common hepatic duct, and subsequent placement of externalized standard 10 French pigtail catheter drain.  Communication of left and right-sided ducts was demonstrated.  Signed,  Dulcy Fanny. Earleen Newport, DO  [facility]  [facility]  PLAN: Would recommend repeat through the tube cholangiogram on Monday or Tuesday of the upcoming week for drainage catheter exchange and a second attempt at traversing the obstruction.  Twice a day 10 cc saline flushes until drain exchange.   Electronically Signed   By: Corrie Mckusick D.O.   On: 04/09/2015 13:51    Labs:  CBC:  Recent Labs  04/14/15 0519 04/25/15 0953 05/07/2015 2005 05/03/15 0350  WBC 9.0 12.4* 14.6* 14.0*  HGB 13.9 14.8 16.0* 14.7  HCT 40.8 41.1 44.4 41.8  PLT 208 174 232  173    COAGS:  Recent Labs  04/06/15 1405  04/07/15 0523 04/08/15 0720 04/08/15 1316 04/12/15 0546 04/25/15 0953  INR 1.44  < > 1.47 1.59*  --  1.46 1.65*  APTT 30  --   --   --  34 33  --   < > = values in this interval not displayed.  BMP:  Recent Labs  04/15/15 0530 04/25/15 0953 05/24/2015 2005 05/29/2015 2111 05/03/15 0350  NA 134* 131* 132*  --  137  K 4.4 4.9 6.4* 6.7* 5.7*  CL 104 104 103  --  108  CO2 22 18* 9*  --  10*  GLUCOSE 93 124* 144*  --  141*  BUN 17 32* 132*  --  124*  CALCIUM 7.4* 8.8* 7.8*  --  7.6*  CREATININE 0.80 1.21* 3.85*  --  3.65*  GFRNONAA >60 40* 10*  --  11*  GFRAA >60 47* 12*  --  12*    LIVER FUNCTION TESTS:  Recent Labs  04/15/15 0530 04/25/15 0953 05/11/2015 2005 05/03/15 0350  BILITOT 14.1* 23.9* 26.6* 22.0*  AST 72* 83* 107* 132*  ALT 35 35 48 53  ALKPHOS 143* 161* 210* 170*  PROT  5.9* 6.7 6.9 5.9*  ALBUMIN 1.8* 2.1* 2.5* 2.1*    TUMOR MARKERS:  Recent Labs  04/07/15 1615 04/15/15 0530 04/15/15 1834  AFPTM 46.6*  --   --   CEA  --   --  5.4*  CA199  --  734*  --     Assessment and Plan: Biliary obstruction secondary to tumor, brush biopsy x 2 - non diagnostic, elevated AFP  S/p right external biliary drain placed 04/09/15 S/p Left internal/external biliary drain placed 04/14/15 S/p cholangiogram with exchange of left sided internal/external drain and removal of right external biliary drain 04/25/15 Admitted with weakness and change in color of biliary drainage T. Bili 26.6 10/3, today slowly trend down, wbc up Palliative to meet with family for goals of care Discussed case with Dr. Pascal Lux and IR could offer cholangiogram and possible exchange of left biliary drain, could even discuss possible new right sided biliary drain-depending on goals   IR will wait for [place] meeting    Signed: Hedy Jacob 05/03/2015, 1:00 PM

## 2015-05-03 NOTE — Progress Notes (Signed)
Patient ID: Diana Pearson, female   DOB: 25-Oct-1928, 81 y.o.   MRN: [id number]   Patient admitted after midnight. Please refer to admission note done 05/03/2015 for details.   81 y.o. female with past medical history of hypertension, hypothyroidism, atrial fibrillation (not on anticoagulation), hepatitis C, recent hospitalization for cholestatic jaundice and found to have liver mass ([place] tumor more so than liver cancer), has has percutaneous transhepatic cholangiogram with external biliary drain placement. She was referred to [facility] surgery for resection of the mass but she never followed up due to ongoing weakness.  She has felt poor since past week or so with very poor PO intake and extreme weakness to the point that she is almost bedbound.  On admission, CT abd demonstrated Intact appearances of the biliary drain, with satisfactory position. No acute findings. She has had potassium of 6.4 and 6.7 which improved to 5.7. Craetinine was 3.85 (most recent Cr about 8 days ago was 1.21).   Physical Exam  Constitutional: Appears malnourished, pale, weak CVS: RRR, S1/S2 appreciated  Pulmonary: Effort and breath sounds normal, no stridor, rhonchi, wheezes, rales.  Abdominal: Soft. BS +,  no distension, tenderness, rebound or guarding.  Musculoskeletal: No edema and no tenderness.  Neuro: No focal deficits  Assessment and Plan:  Generalized weakness  - Likely in the setting of multiple issues including but not limited acute renal failure, dehydration and hyperkalemia - At this time, family prefers to talk to palliative care; will follow up on recommendations - Obtain PT evaluation if pt able to participate.  Hyperkalemia - Repeat BMP stat - Current potassium 5.7  Acute renal failure - Likely due to dehydration - Continue IV fluids - Follow up renal function in am  Leisa Lenz [place]

## 2015-05-03 NOTE — Procedures (Signed)
Technically successful fluoro guided exchange and upsizing to a 12 Fr left hepatic approach biliary drain with end coiled and locked within the duodenum. Biliary drain connected to gravity bag. No immediate post procedural complications.  PLAN: - would recommend obtaining serial CMPs to see if bilirubin level trend downward -> if no improvement following drain upsizing, pt may ultimately need placement of an additional right sided approach biliary drainage catheter.  Ronny Bacon, MD Pager #: [phone]

## 2015-05-03 NOTE — Progress Notes (Signed)
Fentanyl 50 mcg and Versed 1 mg given IVP for IR procedure. Narcan 0.4 mg given IVP post procedure for RR 7, Decreased LOC. Good results pt alert and with eyes open. Taken to 1427 as per order.

## 2015-05-03 NOTE — Consult Note (Signed)
Consultation Note Date: 05/03/2015   Patient Name: Diana Pearson  DOB: [birth year]  MRN: [id number]  Age / Sex: 81 y.o., female   PCP: [facility] Referring Physician: Robbie Lis, MD  Reason for Consultation: Establishing goals of care, Non pain symptom management, Pain control and Psychosocial/spiritual support  Palliative Care Assessment and Plan Summary of Established Goals of Care and Medical Treatment Preferences    Palliative Care Discussion Held Today:    This NP Wadie Lessen reviewed medical records, received report from team, assessed the patient and then meet at the patient's bedside along with her daughter Diana Pearson   to discuss diagnosis, prognosis, GOC,  and options.  Patient's daughter shared her experience regarding her mother's medical situation over the past several months.  She voices her concerns and guilt over "I never should have let her take that medicine for Hep C".  Daughter states that patient was in good condition up until she started to take "it", she verbalizes thoughts and feelings that her mother was not seen "fast enough" when she called the clinic.     Emotional support offered and allowed space to discuss other medical conditions (severe aortic stenosis) and concept of mortality.  The difference between a aggressive medical intervention path  and a palliative comfort care path for this patient at this time was had.  Limitations of medical interventions was had. Values and goals of care important to patient and family were attempted to be elicited.  Concept of Palliative Care was discussed   Questions and concerns addressed.  Family encouraged to call with questions or concerns.  PMT will continue to support holistically.  Primary Decision Maker: daughter  Goals of Care/Code Status/Advance Care Planning:   Treat the treatable, family is open to all available medical interventions to prolong life.  Family remain hopeful for  improvement    Code Status: DNR-previously documented    Psycho-social/Spiritual:    Support System: family        Chief Complaint: weakness, skin discoloration  History of Present Illness:    Diana Pearson is a 81 y.o. female   who was recently admitted last month for obstructive jaundice with CT abdomen showing abdominal mass and patient had biliary drain placed was brought to the ER after patient was having poor appetite and weakness. As per patient's daughter patient has been doing very poorly over the last 1 week and over the last 2 days patient is unable to even walk. Has eaten anything over the last 2 days. Denies any nausea vomiting diarrhea fever chills or abdominal pain. Labs revealed acute renal failure with hyperkalemia with worsening bilirubin levels. Patient had recently been admitted and had extensive workup done with biopsies done were unrevealing. Patient was eventually referred to [facility] but was unable to keep the appointment due to weakness.   PMH for severe arortic stenosis  She is s/p right external biliary drain placed 04/09/15. S/p Left internal/external biliary drain placed 04/14/15 and S/p cholangiogram with exchange of left sided internal/external drain and removal of right external biliary drain on 04/25/15. She was discharged and presented to ED on 10/3, now admitted with weakness, decreased oral intake and change in biliary drainage-darker in color.      Primary Diagnoses  Present on Admission:  . Hyperkalemia . Chronic hepatitis C without hepatic coma ([place]) . Hypothyroidism . Chronic atrial fibrillation ([place]) . Acute renal failure (ARF) (HCC)  Palliative Review of Systems:    -through daughter's interpretation, denies  pain and nausea.     I have reviewed the medical record, interviewed the patient and family, and examined the patient. The following aspects are pertinent.  Past Medical History  Diagnosis Date  . Hypothyroidism   . Osteoporosis   .  Atrial fibrillation (HCC)     no anticoagulation.   . Jaundice 04/06/2015    Sudden onset, 10 days following initiation of Zepatier for Hep C.   . Abdominal distention 04/06/2015  . Aortic stenosis 10/2014    by echo.   . Hypercholesterolemia   . History of blood transfusion ~ 2006    "related to bleeding stomach ulcer"  . Bleeding stomach ulcer ~ 2006  . Hepatitis C ~ 2006    needle stick in 1970s  . Cancer [facility])    Social History   Social History  . Marital Status: Widowed    Spouse Name: N/A  . Number of Children: N/A  . Years of Education: N/A   Social History Main Topics  . Smoking status: Former Smoker -- 0.12 packs/day for 10 years    Types: Cigarettes  . Smokeless tobacco: Never Used     Comment: "quit smoking in the 1990's"  . Alcohol Use: No  . Drug Use: No  . Sexual Activity: No   Other Topics Concern  . None   Social History Narrative   Family History  Problem Relation Age of Onset  . Stroke Mother    Scheduled Meds: . enoxaparin (LOVENOX) injection  30 mg Subcutaneous Q24H  . levothyroxine  44 mcg Intravenous Daily  . metoprolol  2.5 mg Intravenous Q12H   Continuous Infusions: . sodium chloride 75 mL/hr at 05/03/15 1324   PRN Meds:.acetaminophen **OR** acetaminophen, ondansetron **OR** ondansetron (ZOFRAN) IV Medications Prior to Admission:  Prior to Admission medications   Medication Sig Start Date End Date Taking? Authorizing Provider  levothyroxine (SYNTHROID, LEVOTHROID) 88 MCG tablet Take 1 tablet (88 mcg total) by mouth daily before breakfast. 04/15/15  Yes Albertine Patricia, MD  metoprolol tartrate (LOPRESSOR) 25 MG tablet Take 0.5 tablets (12.5 mg total) by mouth 2 (two) times daily. 04/15/15  Yes Silver Huguenin Elgergawy, MD  ondansetron (ZOFRAN) 4 MG tablet Take 1 tablet (4 mg total) by mouth every 6 (six) hours as needed for nausea. 04/15/15  Yes Albertine Patricia, MD  OVER THE COUNTER MEDICATION Take 1 capsule by mouth 4 (four) times daily. Biofilm  Detox   Yes Historical Provider, MD  OVER THE COUNTER MEDICATION Take 1 capsule by mouth 4 (four) times daily. Homeopathic supplement   Yes Historical Provider, MD  OVER THE COUNTER MEDICATION Take 1 capsule by mouth 2 (two) times daily. ATP Max   Yes Historical Provider, MD  OVER THE COUNTER MEDICATION Take 10 drops by mouth daily. Chelidonium   Yes Historical Provider, MD  oxyCODONE (ROXICODONE) 5 MG immediate release tablet Take 1 tablet (5 mg total) by mouth every 6 (six) hours as needed for severe pain. 04/15/15  Yes Albertine Patricia, MD  TURMERIC PO Take 1 capsule by mouth daily.   Yes Historical Provider, MD  feeding supplement, ENSURE ENLIVE, (ENSURE ENLIVE) LIQD Take 237 mLs by mouth 2 (two) times daily between meals. Patient not taking: Reported on 04/22/2015 04/15/15   Silver Huguenin Elgergawy, MD   No Known Allergies CBC:    Component Value Date/Time   WBC 14.0* 05/03/2015 0350   HGB 14.7 05/03/2015 0350   HCT 41.8 05/03/2015 0350   PLT 173 05/03/2015 0350  MCV 90.1 05/03/2015 0350   NEUTROABS 12.3* 05/03/2015 0350   LYMPHSABS 0.6* 05/03/2015 0350   MONOABS 1.1* 05/03/2015 0350   EOSABS 0.0 05/03/2015 0350   BASOSABS 0.0 05/03/2015 0350   Comprehensive Metabolic Panel:    Component Value Date/Time   NA 137 05/03/2015 0350   K 5.7* 05/03/2015 0350   CL 108 05/03/2015 0350   CO2 10* 05/03/2015 0350   BUN 124* 05/03/2015 0350   CREATININE 3.65* 05/03/2015 0350   GLUCOSE 141* 05/03/2015 0350   CALCIUM 7.6* 05/03/2015 0350   AST 132* 05/03/2015 0350   ALT 53 05/03/2015 0350   ALKPHOS 170* 05/03/2015 0350   BILITOT 22.0* 05/03/2015 0350   PROT 5.9* 05/03/2015 0350   ALBUMIN 2.1* 05/03/2015 0350    Physical Exam:  Vital Signs: BP 142/44 mmHg  Pulse 66  Temp(Src) 97.6 F (36.4 C) (Oral)  Resp 14  Ht 4\' 6"  (1.372 m)  Wt 46.1 kg (101 lb 10.1 oz)  BMI 24.49 kg/m2  SpO2 100% SpO2: SpO2: 100 % O2 Device: O2 Device: Not Delivered O2 Flow Rate:   Intake/output summary:   Intake/Output Summary (Last 24 hours) at 05/03/15 1428 Last data filed at 05/03/15 0700  Gross per 24 hour  Intake   2050 ml  Output      0 ml  Net   2050 ml   LBM:   Baseline Weight: Weight: 46.1 kg (101 lb 10.1 oz) Most recent weight: Weight: 46.1 kg (101 lb 10.1 oz)  Exam Findings:    General:  Ill appearing,  thin and frail appearing  Eyes: Icterus present.  ENT: dry buccal membranes  Cardiovascular:  RRR  Respiratory: No rhonchi or crepitations, decreased in bases  Abdomen: NT on gentle palpation  Skin: juandice  Neurologic:  follows commands through daughters interpretation, lethargic            Palliative Performance Scale: 30 % at best                Additional Data Reviewed: Recent Labs     05/18/2015  2005  05/03/15  0350  WBC  14.6*  14.0*  HGB  16.0*  14.7  PLT  232  173  NA  132*  137  BUN  132*  124*  CREATININE  3.85*  3.65*     Time In: 1330 Time Out: 1445 Time Total: 75 min  Greater than 50%  of this time was spent counseling and coordinating care related to the above assessment and plan.    Signed by: Wadie Lessen, NP  Knox Royalty, NP  05/03/2015, 2:28 PM  Please contact Palliative Medicine Team phone at [phone] for questions and concerns.   See AMION for contact information

## 2015-05-03 NOTE — Progress Notes (Signed)
Patient transferred to unit after biliary drain exchange. Patient alert. No change from initial am assessment. Will continue to monitor and follow the POC.

## 2015-05-04 ENCOUNTER — Other Ambulatory Visit: Payer: No Typology Code available for payment source

## 2015-05-04 DIAGNOSIS — N289 Disorder of kidney and ureter, unspecified: Secondary | ICD-10-CM

## 2015-05-04 DIAGNOSIS — E872 Acidosis, unspecified: Secondary | ICD-10-CM | POA: Insufficient documentation

## 2015-05-04 DIAGNOSIS — Z515 Encounter for palliative care: Secondary | ICD-10-CM

## 2015-05-04 DIAGNOSIS — N179 Acute kidney failure, unspecified: Principal | ICD-10-CM

## 2015-05-04 DIAGNOSIS — E039 Hypothyroidism, unspecified: Secondary | ICD-10-CM

## 2015-05-04 DIAGNOSIS — E43 Unspecified severe protein-calorie malnutrition: Secondary | ICD-10-CM

## 2015-05-04 DIAGNOSIS — B1920 Unspecified viral hepatitis C without hepatic coma: Secondary | ICD-10-CM

## 2015-05-04 DIAGNOSIS — K831 Obstruction of bile duct: Secondary | ICD-10-CM | POA: Insufficient documentation

## 2015-05-04 DIAGNOSIS — I482 Chronic atrial fibrillation: Secondary | ICD-10-CM

## 2015-05-04 DIAGNOSIS — K838 Other specified diseases of biliary tract: Secondary | ICD-10-CM

## 2015-05-04 DIAGNOSIS — R109 Unspecified abdominal pain: Secondary | ICD-10-CM | POA: Insufficient documentation

## 2015-05-04 DIAGNOSIS — R531 Weakness: Secondary | ICD-10-CM

## 2015-05-04 DIAGNOSIS — E875 Hyperkalemia: Secondary | ICD-10-CM

## 2015-05-04 LAB — BASIC METABOLIC PANEL
Anion gap: 14 (ref 5–15)
BUN: 126 mg/dL — AB (ref 6–20)
CHLORIDE: 118 mmol/L — AB (ref 101–111)
CO2: 9 mmol/L — AB (ref 22–32)
CREATININE: 2.33 mg/dL — AB (ref 0.44–1.00)
Calcium: 6.9 mg/dL — ABNORMAL LOW (ref 8.9–10.3)
GFR calc Af Amer: 21 mL/min — ABNORMAL LOW (ref >60)
GFR calc non Af Amer: 18 mL/min — ABNORMAL LOW (ref >60)
Glucose, Bld: 114 mg/dL — ABNORMAL HIGH (ref 65–99)
POTASSIUM: 6.7 mmol/L — AB (ref 3.5–5.1)
Sodium: 141 mmol/L (ref 135–145)

## 2015-05-04 LAB — COMPREHENSIVE METABOLIC PANEL
ALK PHOS: 176 U/L — AB (ref 38–126)
ALT: 57 U/L — AB (ref 14–54)
AST: 151 U/L — AB (ref 15–41)
Albumin: 1.9 g/dL — ABNORMAL LOW (ref 3.5–5.0)
Anion gap: 11 (ref 5–15)
BUN: 124 mg/dL — AB (ref 6–20)
CALCIUM: 7.3 mg/dL — AB (ref 8.9–10.3)
CHLORIDE: 120 mmol/L — AB (ref 101–111)
CO2: 10 mmol/L — AB (ref 22–32)
CREATININE: 2.46 mg/dL — AB (ref 0.44–1.00)
GFR, EST AFRICAN AMERICAN: 20 mL/min — AB (ref >60)
GFR, EST NON AFRICAN AMERICAN: 17 mL/min — AB (ref >60)
Glucose, Bld: 119 mg/dL — ABNORMAL HIGH (ref 65–99)
Potassium: 5 mmol/L (ref 3.5–5.1)
Sodium: 141 mmol/L (ref 135–145)
Total Bilirubin: 22.9 mg/dL (ref 0.3–1.2)
Total Protein: 5.5 g/dL — ABNORMAL LOW (ref 6.5–8.1)

## 2015-05-04 LAB — GLUCOSE, CAPILLARY
GLUCOSE-CAPILLARY: 101 mg/dL — AB (ref 65–99)
GLUCOSE-CAPILLARY: 96 mg/dL (ref 65–99)
Glucose-Capillary: 198 mg/dL — ABNORMAL HIGH (ref 65–99)
Glucose-Capillary: 85 mg/dL (ref 65–99)

## 2015-05-04 LAB — AMMONIA: AMMONIA: 94 umol/L — AB (ref 9–35)

## 2015-05-04 MED ORDER — DEXTROSE 50 % IV SOLN
1.0000 | Freq: Once | INTRAVENOUS | Status: AC
  Administered 2015-05-04: 50 mL via INTRAVENOUS
  Filled 2015-05-04: qty 50

## 2015-05-04 MED ORDER — FENTANYL CITRATE (PF) 100 MCG/2ML IJ SOLN
12.5000 ug | INTRAMUSCULAR | Status: DC | PRN
Start: 2015-05-04 — End: 2015-05-09
  Administered 2015-05-04 – 2015-05-08 (×15): 12.5 ug via INTRAVENOUS
  Filled 2015-05-04 (×15): qty 2

## 2015-05-04 MED ORDER — INSULIN ASPART 100 UNIT/ML ~~LOC~~ SOLN
10.0000 [IU] | Freq: Once | SUBCUTANEOUS | Status: AC
  Administered 2015-05-04: 10 [IU] via SUBCUTANEOUS

## 2015-05-04 MED ORDER — METOPROLOL TARTRATE 1 MG/ML IV SOLN
2.5000 mg | Freq: Two times a day (BID) | INTRAVENOUS | Status: DC
  Administered 2015-05-04 – 2015-05-06 (×3): 2.5 mg via INTRAVENOUS
  Filled 2015-05-04 (×6): qty 5

## 2015-05-04 MED ORDER — ALBUTEROL SULFATE (2.5 MG/3ML) 0.083% IN NEBU
2.5000 mg | INHALATION_SOLUTION | Freq: Once | RESPIRATORY_TRACT | Status: AC
  Administered 2015-05-04: 2.5 mg via RESPIRATORY_TRACT
  Filled 2015-05-04: qty 3

## 2015-05-04 MED ORDER — SODIUM CHLORIDE 0.9 % IV SOLN
1.0000 g | Freq: Once | INTRAVENOUS | Status: AC
  Administered 2015-05-04: 1 g via INTRAVENOUS
  Filled 2015-05-04: qty 10

## 2015-05-04 MED ORDER — STERILE WATER FOR INJECTION IV SOLN
INTRAVENOUS | Status: DC
  Administered 2015-05-04 – 2015-05-07 (×5): via INTRAVENOUS
  Filled 2015-05-04 (×12): qty 850

## 2015-05-04 MED ORDER — SODIUM POLYSTYRENE SULFONATE 15 GM/60ML PO SUSP
45.0000 g | Freq: Once | ORAL | Status: AC
  Administered 2015-05-04: 45 g via RECTAL
  Filled 2015-05-04: qty 180

## 2015-05-04 NOTE — Progress Notes (Signed)
Patient ID: Diana Pearson, female   DOB: Nov 03, 1928, 81 y.o.   MRN: [id number]  [facility] PROGRESS NOTE  Diana Pearson GGE:[id number] DOB: 03/22/1929 DOA: 05/30/2015 PCP: [facility]  Assessment/Plan: Generalized weakness/active declining and severe protein calorie malnutrition  - Likely in the setting of multiple issues including but not limited acute renal failure, dehydration and hyperkalemia - At this time, family prefers to talk to palliative care; will follow up on recommendations for [place] -she has remained to be full code -is obtunded and unable to eat or drink -Obtain PT evaluation when pt able to participate.  Hyperkalemia - Current potassium 6.7 -will change IVF's to bicarb drip -will give albuterol X 1, IV insulin and kayexalate enema -patient also received calcium gluconate to stabilize cardiac membrane -will follow electrolytes   Acute renal failure - secondary to dehydration and pre-renal azotemia - Continue IV fluids, but will change to bicarbonate drip given degree of metabolic acidosis - Follow up renal function trend -Cr improving   Severe protein calorie malnutrition -patient unable to eat or drink properly given degree of obtundation  -palliative is on board and will addressed GOA, invasive/artificial nutrition and decision of further care  Obstructive Jaundice/liver mass -due to ongoing cirrhosis with biliary obstruction and elevated bilirubin -will follow IR rec's -patient is frail, but patient's family optimistic about procedures that can be done to help patient in acute setting -status post exchange biliary drain catheter (upsizing to a 12 french catheter)    -will ask GI for inputs and rec's  Hx of severe aortic stenosis  Will follow balance as she is on IV fluids No signs of fluid overload at this moment  Hx of atrial fibrillation Not a candidate for anticoagulation CHADsVASC score 3  Hypothyroidism Will continue synthroid     Code Status: Full Code Family Communication: daughter at bedside Disposition Plan: remains inpatient, will follow electrolytes response and response to her bilirubin level   Consultants:  ID  IR  Oncology service  Palliative Care  GI  Procedures:  up sizing percutaneous biliary catheter 05/04/15    Antibiotics:  None   HPI/Subjective: Afebrile, obtunded and not able to eat or drink safely at this moment.  Objective: Filed Vitals:   05/04/15 1517  BP:   Pulse: 73  Temp:   Resp: 24    Intake/Output Summary (Last 24 hours) at 05/04/15 1642 Last data filed at 05/04/15 0700  Gross per 24 hour  Intake   1800 ml  Output     50 ml  Net   1750 ml   Filed Weights   05/24/2015 2319 05/04/15 0531  Weight: 46.1 kg (101 lb 10.1 oz) 47.809 kg (105 lb 6.4 oz)    Exam: General: Appears malnourished, jaundiced, weak and answering with yes or no intermittently  CVS: RRR, S1/S2 appreciated  Pulmonary: Effort and breath sounds normal, no stridor, rhonchi, wheezes, rales.  Abdominal: Soft. BS +,  no distension, tenderness, rebound or guarding; percutaneous drain in place  Musculoskeletal: No edema and no tenderness.  Neuro: No focal deficits  Data Reviewed: Basic Metabolic Panel:  Recent Labs Lab 05/28/2015 2005 05/10/2015 2111 05/03/15 0350 05/03/15 1930 05/04/15 0906 05/04/15 1450  NA 132*  --  137 136 141 141  K 6.4* 6.7* 5.7* 5.7* 6.7* 5.0  CL 103  --  108 113* 118* 120*  CO2 9*  --  10* 12* 9* 10*  GLUCOSE 144*  --  141* 106* 114* 119*  BUN 132*  --  124* 124* 126* 124*  CREATININE 3.85*  --  3.65* 3.20* 2.33* 2.46*  CALCIUM 7.8*  --  7.6* 7.0* 6.9* 7.3*   Liver Function Tests:  Recent Labs Lab 05/20/2015 2005 05/03/15 0350 05/04/15 1450  AST 107* 132* 151*  ALT 48 53 57*  ALKPHOS 210* 170* 176*  BILITOT 26.6* 22.0* 22.9*  PROT 6.9 5.9* 5.5*  ALBUMIN 2.5* 2.1* 1.9*    Recent Labs Lab 05/16/2015 2005  LIPASE 56*   No results for input(s):  AMMONIA in the last 168 hours. CBC:  Recent Labs Lab 05/15/2015 2005 05/03/15 0350  WBC 14.6* 14.0*  NEUTROABS  --  12.3*  HGB 16.0* 14.7  HCT 44.4 41.8  MCV 88.3 90.1  PLT 232 173   Cardiac Enzymes: No results for input(s): CKTOTAL, CKMB, CKMBINDEX, TROPONINI in the last 168 hours. BNP (last 3 results) No results for input(s): BNP in the last 8760 hours.  ProBNP (last 3 results) No results for input(s): PROBNP in the last 8760 hours.  CBG:  Recent Labs Lab 05/03/15 1241 05/03/15 2350 05/04/15 0544 05/04/15 1207  GLUCAP 96 101* 96 198*    Recent Results (from the past 240 hour(s))  MRSA PCR Screening     Status: None   Collection Time: 05/05/2015 11:18 PM  Result Value Ref Range Status   MRSA by PCR NEGATIVE NEGATIVE Final    Comment:        The GeneXpert MRSA Assay (FDA approved for NASAL specimens only), is one component of a comprehensive MRSA colonization surveillance program. It is not intended to diagnose MRSA infection nor to guide or monitor treatment for MRSA infections.      Studies: Ct Abdomen Pelvis Wo Contrast  05/29/2015   CLINICAL DATA:  Hepatobiliary catheter drainage has become darker, with some oozing at the insertion site. Increasing weakness.  EXAM: CT ABDOMEN AND PELVIS WITHOUT CONTRAST  TECHNIQUE: Multidetector CT imaging of the abdomen and pelvis was performed following the standard protocol without IV contrast.  COMPARISON:  04/07/2015 CT. Procedural images from the transhepatic biliary interventions.  FINDINGS: The internal/external biliary drain appears satisfactorily positioned, entering the left hepatic lobe anteriorly, continuing into the central biliary system and then on down into the duodenum. The left hepatic ducts are decompressed. The right hepatic ducts are moderately distended. Extrahepatic bile ducts are decompressed. No significant fluid collection is evident along the course of the drainage catheter.  There is substantially  reduced volume of ascites, with only a small perihepatic collection.  There are unremarkable unenhanced appearances of the pancreas, spleen, adrenals and kidneys. There is no bowel obstruction. There is no extraluminal air. No focal inflammatory changes are evident in the abdomen or pelvis. Uterus and adnexal structures appear unremarkable. The abdominal aorta is normal in caliber with moderate atherosclerotic calcification. No significant abnormality is evident in the lower chest. No significant skeletal lesion is evident. There are unchanged chronic appearing compressions of L2, L1 and T10.  IMPRESSION: 1. Intact appearances of the biliary drain, with satisfactory position. 2. No abnormal collection along the course of the drain. Moderate distention of the right hepatic biliary system but the left hepatic system and extrahepatic ducts are decompressed. 3. Substantially reduced ascites volume. 4. No acute findings are evident in the abdomen or pelvis.   Electronically Signed   By: Andreas Newport M.D.   On: 05/29/2015 22:22   Ir Exchange Biliary Drain  05/04/2015   INDICATION: History of central hepatic tumor, potentially a cholangiocarcinoma or hepatocellular carcinoma  with resultant biliary obstruction.  The patient underwent a right-sided percutaneous external biliary drainage catheter placement on 04/09/2015 and subsequently a left sided internal/external biliary drainage catheter on 04/13/2015. Patient returned on 04/24/2013 for percutaneous cholangiogram with subsequent removal of the right-sided percutaneous biliary drainage catheter.  Patient now re-admitted to the hospital with persistent hyper bilirubinemia and dehydration.  Please perform percutaneous biliary drainage of cholangiogram and potential biliary drainage up sizing as clinically indicated.  EXAM: 1. CHOLANGIOGRAM VIA EXISTING PERCUTANEOUS BILIARY DRAINAGE CATHETER 2. FLUOROSCOPIC GUIDED LEFT-SIDED PERCUTANEOUS BILIARY DRAINAGE CATHETER   COMPARISON:  CT abdomen pelvis - 04/07/2015; 05/27/2015; fluoroscopic guided right-sided percutaneous biliary drainage catheter placement - 04/09/2015; ultrasound and fluoroscopic guided left-sided percutaneous biliary drainage catheter placement - 04/13/2015; cholangiogram via existing percutaneous biliary drainage catheter with subsequent right-sided biliary drainage catheter removal -04/25/2015  CONTRAST:  20 cc Omnipaque 300, administered into the biliary tree  FLUOROSCOPY TIME:  1 minute 54 seconds (16 mGy)  COMPLICATIONS: None immediate  TECHNIQUE: Informed written consent was obtained from the patient's family after a discussion of the risks, benefits and alternatives to treatment. Questions regarding the procedure were encouraged and answered. A timeout was performed prior to the initiation of the procedure.  The external portion of the existing left-sided percutaneous biliary drainage catheter as well as the surrounding skin were prepped and draped in the usual sterile fashion. A sterile drape was applied covering the operative field. Maximum barrier sterile technique with sterile gowns and gloves were used for the procedure. A timeout was performed prior to the initiation of the procedure.  A pre procedural spot fluoroscopic image was obtained of the right upper abdominal quadrant and the existing residual left-sided percutaneous biliary drainage catheter. Multiple spot fluoroscopic and radiographic images were obtained following the injection of a small amount of contrast via the existing percutaneous biliary drainage catheter. The existing biliary drainage catheter was cut and cannulated with a Amplatz wire which was coiled within the proximal small bowel. Under intermittent fluoroscopic guidance, the existing PBD was exchanged for a new slightly larger now 12 [place] PBD. Small amount of contrast was injected via the exchanged biliary drainage catheter and a post exchange spot fluoroscopic image  was obtained. The catheter was locked and secured to the skin with a single interrupted suture. The biliary drainage catheter was capped. A dressing was placed. The patient tolerated the procedure well without immediate postprocedural complication.  FINDINGS: The existing percutaneous biliary catheter is appropriately positioned and functioning, however there is no definitive opacification of the right biliary tree correlating with the apparent residual / recurrent intrahepatic biliary duct dilatation involving the right lobe of the liver on noncontrast abdominal CT obtained 05/14/2015.  After successful fluoroscopic guided exchange and up sizing, the new 12 French percutaneous biliary catheter is appropriately positioned with end coiled and locked within the proximal duodenum.  IMPRESSION: Successful fluoroscopic guided exchange and up sizing of a now 72 French percutaneous biliary drainage catheter.  PLAN: Would recommend obtaining serial bilirubin levels. If patient's bilirubin levels remain elevated, the patient may require placement of a new right-sided percutaneous biliary drainage catheter as it appears the left and right biliary trees no longer communicate.   Electronically Signed   By: Sandi Mariscal M.D.   On: 05/04/2015 11:03    Scheduled Meds: . enoxaparin (LOVENOX) injection  30 mg Subcutaneous Q24H  . levothyroxine  44 mcg Intravenous Daily  . metoprolol  2.5 mg Intravenous Q12H   Continuous Infusions: .  sodium bicarbonate 150  mEq in sterile water 1000 mL infusion 75 mL/hr at 05/04/15 1041    Principal Problem:   Acute renal failure (HCC) Active Problems:   Chronic hepatitis C without hepatic coma (HCC)   Hypothyroidism   Hyperkalemia   Chronic atrial fibrillation (HCC)   Acute renal failure (ARF) ([place])   General weakness   Palliative care encounter   Weakness generalized   Abdominal pain   Obstructive jaundice   Protein-calorie malnutrition, severe    Time spent: 30  minutes   Barton Dubois  [facility] Pager [phone]. If 7PM-7AM, please contact night-coverage at [email or web address], password [facility] 05/04/2015, 4:42 PM  LOS: 2 days

## 2015-05-04 NOTE — Progress Notes (Signed)
Daily Progress Note   Patient Name: Diana Pearson       Date: 05/04/2015 DOB: 1928-08-28  Age: 81 y.o. MRN#: [id number] Attending Physician: Barton Dubois, MD Primary Care Physician: [facility] Admit Date: 05/03/2015  Reason for Consultation/Follow-up: Establishing goals of care  Subjective:     -continued conversation regarding diagnosis, prognosis, goals of care, treatments and options  -active listening for daughter's fears and frustrations.  She clearly is asking for aggressive medical interventions to treat the treatable.   Emotional support offered   Length of Stay: 2 days  Current Medications: Scheduled Meds:  . enoxaparin (LOVENOX) injection  30 mg Subcutaneous Q24H  . levothyroxine  44 mcg Intravenous Daily  . metoprolol  2.5 mg Intravenous Q12H    Continuous Infusions: .  sodium bicarbonate 150 mEq in sterile water 1000 mL infusion 75 mL/hr at 05/04/15 1041    PRN Meds: acetaminophen **OR** acetaminophen, fentaNYL (SUBLIMAZE) injection, ondansetron **OR** ondansetron (ZOFRAN) IV  Palliative Performance Scale: 20 %     Vital Signs: BP 119/51 mmHg  Pulse 73  Temp(Src) 97.5 F (36.4 C) (Oral)  Resp 24  Ht 4\' 6"  (1.372 m)  Wt 47.809 kg (105 lb 6.4 oz)  BMI 25.40 kg/m2  SpO2 97% SpO2: SpO2: 97 % O2 Device: O2 Device: Not Delivered O2 Flow Rate: O2 Flow Rate (L/min): 2 L/min  Intake/output summary:  Intake/Output Summary (Last 24 hours) at 05/04/15 1643 Last data filed at 05/04/15 0700  Gross per 24 hour  Intake   1800 ml  Output     50 ml  Net   1750 ml   LBM:   Baseline Weight: Weight: 46.1 kg (101 lb 10.1 oz) Most recent weight: Weight: 47.809 kg (105 lb 6.4 oz)  Physical Exam:   General: Ill appearing, thin and frail appearing, minimally responsive  Eyes: Icterus present.  ENT: dry buccal membranes  Cardiovascular: RRR  Respiratory: No rhonchi or crepitations, decreased in bases  Abdomen: NT on gentle  palpation  Skin: juandice  Neurologic: follows commands through daughters interpretation, lethargic     Additional Data Reviewed: Recent Labs     05/01/2015  2005  05/03/15  0350   05/04/15  0906  05/04/15  1450  WBC  14.6*  14.0*   --    --    --   HGB  16.0*  14.7   --    --    --   PLT  232  173   --    --    --   NA  132*  137   < >  141  141  BUN  132*  124*   < >  126*  124*  CREATININE  3.85*  3.65*   < >  2.33*  2.46*   < > = values in this interval not displayed.     Problem List:  Patient Active Problem List   Diagnosis Date Noted  . Palliative care encounter 05/04/2015  . Weakness generalized 05/04/2015  . Protein-calorie malnutrition, severe 05/04/2015  . Abdominal pain   . Obstructive jaundice   . Acute renal failure ([place]) 05/03/2015  . Chronic atrial fibrillation ([place]) 05/03/2015  . Acute renal failure (ARF) ([place]) 05/03/2015  . General weakness   . Hyperkalemia 05/12/2015  . Cholangiocarcinoma ([place]) 04/15/2015  . Portal vein thrombosis   . Liver mass   . Liver failure, acute   . Jaundice 04/06/2015  . Icterus 04/06/2015  . Abdominal  distention 04/06/2015  . Acute liver failure 04/06/2015  . Nausea without vomiting 04/06/2015  . Cirrhosis ([place])   . Ovarian cyst 03/10/2015  . Osteoporosis 03/10/2015  . Hypertension 03/10/2015  . Hypothyroidism 03/10/2015  . Chronic hepatitis C without hepatic coma ([place]) 03/09/2015  . Hepatic cirrhosis ([place]) 03/09/2015     Palliative Care Assessment & Plan    Code Status:   Full code--chanaged today at daughter's request, we discussed that this is not recommended in   Goals of Care:   Family is open to all offered and available medical interventions to prolong life  Request update from Dr Comer-Dr Dyann Kief will  notify   3. Symptom Management:   Pain: Fentanyl 12.5 mcg  Every 4 hrs prn    Care plan was discussed with Dr Dyann Kief and Dr Linus Salmons     PMT will continue to support holistically  Thank  you for allowing the Palliative Medicine Team to assist in the care of this patient.   Time In: 1430 Time Out: 1505 Total Time 35 min Prolonged Time Billed  no     Greater than 50%  of this time was spent counseling and coordinating care related to the above assessment and plan.     Knox Royalty, NP  05/04/2015, 4:43 PM  Please contact Palliative Medicine Team phone at [phone] for questions and concerns.

## 2015-05-04 NOTE — Progress Notes (Signed)
[facility]  Patient Status: Active (receiving services up to time of hospitalization)  [facility] is providing the following services: RN  If patient discharges after hours, please call [phone].   Kristen Hayworth 05/04/2015, 11:13 AM

## 2015-05-04 NOTE — Progress Notes (Signed)
Initial Nutrition Assessment  DOCUMENTATION CODES:   Severe malnutrition in context of acute illness/injury  INTERVENTION:  - RD will continue to monitor for needs  NUTRITION DIAGNOSIS:   Inadequate oral intake related to lethargy/confusion as evidenced by meal completion < 25%.  GOAL:   Patient will meet greater than or equal to 90% of their needs  MONITOR:   PO intake, Weight trends, Labs, Skin, I & O's  REASON FOR ASSESSMENT:   Malnutrition Screening Tool  ASSESSMENT:   81 y.o. female who was recently admitted last month for obstructive jaundice with CT abdomen showing abdominal mass and patient had biliary drain placed was brought to the ER after patient was having poor appetite and weakness. As per patient's daughter patient has been doing very poorly over the last 1 week and over the last 2 days patient is unable to even walk. Has eaten anything over the last 2 days. Denies any nausea vomiting diarrhea fever chills or abdominal pain. Labs revealed acute renal failure with hyperkalemia with worsening bilirubin levels. Patient had recently been admitted and had extensive workup done with biopsies done were unrevealing. Patient was eventually referred to [facility] but was unable to keep the appointment due to weakness.   Pt seen for MST. BMI indicates normal weight status. Pt sleeping at time of RD visit. Did not perform physical assessment in consideration of pt comfort. Pt has not been responsive enough to consume PO since admission.  Per family, pt's appetite has been decreased for 1-2 weeks and has gotten progressive worse and had not eaten anything for at least 2 days PTA. Due to this, pt has been losing some weight in the past few weeks. Chart review shows 8 lbs weight loss (7% body weight) in the past 1 month which is significant for time frame.  Pt unable to meet needs. Will continue to monitor for [place] per family wishes. Medications reviewed. Labs reviewed; K: 6.7 mmol/L, Cl:  118 mmol/L, BUN/creatinine elevated, Ca: 6.9 mg/dL, GFR: 18.   Diet Order:  Diet Heart Room service appropriate?: Yes; Fluid consistency:: Thin  Skin:  Wound (see comment) (R abdominal incision from 04/09/15)  Last BM:  PTA  Height:   Ht Readings from Last 1 Encounters:  05/27/2015 4\' 6"  (1.372 m)    Weight:   Wt Readings from Last 1 Encounters:  05/04/15 105 lb 6.4 oz (47.809 kg)    Ideal Body Weight:     BMI:  Body mass index is 25.4 kg/(m^2).  Estimated Nutritional Needs:   Kcal:  1200-1400  Protein:  47-60 grams  Fluid:  2 L/day  EDUCATION NEEDS:   No education needs identified at this time     Jarome Matin, RD, LDN Inpatient Clinical Dietitian Pager # [phone] After hours/weekend pager # [phone]

## 2015-05-04 NOTE — Progress Notes (Signed)
Patient ID: Diana Pearson, female   DOB: 1928-10-28, 81 y.o.   MRN: [id number]    Referring Physician(s): [facility]  Chief Complaint:  Biliary obstruction, liver mass  Subjective: Patient lethargic and weak, jaundiced, minimal response from patient upon questioning by daughter, opens eyes but only moans   Allergies: Review of patient's allergies indicates no known allergies.  Medications: Prior to Admission medications   Medication Sig Start Date End Date Taking? Authorizing Provider  levothyroxine (SYNTHROID, LEVOTHROID) 88 MCG tablet Take 1 tablet (88 mcg total) by mouth daily before breakfast. 04/15/15  Yes Albertine Patricia, MD  metoprolol tartrate (LOPRESSOR) 25 MG tablet Take 0.5 tablets (12.5 mg total) by mouth 2 (two) times daily. 04/15/15  Yes Silver Huguenin Elgergawy, MD  ondansetron (ZOFRAN) 4 MG tablet Take 1 tablet (4 mg total) by mouth every 6 (six) hours as needed for nausea. 04/15/15  Yes Albertine Patricia, MD  OVER THE COUNTER MEDICATION Take 1 capsule by mouth 4 (four) times daily. Biofilm Detox   Yes Historical Provider, MD  OVER THE COUNTER MEDICATION Take 1 capsule by mouth 4 (four) times daily. Homeopathic supplement   Yes Historical Provider, MD  OVER THE COUNTER MEDICATION Take 1 capsule by mouth 2 (two) times daily. ATP Max   Yes Historical Provider, MD  OVER THE COUNTER MEDICATION Take 10 drops by mouth daily. Chelidonium   Yes Historical Provider, MD  oxyCODONE (ROXICODONE) 5 MG immediate release tablet Take 1 tablet (5 mg total) by mouth every 6 (six) hours as needed for severe pain. 04/15/15  Yes Albertine Patricia, MD  TURMERIC PO Take 1 capsule by mouth daily.   Yes Historical Provider, MD  feeding supplement, ENSURE ENLIVE, (ENSURE ENLIVE) LIQD Take 237 mLs by mouth 2 (two) times daily between meals. Patient not taking: Reported on 04/22/2015 04/15/15   Albertine Patricia, MD     Vital Signs: BP 118/53 mmHg  Pulse 71  Temp(Src) 97.6 F (36.4 C) (Oral)  Resp 16  Ht  4\' 6"  (1.372 m)  Wt 105 lb 6.4 oz (47.809 kg)  BMI 25.40 kg/m2  SpO2 100%  Physical Exam lethargic, weak; biliary drain intact, insertion site clean and dry, output 250 mL of dark bloody bile; drain irrigated with sterile normal saline without difficulty; abdomen soft, some wincing in pain with deep palpation  Imaging: Ct Abdomen Pelvis Wo Contrast  05/04/2015   CLINICAL DATA:  Hepatobiliary catheter drainage has become darker, with some oozing at the insertion site. Increasing weakness.  EXAM: CT ABDOMEN AND PELVIS WITHOUT CONTRAST  TECHNIQUE: Multidetector CT imaging of the abdomen and pelvis was performed following the standard protocol without IV contrast.  COMPARISON:  04/07/2015 CT. Procedural images from the transhepatic biliary interventions.  FINDINGS: The internal/external biliary drain appears satisfactorily positioned, entering the left hepatic lobe anteriorly, continuing into the central biliary system and then on down into the duodenum. The left hepatic ducts are decompressed. The right hepatic ducts are moderately distended. Extrahepatic bile ducts are decompressed. No significant fluid collection is evident along the course of the drainage catheter.  There is substantially reduced volume of ascites, with only a small perihepatic collection.  There are unremarkable unenhanced appearances of the pancreas, spleen, adrenals and kidneys. There is no bowel obstruction. There is no extraluminal air. No focal inflammatory changes are evident in the abdomen or pelvis. Uterus and adnexal structures appear unremarkable. The abdominal aorta is normal in caliber with moderate atherosclerotic calcification. No significant abnormality is evident in the  lower chest. No significant skeletal lesion is evident. There are unchanged chronic appearing compressions of L2, L1 and T10.  IMPRESSION: 1. Intact appearances of the biliary drain, with satisfactory position. 2. No abnormal collection along the course of  the drain. Moderate distention of the right hepatic biliary system but the left hepatic system and extrahepatic ducts are decompressed. 3. Substantially reduced ascites volume. 4. No acute findings are evident in the abdomen or pelvis.   Electronically Signed   By: Andreas Newport M.D.   On: 05/23/2015 22:22   Ir Exchange Biliary Drain  05/04/2015   INDICATION: History of central hepatic tumor, potentially a cholangiocarcinoma or hepatocellular carcinoma with resultant biliary obstruction.  The patient underwent a right-sided percutaneous external biliary drainage catheter placement on 04/09/2015 and subsequently a left sided internal/external biliary drainage catheter on 04/13/2015. Patient returned on 04/24/2013 for percutaneous cholangiogram with subsequent removal of the right-sided percutaneous biliary drainage catheter.  Patient now re-admitted to the hospital with persistent hyper bilirubinemia and dehydration.  Please perform percutaneous biliary drainage of cholangiogram and potential biliary drainage up sizing as clinically indicated.  EXAM: 1. CHOLANGIOGRAM VIA EXISTING PERCUTANEOUS BILIARY DRAINAGE CATHETER 2. FLUOROSCOPIC GUIDED LEFT-SIDED PERCUTANEOUS BILIARY DRAINAGE CATHETER  COMPARISON:  CT abdomen pelvis - 04/07/2015; 05/22/2015; fluoroscopic guided right-sided percutaneous biliary drainage catheter placement - 04/09/2015; ultrasound and fluoroscopic guided left-sided percutaneous biliary drainage catheter placement - 04/13/2015; cholangiogram via existing percutaneous biliary drainage catheter with subsequent right-sided biliary drainage catheter removal -04/25/2015  CONTRAST:  20 cc Omnipaque 300, administered into the biliary tree  FLUOROSCOPY TIME:  1 minute 54 seconds (16 mGy)  COMPLICATIONS: None immediate  TECHNIQUE: Informed written consent was obtained from the patient's family after a discussion of the risks, benefits and alternatives to treatment. Questions regarding the procedure  were encouraged and answered. A timeout was performed prior to the initiation of the procedure.  The external portion of the existing left-sided percutaneous biliary drainage catheter as well as the surrounding skin were prepped and draped in the usual sterile fashion. A sterile drape was applied covering the operative field. Maximum barrier sterile technique with sterile gowns and gloves were used for the procedure. A timeout was performed prior to the initiation of the procedure.  A pre procedural spot fluoroscopic image was obtained of the right upper abdominal quadrant and the existing residual left-sided percutaneous biliary drainage catheter. Multiple spot fluoroscopic and radiographic images were obtained following the injection of a small amount of contrast via the existing percutaneous biliary drainage catheter. The existing biliary drainage catheter was cut and cannulated with a Amplatz wire which was coiled within the proximal small bowel. Under intermittent fluoroscopic guidance, the existing PBD was exchanged for a new slightly larger now 12 [place] PBD. Small amount of contrast was injected via the exchanged biliary drainage catheter and a post exchange spot fluoroscopic image was obtained. The catheter was locked and secured to the skin with a single interrupted suture. The biliary drainage catheter was capped. A dressing was placed. The patient tolerated the procedure well without immediate postprocedural complication.  FINDINGS: The existing percutaneous biliary catheter is appropriately positioned and functioning, however there is no definitive opacification of the right biliary tree correlating with the apparent residual / recurrent intrahepatic biliary duct dilatation involving the right lobe of the liver on noncontrast abdominal CT obtained 05/01/2015.  After successful fluoroscopic guided exchange and up sizing, the new 12 [place] percutaneous biliary catheter is appropriately positioned  with end coiled and locked within the  proximal duodenum.  IMPRESSION: Successful fluoroscopic guided exchange and up sizing of a now 46 French percutaneous biliary drainage catheter.  PLAN: Would recommend obtaining serial bilirubin levels. If patient's bilirubin levels remain elevated, the patient may require placement of a new right-sided percutaneous biliary drainage catheter as it appears the left and right biliary trees no longer communicate.   Electronically Signed   By: Sandi Mariscal M.D.   On: 05/04/2015 11:03    Labs:  CBC:  Recent Labs  04/14/15 0519 04/25/15 0953 05/19/2015 2005 05/03/15 0350  WBC 9.0 12.4* 14.6* 14.0*  HGB 13.9 14.8 16.0* 14.7  HCT 40.8 41.1 44.4 41.8  PLT 208 174 232 173    COAGS:  Recent Labs  04/06/15 1405  04/07/15 0523 04/08/15 0720 04/08/15 1316 04/12/15 0546 04/25/15 0953  INR 1.44  < > 1.47 1.59*  --  1.46 1.65*  APTT 30  --   --   --  34 33  --   < > = values in this interval not displayed.  BMP:  Recent Labs  05/17/2015 2005 05/05/2015 2111 05/03/15 0350 05/03/15 1930 05/04/15 0906  NA 132*  --  137 136 141  K 6.4* 6.7* 5.7* 5.7* 6.7*  CL 103  --  108 113* 118*  CO2 9*  --  10* 12* 9*  GLUCOSE 144*  --  141* 106* 114*  BUN 132*  --  124* 124* 126*  CALCIUM 7.8*  --  7.6* 7.0* 6.9*  CREATININE 3.85*  --  3.65* 3.20* 2.33*  GFRNONAA 10*  --  11* 12* 18*  GFRAA 12*  --  12* 14* 21*    LIVER FUNCTION TESTS:  Recent Labs  04/15/15 0530 04/25/15 0953 05/04/2015 2005 05/03/15 0350  BILITOT 14.1* 23.9* 26.6* 22.0*  AST 72* 83* 107* 132*  ALT 35 35 48 53  ALKPHOS 143* 161* 210* 170*  PROT 5.9* 6.7 6.9 5.9*  ALBUMIN 1.8* 2.1* 2.5* 2.1*    Assessment and Plan: Pt with history of hepatitis C, cirrhosis, central liver mass with biliary obstruction, status post exchange and upsizing of pre-existing left biliary drain catheter to 12 [place] on 05/04/15; afebrile ; BP ok;follow-up bilirubin level pending; will submit bile for cultures  and cytology;if total bilirubin levels continue to rise despite recent drain up sizing may consider placement of right biliary drain if family/[facility]/palliative care strongly desire and patient stable enough to undergo conscious sedation.   Signed: D. Rowe Robert 05/04/2015, 2:20 PM   I spent a total of 15 minutes at the the patient's bedside AND on the patient's hospital floor or unit, greater than 50% of which was counseling/coordinating care for biliary drain

## 2015-05-04 NOTE — Consult Note (Signed)
Diana Pearson for Infectious Disease      Reason for Consult: bilirubinemia    Referring Physician: Dr. Dyann Kief  Principal Problem:   Acute renal failure [facility]) Active Problems:   Chronic hepatitis C without hepatic coma (HCC)   Hypothyroidism   Hyperkalemia   Chronic atrial fibrillation ([place])   Acute renal failure (ARF) (HCC)   General weakness   Palliative care encounter   Weakness generalized   Abdominal pain   Obstructive jaundice   Protein-calorie malnutrition, severe   Acidosis   . enoxaparin (LOVENOX) injection  30 mg Subcutaneous Q24H  . levothyroxine  44 mcg Intravenous Daily  . metoprolol  2.5 mg Intravenous Q12H    Recommendations: Continue care per GI, IR  I would ask to consult GI   Assessment: She has bilirubinemia   I am unclear of etiology of all of this.  I reviewed the previous record, labs, other consults.     I talked to the daughter, Diana Pearson, by phone, about the patient.    60 minutes spent in review, discussion with the daughter.    Antibiotics: none  HPI: Diana Pearson is a 81 y.o. female with hepatitis C who I saw for treatment and started her on Zepatier then developed jaundice and initially thought to have a liver mass causing obstruction but still unclear diagnosis.  She has had biopsies that are unrevealing and was set to go to [facility] but came in with poor po and obtunded with severely high bilirubin.  Still no etiology but has renal insufficiency, hyperkalemia.     Review of Systems: Review of systems not obtained due to patient factors.  Past Medical History  Diagnosis Date  . Hypothyroidism   . Osteoporosis   . Atrial fibrillation (HCC)     no anticoagulation.   . Jaundice 04/06/2015    Sudden onset, 10 days following initiation of Zepatier for Hep C.   . Abdominal distention 04/06/2015  . Aortic stenosis 10/2014    by echo.   . Hypercholesterolemia   . History of blood transfusion ~ 2006    "related to bleeding stomach ulcer"    . Bleeding stomach ulcer ~ 2006  . Hepatitis C ~ 2006    needle stick in 1970s  . Cancer [facility])     Social History  Substance Use Topics  . Smoking status: Former Smoker -- 0.12 packs/day for 10 years    Types: Cigarettes  . Smokeless tobacco: Never Used     Comment: "quit smoking in the 1990's"  . Alcohol Use: No    Family History  Problem Relation Age of Onset  . Stroke Mother    No Known Allergies  OBJECTIVE: Blood pressure 119/51, pulse 73, temperature 97.5 F (36.4 C), temperature source Oral, resp. rate 24, height 4\' 6"  (1.372 m), weight 105 lb 6.4 oz (47.809 kg), SpO2 97 %. General: non-responsive HEENT: icteric Skin: jaundice    Microbiology: Recent Results (from the past 240 hour(s))  MRSA PCR Screening     Status: None   Collection Time: 05/25/2015 11:18 PM  Result Value Ref Range Status   MRSA by PCR NEGATIVE NEGATIVE Final    Comment:        The GeneXpert MRSA Assay (FDA approved for NASAL specimens only), is one component of a comprehensive MRSA colonization surveillance program. It is not intended to diagnose MRSA infection nor to guide or monitor treatment for MRSA infections.     Scharlene Gloss, [place] for Infectious  Disease [facility] [email or web address] [phone] pager  [phone] cell 05/04/2015, 5:05 PM

## 2015-05-04 NOTE — Progress Notes (Signed)
CRITICAL VALUE ALERT  Critical value received:  K-6.7  Date of notification: 05/04/15  Time of notification: 1000  Critical value read back:yes  Nurse who received alert: Corlis Hove  MD notified (1st page): Dr. Dyann Kief  Time of first page: 1000  MD notified (2nd page):  Time of second page:  Responding MD: Dr. Dyann Kief  Time MD responded: 1002

## 2015-05-04 NOTE — Progress Notes (Signed)
Report received from F. [place], Therapist, sports. I agree with previous RN's assessment and will continue to follow plan of care. Carnella [place] I.

## 2015-05-05 ENCOUNTER — Ambulatory Visit (HOSPITAL_COMMUNITY): Payer: Self-pay

## 2015-05-05 DIAGNOSIS — B182 Chronic viral hepatitis C: Secondary | ICD-10-CM

## 2015-05-05 DIAGNOSIS — K7200 Acute and subacute hepatic failure without coma: Secondary | ICD-10-CM

## 2015-05-05 DIAGNOSIS — K831 Obstruction of bile duct: Secondary | ICD-10-CM

## 2015-05-05 LAB — CBC WITH DIFFERENTIAL/PLATELET
BASOS PCT: 0 %
Basophils Absolute: 0 10*3/uL (ref 0.0–0.1)
EOS PCT: 0 %
Eosinophils Absolute: 0 10*3/uL (ref 0.0–0.7)
HCT: 35.7 % — ABNORMAL LOW (ref 36.0–46.0)
Hemoglobin: 13.2 g/dL (ref 12.0–15.0)
LYMPHS ABS: 0.4 10*3/uL — AB (ref 0.7–4.0)
Lymphocytes Relative: 4 %
MCH: 31.8 pg (ref 26.0–34.0)
MCHC: 37 g/dL — ABNORMAL HIGH (ref 30.0–36.0)
MCV: 86 fL (ref 78.0–100.0)
MONO ABS: 0.5 10*3/uL (ref 0.1–1.0)
Monocytes Relative: 5 %
Neutro Abs: 9.7 10*3/uL — ABNORMAL HIGH (ref 1.7–7.7)
Neutrophils Relative %: 91 %
PLATELETS: 127 10*3/uL — AB (ref 150–400)
RBC: 4.15 MIL/uL (ref 3.87–5.11)
RDW: 21.2 % — AB (ref 11.5–15.5)
WBC: 10.6 10*3/uL — ABNORMAL HIGH (ref 4.0–10.5)

## 2015-05-05 LAB — GRAM STAIN: GRAM STAIN: NONE SEEN

## 2015-05-05 LAB — COMPREHENSIVE METABOLIC PANEL
ALK PHOS: 171 U/L — AB (ref 38–126)
ALT: 48 U/L (ref 14–54)
AST: 127 U/L — ABNORMAL HIGH (ref 15–41)
Albumin: 1.9 g/dL — ABNORMAL LOW (ref 3.5–5.0)
Anion gap: 15 (ref 5–15)
BUN: 114 mg/dL — ABNORMAL HIGH (ref 6–20)
CALCIUM: 6.8 mg/dL — AB (ref 8.9–10.3)
CO2: 18 mmol/L — AB (ref 22–32)
CREATININE: 2.47 mg/dL — AB (ref 0.44–1.00)
Chloride: 111 mmol/L (ref 101–111)
GFR calc non Af Amer: 17 mL/min — ABNORMAL LOW (ref >60)
GFR, EST AFRICAN AMERICAN: 20 mL/min — AB (ref >60)
GLUCOSE: 108 mg/dL — AB (ref 65–99)
Potassium: 4.4 mmol/L (ref 3.5–5.1)
SODIUM: 144 mmol/L (ref 135–145)
Total Bilirubin: 19.2 mg/dL (ref 0.3–1.2)
Total Protein: 5.2 g/dL — ABNORMAL LOW (ref 6.5–8.1)

## 2015-05-05 LAB — GLUCOSE, CAPILLARY
Glucose-Capillary: 101 mg/dL — ABNORMAL HIGH (ref 65–99)
Glucose-Capillary: 132 mg/dL — ABNORMAL HIGH (ref 65–99)
Glucose-Capillary: 94 mg/dL (ref 65–99)

## 2015-05-05 LAB — PROTIME-INR
INR: 3.93 — AB (ref 0.00–1.49)
Prothrombin Time: 37.5 seconds — ABNORMAL HIGH (ref 11.6–15.2)

## 2015-05-05 MED ORDER — DEXTROSE 5 % IV SOLN
1.0000 g | INTRAVENOUS | Status: DC
  Administered 2015-05-05 – 2015-05-06 (×2): 1 g via INTRAVENOUS
  Filled 2015-05-05 (×2): qty 1

## 2015-05-05 MED ORDER — HEPARIN SODIUM (PORCINE) 5000 UNIT/ML IJ SOLN
5000.0000 [IU] | Freq: Three times a day (TID) | INTRAMUSCULAR | Status: DC
  Administered 2015-05-06: 5000 [IU] via SUBCUTANEOUS
  Filled 2015-05-05 (×4): qty 1

## 2015-05-05 MED ORDER — VANCOMYCIN HCL 500 MG IV SOLR
500.0000 mg | INTRAVENOUS | Status: DC
  Administered 2015-05-05: 500 mg via INTRAVENOUS
  Filled 2015-05-05: qty 500

## 2015-05-05 MED ORDER — POLYVINYL ALCOHOL 1.4 % OP SOLN
1.0000 [drp] | OPHTHALMIC | Status: DC | PRN
  Administered 2015-05-05: 1 [drp] via OPHTHALMIC
  Filled 2015-05-05: qty 15

## 2015-05-05 NOTE — Progress Notes (Signed)
Referring Physician(s): TRH  Chief Complaint: Biliary obstruction s/p biliary drain placed  Subjective: Daughter present today-states patient appears slightly stronger and in less pain compared to previous day. Patient denies any abdominal pain.  Allergies: Review of patient's allergies indicates no known allergies.  Medications: Prior to Admission medications   Medication Sig Start Date End Date Taking? Authorizing Provider  levothyroxine (SYNTHROID, LEVOTHROID) 88 MCG tablet Take 1 tablet (88 mcg total) by mouth daily before breakfast. 04/15/15  Yes Albertine Patricia, MD  metoprolol tartrate (LOPRESSOR) 25 MG tablet Take 0.5 tablets (12.5 mg total) by mouth 2 (two) times daily. 04/15/15  Yes Silver Huguenin Elgergawy, MD  ondansetron (ZOFRAN) 4 MG tablet Take 1 tablet (4 mg total) by mouth every 6 (six) hours as needed for nausea. 04/15/15  Yes Albertine Patricia, MD  OVER THE COUNTER MEDICATION Take 1 capsule by mouth 4 (four) times daily. Biofilm Detox   Yes Historical Provider, MD  OVER THE COUNTER MEDICATION Take 1 capsule by mouth 4 (four) times daily. Homeopathic supplement   Yes Historical Provider, MD  OVER THE COUNTER MEDICATION Take 1 capsule by mouth 2 (two) times daily. ATP Max   Yes Historical Provider, MD  OVER THE COUNTER MEDICATION Take 10 drops by mouth daily. Chelidonium   Yes Historical Provider, MD  oxyCODONE (ROXICODONE) 5 MG immediate release tablet Take 1 tablet (5 mg total) by mouth every 6 (six) hours as needed for severe pain. 04/15/15  Yes Albertine Patricia, MD  TURMERIC PO Take 1 capsule by mouth daily.   Yes Historical Provider, MD  feeding supplement, ENSURE ENLIVE, (ENSURE ENLIVE) LIQD Take 237 mLs by mouth 2 (two) times daily between meals. Patient not taking: Reported on 04/22/2015 04/15/15   Albertine Patricia, MD    Vital Signs: BP 105/54 mmHg  Pulse 78  Temp(Src) 98.1 F (36.7 C) (Axillary)  Resp 18  Ht 4\' 6"  (1.372 m)  Wt 110 lb 9.6 oz (50.168 kg)   BMI 26.65 kg/m2  SpO2 100%  Physical Exam General: Lethargic, NAD Abd: Soft, slightly distended, NT, Biliary drain intact 100 cc dark bilious output in bag, (105 cc/24 hrs)  Imaging: Ct Abdomen Pelvis Wo Contrast  05/11/2015   CLINICAL DATA:  Hepatobiliary catheter drainage has become darker, with some oozing at the insertion site. Increasing weakness.  EXAM: CT ABDOMEN AND PELVIS WITHOUT CONTRAST  TECHNIQUE: Multidetector CT imaging of the abdomen and pelvis was performed following the standard protocol without IV contrast.  COMPARISON:  04/07/2015 CT. Procedural images from the transhepatic biliary interventions.  FINDINGS: The internal/external biliary drain appears satisfactorily positioned, entering the left hepatic lobe anteriorly, continuing into the central biliary system and then on down into the duodenum. The left hepatic ducts are decompressed. The right hepatic ducts are moderately distended. Extrahepatic bile ducts are decompressed. No significant fluid collection is evident along the course of the drainage catheter.  There is substantially reduced volume of ascites, with only a small perihepatic collection.  There are unremarkable unenhanced appearances of the pancreas, spleen, adrenals and kidneys. There is no bowel obstruction. There is no extraluminal air. No focal inflammatory changes are evident in the abdomen or pelvis. Uterus and adnexal structures appear unremarkable. The abdominal aorta is normal in caliber with moderate atherosclerotic calcification. No significant abnormality is evident in the lower chest. No significant skeletal lesion is evident. There are unchanged chronic appearing compressions of L2, L1 and T10.  IMPRESSION: 1. Intact appearances of the biliary drain,  with satisfactory position. 2. No abnormal collection along the course of the drain. Moderate distention of the right hepatic biliary system but the left hepatic system and extrahepatic ducts are decompressed.  3. Substantially reduced ascites volume. 4. No acute findings are evident in the abdomen or pelvis.   Electronically Signed   By: Andreas Newport M.D.   On: 05/16/2015 22:22   Ir Exchange Biliary Drain  05/04/2015   INDICATION: History of central hepatic tumor, potentially a cholangiocarcinoma or hepatocellular carcinoma with resultant biliary obstruction.  The patient underwent a right-sided percutaneous external biliary drainage catheter placement on 04/09/2015 and subsequently a left sided internal/external biliary drainage catheter on 04/13/2015. Patient returned on 04/24/2013 for percutaneous cholangiogram with subsequent removal of the right-sided percutaneous biliary drainage catheter.  Patient now re-admitted to the hospital with persistent hyper bilirubinemia and dehydration.  Please perform percutaneous biliary drainage of cholangiogram and potential biliary drainage up sizing as clinically indicated.  EXAM: 1. CHOLANGIOGRAM VIA EXISTING PERCUTANEOUS BILIARY DRAINAGE CATHETER 2. FLUOROSCOPIC GUIDED LEFT-SIDED PERCUTANEOUS BILIARY DRAINAGE CATHETER  COMPARISON:  CT abdomen pelvis - 04/07/2015; 05/22/2015; fluoroscopic guided right-sided percutaneous biliary drainage catheter placement - 04/09/2015; ultrasound and fluoroscopic guided left-sided percutaneous biliary drainage catheter placement - 04/13/2015; cholangiogram via existing percutaneous biliary drainage catheter with subsequent right-sided biliary drainage catheter removal -04/25/2015  CONTRAST:  20 cc Omnipaque 300, administered into the biliary tree  FLUOROSCOPY TIME:  1 minute 54 seconds (16 mGy)  COMPLICATIONS: None immediate  TECHNIQUE: Informed written consent was obtained from the patient's family after a discussion of the risks, benefits and alternatives to treatment. Questions regarding the procedure were encouraged and answered. A timeout was performed prior to the initiation of the procedure.  The external portion of the existing  left-sided percutaneous biliary drainage catheter as well as the surrounding skin were prepped and draped in the usual sterile fashion. A sterile drape was applied covering the operative field. Maximum barrier sterile technique with sterile gowns and gloves were used for the procedure. A timeout was performed prior to the initiation of the procedure.  A pre procedural spot fluoroscopic image was obtained of the right upper abdominal quadrant and the existing residual left-sided percutaneous biliary drainage catheter. Multiple spot fluoroscopic and radiographic images were obtained following the injection of a small amount of contrast via the existing percutaneous biliary drainage catheter. The existing biliary drainage catheter was cut and cannulated with a Amplatz wire which was coiled within the proximal small bowel. Under intermittent fluoroscopic guidance, the existing PBD was exchanged for a new slightly larger now 12 [place] PBD. Small amount of contrast was injected via the exchanged biliary drainage catheter and a post exchange spot fluoroscopic image was obtained. The catheter was locked and secured to the skin with a single interrupted suture. The biliary drainage catheter was capped. A dressing was placed. The patient tolerated the procedure well without immediate postprocedural complication.  FINDINGS: The existing percutaneous biliary catheter is appropriately positioned and functioning, however there is no definitive opacification of the right biliary tree correlating with the apparent residual / recurrent intrahepatic biliary duct dilatation involving the right lobe of the liver on noncontrast abdominal CT obtained 05/10/2015.  After successful fluoroscopic guided exchange and up sizing, the new 12 French percutaneous biliary catheter is appropriately positioned with end coiled and locked within the proximal duodenum.  IMPRESSION: Successful fluoroscopic guided exchange and up sizing of a now 64  French percutaneous biliary drainage catheter.  PLAN: Would recommend obtaining serial bilirubin levels.  If patient's bilirubin levels remain elevated, the patient may require placement of a new right-sided percutaneous biliary drainage catheter as it appears the left and right biliary trees no longer communicate.   Electronically Signed   By: Sandi Mariscal M.D.   On: 05/04/2015 11:03    Labs:  CBC:  Recent Labs  04/25/15 0953 05/13/2015 2005 05/03/15 0350 05/05/15 0628  WBC 12.4* 14.6* 14.0* 10.6*  HGB 14.8 16.0* 14.7 13.2  HCT 41.1 44.4 41.8 35.7*  PLT 174 232 173 127*    COAGS:  Recent Labs  04/06/15 1405  04/08/15 0720 04/08/15 1316 04/12/15 0546 04/25/15 0953 05/05/15 0427  INR 1.44  < > 1.59*  --  1.46 1.65* 3.93*  APTT 30  --   --  34 33  --   --   < > = values in this interval not displayed.  BMP:  Recent Labs  05/03/15 1930 05/04/15 0906 05/04/15 1450 05/05/15 0427  NA 136 141 141 144  K 5.7* 6.7* 5.0 4.4  CL 113* 118* 120* 111  CO2 12* 9* 10* 18*  GLUCOSE 106* 114* 119* 108*  BUN 124* 126* 124* 114*  CALCIUM 7.0* 6.9* 7.3* 6.8*  CREATININE 3.20* 2.33* 2.46* 2.47*  GFRNONAA 12* 18* 17* 17*  GFRAA 14* 21* 20* 20*    LIVER FUNCTION TESTS:  Recent Labs  05/08/2015 2005 05/03/15 0350 05/04/15 1450 05/05/15 0427  BILITOT 26.6* 22.0* 22.9* 19.2*  AST 107* 132* 151* 127*  ALT 48 53 57* 48  ALKPHOS 210* 170* 176* 171*  PROT 6.9 5.9* 5.5* 5.2*  ALBUMIN 2.5* 2.1* 1.9* 1.9*    Assessment and Plan: Hepatitis C Cirrhosis Central liver mass  Biliary obstruction secondary to tumor, brush biopsy x 2 - non diagnostic, elevated AFP  S/p right external biliary drain placed 04/09/15 S/p Left internal/external biliary drain placed 04/14/15 S/p cholangiogram with exchange of left sided internal/external drain and removal of right external biliary drain 04/25/15  Admitted 10/3 with weakness, dehydration, acute renal failure S/p biliary drain exchange and  upsize to 12 F on 10/4 T.bili trending down 19.2 (22.9), LFT's slowly trending down, afebrile, wbc trending down Will follow, if total bilirubin levels continue to rise despite recent drain up sizing may consider placement of right biliary drain if family/TRH/palliative care strongly desire and patient stable    Signed: Tsosie Billing D 05/05/2015, 3:09 PM   I spent a total of 15 Minutes at the the patient's bedside AND on the patient's hospital floor or unit, greater than 50% of which was counseling/coordinating care for biliary obstruction.

## 2015-05-05 NOTE — Progress Notes (Signed)
[facility] for Infectious Disease   Date of Admission:  05/28/2015  Antibiotics: Vancomycin/ceftaz  Subjective: No acute events  Objective: Temp:  [97.5 F (36.4 C)-97.6 F (36.4 C)] 97.5 F (36.4 C) (10/06 0410) Pulse Rate:  [73-79] 76 (10/06 0410) Resp:  [16-24] 18 (10/06 0410) BP: (111-119)/(45-51) 111/45 mmHg (10/06 0410) SpO2:  [97 %-100 %] 98 % (10/06 0410) Weight:  [110 lb 9.6 oz (50.168 kg)] 110 lb 9.6 oz (50.168 kg) (10/06 0410)  General: awake, does not respond Skin: jaundic Lungs: CTA B Cor: RRR Abdomen: soft, nt, nd Ext: no edema  Review of systems not obtained due to patient factors.  Lab Results Lab Results  Component Value Date   WBC 10.6* 05/05/2015   HGB 13.2 05/05/2015   HCT 35.7* 05/05/2015   MCV 86.0 05/05/2015   PLT 127* 05/05/2015    Lab Results  Component Value Date   CREATININE 2.47* 05/05/2015   BUN 114* 05/05/2015   NA 144 05/05/2015   K 4.4 05/05/2015   CL 111 05/05/2015   CO2 18* 05/05/2015    Lab Results  Component Value Date   ALT 48 05/05/2015   AST 127* 05/05/2015   ALKPHOS 171* 05/05/2015   BILITOT 19.2* 05/05/2015      Microbiology: Recent Results (from the past 240 hour(s))  MRSA PCR Screening     Status: None   Collection Time: 05/22/2015 11:18 PM  Result Value Ref Range Status   MRSA by PCR NEGATIVE NEGATIVE Final    Comment:        The GeneXpert MRSA Assay (FDA approved for NASAL specimens only), is one component of a comprehensive MRSA colonization surveillance program. It is not intended to diagnose MRSA infection nor to guide or monitor treatment for MRSA infections.   Culture, body fluid-bottle     Status: None (Preliminary result)   Collection Time: 05/04/15  9:00 PM  Result Value Ref Range Status   Specimen Description FLUID  Final   Special Requests BILE  Final   Gram Stain   Final    GRAM POSITIVE COCCI IN CLUSTERS IN BOTH AEROBIC AND ANAEROBIC BOTTLES CRITICAL RESULT CALLED TO, READ  BACK BY AND VERIFIED WITH: VALERIE STEWART @0555  05/05/15 MKELLY    Culture   Final    NO GROWTH < 12 HOURS Performed at [facility]    Report Status PENDING  Incomplete  Gram stain     Status: None   Collection Time: 05/04/15  9:00 PM  Result Value Ref Range Status   Specimen Description FLUID  Final   Special Requests BILE  Final   Gram Stain   Final    NO WBC SEEN FEW GRAM POSITIVE COCCI IN CLUSTERS RARE YEAST Performed at [facility]    Report Status 05/05/2015 FINAL  Final    Studies/Results: Ir Exchange Biliary Drain  05/04/2015   INDICATION: History of central hepatic tumor, potentially a cholangiocarcinoma or hepatocellular carcinoma with resultant biliary obstruction.  The patient underwent a right-sided percutaneous external biliary drainage catheter placement on 04/09/2015 and subsequently a left sided internal/external biliary drainage catheter on 04/13/2015. Patient returned on 04/24/2013 for percutaneous cholangiogram with subsequent removal of the right-sided percutaneous biliary drainage catheter.  Patient now re-admitted to the hospital with persistent hyper bilirubinemia and dehydration.  Please perform percutaneous biliary drainage of cholangiogram and potential biliary drainage up sizing as clinically indicated.  EXAM: 1. CHOLANGIOGRAM VIA EXISTING PERCUTANEOUS BILIARY DRAINAGE CATHETER 2. FLUOROSCOPIC GUIDED LEFT-SIDED PERCUTANEOUS  BILIARY DRAINAGE CATHETER  COMPARISON:  CT abdomen pelvis - 04/07/2015; 05/15/2015; fluoroscopic guided right-sided percutaneous biliary drainage catheter placement - 04/09/2015; ultrasound and fluoroscopic guided left-sided percutaneous biliary drainage catheter placement - 04/13/2015; cholangiogram via existing percutaneous biliary drainage catheter with subsequent right-sided biliary drainage catheter removal -04/25/2015  CONTRAST:  20 cc Omnipaque 300, administered into the biliary tree  FLUOROSCOPY TIME:  1 minute 54 seconds  (16 mGy)  COMPLICATIONS: None immediate  TECHNIQUE: Informed written consent was obtained from the patient's family after a discussion of the risks, benefits and alternatives to treatment. Questions regarding the procedure were encouraged and answered. A timeout was performed prior to the initiation of the procedure.  The external portion of the existing left-sided percutaneous biliary drainage catheter as well as the surrounding skin were prepped and draped in the usual sterile fashion. A sterile drape was applied covering the operative field. Maximum barrier sterile technique with sterile gowns and gloves were used for the procedure. A timeout was performed prior to the initiation of the procedure.  A pre procedural spot fluoroscopic image was obtained of the right upper abdominal quadrant and the existing residual left-sided percutaneous biliary drainage catheter. Multiple spot fluoroscopic and radiographic images were obtained following the injection of a small amount of contrast via the existing percutaneous biliary drainage catheter. The existing biliary drainage catheter was cut and cannulated with a Amplatz wire which was coiled within the proximal small bowel. Under intermittent fluoroscopic guidance, the existing PBD was exchanged for a new slightly larger now 12 [place] PBD. Small amount of contrast was injected via the exchanged biliary drainage catheter and a post exchange spot fluoroscopic image was obtained. The catheter was locked and secured to the skin with a single interrupted suture. The biliary drainage catheter was capped. A dressing was placed. The patient tolerated the procedure well without immediate postprocedural complication.  FINDINGS: The existing percutaneous biliary catheter is appropriately positioned and functioning, however there is no definitive opacification of the right biliary tree correlating with the apparent residual / recurrent intrahepatic biliary duct dilatation  involving the right lobe of the liver on noncontrast abdominal CT obtained 05/16/2015.  After successful fluoroscopic guided exchange and up sizing, the new 12 French percutaneous biliary catheter is appropriately positioned with end coiled and locked within the proximal duodenum.  IMPRESSION: Successful fluoroscopic guided exchange and up sizing of a now 47 French percutaneous biliary drainage catheter.  PLAN: Would recommend obtaining serial bilirubin levels. If patient's bilirubin levels remain elevated, the patient may require placement of a new right-sided percutaneous biliary drainage catheter as it appears the left and right biliary trees no longer communicate.   Electronically Signed   By: Sandi Mariscal M.D.   On: 05/04/2015 11:03    Assessment/Plan:  1) liver failure/biliary obstruction - unclear etiology.  ? If cancer or not.   GI to see today.  ? If hepat[facility] would be of [facility] (at [facility]  2) GPC - doubt infection but will continue antibiotics for now.  3) dispo - after talking to the patients daughter yesterday, I arranged to meet her around 1pm but she is not here.  Will try again tomorrow.   Scharlene Gloss, [place] for Infectious Disease [place] [email or web address] [phone] pager   [phone] cell 05/05/2015, 1:27 PM

## 2015-05-05 NOTE — Progress Notes (Signed)
ANTIBIOTIC CONSULT NOTE - INITIAL  Pharmacy Consult for Fortaz/Vancomycin Indication: Gm + Cocci in biliary fluid  No Known Allergies  Patient Measurements: Height: 4\' 6"  (137.2 cm) Weight: 110 lb 9.6 oz (50.168 kg) IBW/kg (Calculated) : 31.7   Vital Signs: Temp: 97.5 F (36.4 C) (10/06 0410) Temp Source: Oral (10/06 0410) BP: 111/45 mmHg (10/06 0410) Pulse Rate: 76 (10/06 0410) Intake/Output from previous day: 10/05 0701 - 10/06 0700 In: 1907.1 [P.O.:120; I.V.:1787.1] Out: 105 [Drains:105] Intake/Output from this shift: Total I/O In: 1220 [P.O.:120; I.V.:1100] Out: 25 [Drains:25]  Labs:  Recent Labs  05/24/2015 2005 05/03/15 0350  05/04/15 0906 05/04/15 1450 05/05/15 0427  WBC 14.6* 14.0*  --   --   --   --   HGB 16.0* 14.7  --   --   --   --   PLT 232 173  --   --   --   --   CREATININE 3.85* 3.65*  < > 2.33* 2.46* 2.47*  < > = values in this interval not displayed. Estimated Creatinine Clearance: 10.7 mL/min (by C-G formula based on Cr of 2.47). No results for input(s): VANCOTROUGH, VANCOPEAK, VANCORANDOM, GENTTROUGH, GENTPEAK, GENTRANDOM, TOBRATROUGH, TOBRAPEAK, TOBRARND, AMIKACINPEAK, AMIKACINTROU, AMIKACIN in the last 72 hours.   Microbiology: Recent Results (from the past 720 hour(s))  MRSA PCR Screening     Status: None   Collection Time: 05/20/2015 11:18 PM  Result Value Ref Range Status   MRSA by PCR NEGATIVE NEGATIVE Final    Comment:        The GeneXpert MRSA Assay (FDA approved for NASAL specimens only), is one component of a comprehensive MRSA colonization surveillance program. It is not intended to diagnose MRSA infection nor to guide or monitor treatment for MRSA infections.   Culture, body fluid-bottle     Status: None (Preliminary result)   Collection Time: 05/04/15  9:00 PM  Result Value Ref Range Status   Specimen Description FLUID  Final   Special Requests BILE  Final   Gram Stain   Final    GRAM POSITIVE COCCI IN CLUSTERS IN BOTH  AEROBIC AND ANAEROBIC BOTTLES CRITICAL RESULT CALLED TO, READ BACK BY AND VERIFIED WITH: VALERIE STEWART @0555  05/05/15 MKELLY Performed at [facility]    Culture PENDING  Incomplete   Report Status PENDING  Incomplete  Gram stain     Status: None   Collection Time: 05/04/15  9:00 PM  Result Value Ref Range Status   Specimen Description FLUID  Final   Special Requests BILE  Final   Gram Stain   Final    NO WBC SEEN FEW GRAM POSITIVE COCCI IN CLUSTERS RARE YEAST Performed at [facility]    Report Status 05/05/2015 FINAL  Final    Medical History: Past Medical History  Diagnosis Date  . Hypothyroidism   . Osteoporosis   . Atrial fibrillation (HCC)     no anticoagulation.   . Jaundice 04/06/2015    Sudden onset, 10 days following initiation of Zepatier for Hep C.   . Abdominal distention 04/06/2015  . Aortic stenosis 10/2014    by echo.   . Hypercholesterolemia   . History of blood transfusion ~ 2006    "related to bleeding stomach ulcer"  . Bleeding stomach ulcer ~ 2006  . Hepatitis C ~ 2006    needle stick in 1970s  . [facility])     Medications:  Prescriptions prior to admission  Medication Sig Dispense Refill Last Dose  .  levothyroxine (SYNTHROID, LEVOTHROID) 88 MCG tablet Take 1 tablet (88 mcg total) by mouth daily before breakfast. 30 tablet 3 Past Week at Unknown time  . metoprolol tartrate (LOPRESSOR) 25 MG tablet Take 0.5 tablets (12.5 mg total) by mouth 2 (two) times daily. 60 tablet 0 Past Week at Unknown time  . ondansetron (ZOFRAN) 4 MG tablet Take 1 tablet (4 mg total) by mouth every 6 (six) hours as needed for nausea. 20 tablet 1 Past Week at Unknown time  . OVER THE COUNTER MEDICATION Take 1 capsule by mouth 4 (four) times daily. Biofilm Detox   Past Week at Unknown time  . OVER THE COUNTER MEDICATION Take 1 capsule by mouth 4 (four) times daily. Homeopathic supplement   Past Week at Unknown time  . OVER THE COUNTER MEDICATION Take 1 capsule  by mouth 2 (two) times daily. ATP Max   Past Week at Unknown time  . OVER THE COUNTER MEDICATION Take 10 drops by mouth daily. Chelidonium   Past Week at Unknown time  . oxyCODONE (ROXICODONE) 5 MG immediate release tablet Take 1 tablet (5 mg total) by mouth every 6 (six) hours as needed for severe pain. 30 tablet 0 05/01/2015 at Unknown time  . TURMERIC PO Take 1 capsule by mouth daily.   05/01/2015 at Unknown time  . feeding supplement, ENSURE ENLIVE, (ENSURE ENLIVE) LIQD Take 237 mLs by mouth 2 (two) times daily between meals. (Patient not taking: Reported on 04/22/2015) 237 mL 12 Not Taking at Unknown time   Scheduled:  . cefTAZidime (FORTAZ)  IV  1 g Intravenous Q24H  . enoxaparin (LOVENOX) injection  30 mg Subcutaneous Q24H  . levothyroxine  44 mcg Intravenous Daily  . metoprolol  2.5 mg Intravenous Q12H  . vancomycin  500 mg Intravenous Q48H   Infusions:  .  sodium bicarbonate 150 mEq in sterile water 1000 mL infusion 100 mL/hr at 05/05/15 0004   Assessment: 23 yoF admitted 10/3 with recent dx of liver Ca now with Gm+ cocci in biliary fluid.  Fortaz/Vancomycin per Rx.  Goal of Therapy:  Vancomycin trough level 15-20 mcg/ml  Plan:   Tressie Ellis 1Gm IV q24h  Vancomycin 500mg  IV q48h  F/u Scr/cultures/levels as needed  Lawana Pai R 05/05/2015,6:43 AM

## 2015-05-05 NOTE — Consult Note (Signed)
Referring Provider: [facility] Primary Care Physician:  [facility] Primary Gastroenterologist:  Dr. Fuller Plan  Reason for Consultation:   Jaundice, abnormal LFTs      HPI: Diana Pearson is a 81 y.o. female who is known to Dr. Fuller Plan. She was evaluated in the office in May 2006 for elevated transaminases. The patient is [place] and speaks no [place]. She was accompanied by her daughter to that visit who translated. Records revealed that transaminases have been mildly elevated in the range of twice normal dating back to blood work in [employer] in October 2015. ANA negative. No other hepatic serologies were performed. The daughter reported that the patient had been diagnosed with hepatitis C 10 years ago in [place] when the patient was noted to have elevated LFTs at that time. The daughter reported that the patient's LFTs had been elevated over the past 10 years. Abdominal ultrasound performed in August 2015 in [facility] showed tiny left renal cyst, several scattered non-shadowing echogenic foci in the liver and questionable nodular contour of the liver--possible cirrhosis. Patient was subsequently referred for treatment for her hepatitis C. She also has a history of dyslipidemia, atrial fibrillation, hypothyroidism, and osteoporosis.  The patient was subsequently admitted on September 7 for painless jaundice. At that admission she was noted to have a bilirubin of 23.5. The daughter states this happened shortly after starting therapy for her hepatitis C with zepatier. She had a CT of the abdomen and pelvis that showed a mass within the central segment of the right lobe of the liver which demonstrated progressive heterogenous enhancement. There was marked diffuse intrahepatic ductal dilatation. There was enhancing tumor thrombus at the main portal vein bifurcation extending into the proximal left and right portal vein branches. No additional liver masses were noted. Mild  porta hepatis and gastrohepatic ligament lymphadenopathy. The concern was for hepato-cellular carcinoma versus cholangiocarcinoma. She was evaluated by interventional radiology and gastroenterology and subsequently had a percutaneous transhepatic cholangiogram with external biliary drain placement. Endoluminal biopsy on 04/09/2015 was nondiagnostic on cytology. Patient had improvement of her bilirubin continued to be jaundiced. Surgery did not feel she was a surgical candidate. AFP was 46. CA 19-9 was 734. Acute hepatitis panel was negative except HCV antibody. CT of the chest 04/14/2015 showed no evidence of metastasis to the thorax. Patient was evaluated by oncology and do to the fact there was no evidence of metastasis on CT chest or abdomen it was felt she would need surgical evaluation to consider resection. The patient was referred to the hepatobiliary oncology surgery service at [facility]. It was felt the patient was likely a poor surgical candidate due to her liver cirrhosis, hepatitis C and debilitated state.  The patient had an appointment to be seen at [facility] on October 3 but unfortunately that day she felt severely weak and had abdominal pain. Her family noted that her drainage in her biliary drain changed to dark brown from yellow. Patient had no appetite. She had no diarrhea and was making very little urine. Labs revealed acute renal failure, hyperkalemia, and increasing bilirubin levels. On October 5 she was seen by IR and had an exchange and upsizing of her pre-existing left biliary drain catheter to 35 [place]. Fluid was submitted for cultures and cytology. It was felt total bili levels continued to rise placement of a right biliary drain could be considered. Patient has been evaluated by palliative care. Patient's daughter has asked for aggressive medical intervention. GI consultation requested  to see if there are any additional recommendations for this patient.  Past Medical History  Diagnosis Date    . Hypothyroidism   . Osteoporosis   . Atrial fibrillation (HCC)     no anticoagulation.   . Jaundice 04/06/2015    Sudden onset, 10 days following initiation of Zepatier for Hep C.   . Abdominal distention 04/06/2015  . Aortic stenosis 10/2014    by echo.   . Hypercholesterolemia   . History of blood transfusion ~ 2006    "related to bleeding stomach ulcer"  . Bleeding stomach ulcer ~ 2006  . Hepatitis C ~ 2006    needle stick in 1970s  . Cancer [facility])     Past Surgical History  Procedure Laterality Date  . Hip fracture surgery Right 2012  . Fracture surgery    . Cataract extraction w/ intraocular lens  implant, bilateral Bilateral 1990's    Prior to Admission medications   Medication Sig Start Date End Date Taking? Authorizing Provider  levothyroxine (SYNTHROID, LEVOTHROID) 88 MCG tablet Take 1 tablet (88 mcg total) by mouth daily before breakfast. 04/15/15  Yes Albertine Patricia, MD  metoprolol tartrate (LOPRESSOR) 25 MG tablet Take 0.5 tablets (12.5 mg total) by mouth 2 (two) times daily. 04/15/15  Yes Silver Huguenin Elgergawy, MD  ondansetron (ZOFRAN) 4 MG tablet Take 1 tablet (4 mg total) by mouth every 6 (six) hours as needed for nausea. 04/15/15  Yes Albertine Patricia, MD  OVER THE COUNTER MEDICATION Take 1 capsule by mouth 4 (four) times daily. Biofilm Detox   Yes Historical Provider, MD  OVER THE COUNTER MEDICATION Take 1 capsule by mouth 4 (four) times daily. Homeopathic supplement   Yes Historical Provider, MD  OVER THE COUNTER MEDICATION Take 1 capsule by mouth 2 (two) times daily. ATP Max   Yes Historical Provider, MD  OVER THE COUNTER MEDICATION Take 10 drops by mouth daily. Chelidonium   Yes Historical Provider, MD  oxyCODONE (ROXICODONE) 5 MG immediate release tablet Take 1 tablet (5 mg total) by mouth every 6 (six) hours as needed for severe pain. 04/15/15  Yes Albertine Patricia, MD  TURMERIC PO Take 1 capsule by mouth daily.   Yes Historical Provider, MD  feeding  supplement, ENSURE ENLIVE, (ENSURE ENLIVE) LIQD Take 237 mLs by mouth 2 (two) times daily between meals. Patient not taking: Reported on 04/22/2015 04/15/15   Albertine Patricia, MD    Current Facility-Administered Medications  Medication Dose Route Frequency Provider Last Rate Last Dose  . acetaminophen (TYLENOL) tablet 650 mg  650 mg Oral Q6H PRN Rise Patience, MD       Or  . acetaminophen (TYLENOL) suppository 650 mg  650 mg Rectal Q6H PRN Rise Patience, MD      . cefTAZidime (FORTAZ) 1 g in dextrose 5 % 50 mL IVPB  1 g Intravenous Q24H Dorrene German, RPH   1 g at 05/05/15 0708  . fentaNYL (SUBLIMAZE) injection 12.5 mcg  12.5 mcg Intravenous Q4H PRN Knox Royalty, NP   12.5 mcg at 05/05/15 0552  . [START ON 05/06/2015] heparin injection 5,000 Units  5,000 Units Subcutaneous 3 times per day Barton Dubois, MD      . levothyroxine (SYNTHROID, LEVOTHROID) injection 44 mcg  44 mcg Intravenous Daily Rise Patience, MD   44 mcg at 05/05/15 0930  . metoprolol (LOPRESSOR) injection 2.5 mg  2.5 mg Intravenous Q12H Gardiner Barefoot, NP   2.5 mg at  05/05/15 0930  . ondansetron (ZOFRAN) tablet 4 mg  4 mg Oral Q6H PRN Rise Patience, MD       Or  . ondansetron [facility]) injection 4 mg  4 mg Intravenous Q6H PRN Rise Patience, MD      . sodium bicarbonate 150 mEq in sterile water 1,000 mL infusion   Intravenous Continuous Barton Dubois, MD 100 mL/hr at 05/05/15 1331    . vancomycin (VANCOCIN) 500 mg in sodium chloride 0.9 % 100 mL IVPB  500 mg Intravenous Q48H Dorrene German, RPH   500 mg at 05/05/15 3614    Allergies as of 05/21/2015  . (No Known Allergies)    Family History  Problem Relation Age of Onset  . Stroke Mother     Social History   Social History  . Marital Status: Widowed    Spouse Name: N/A  . Number of Children: N/A  . Years of Education: N/A   Occupational History  . Not on file.   Social History Main Topics  . Smoking status: Former  Smoker -- 0.12 packs/day for 10 years    Types: Cigarettes  . Smokeless tobacco: Never Used     Comment: "quit smoking in the 1990's"  . Alcohol Use: No  . Drug Use: No  . Sexual Activity: No   Other Topics Concern  . Not on file   Social History Narrative    Review of Systems: Patient speaks no [place]. Review of systems per history of present illness above which was acquired largely from the patient's daughter and review of chart.  Physical Exam: Vital signs in last 24 hours: Temp:  [97.5 F (36.4 C)-97.6 F (36.4 C)] 97.5 F (36.4 C) (10/06 0410) Pulse Rate:  [73-79] 76 (10/06 0410) Resp:  [16-24] 18 (10/06 0410) BP: (111-119)/(45-51) 111/45 mmHg (10/06 0410) SpO2:  [97 %-100 %] 98 % (10/06 0410) Weight:  [110 lb 9.6 oz (50.168 kg)] 110 lb 9.6 oz (50.168 kg) (10/06 0410)   General:   Alert, cachectic jaundiced female who is moaning and discomfort. Head:  Normocephalic and atraumatic. Eyes:  Sclera anicteric   Conjunctiva pink. Ears:  Normal auditory acuity. Nose:  No deformity, discharge,  or lesions. Mouth:  No deformity or lesions.   Neck:  Supple; no masses or thyromegaly. Lungs:  Clear throughout to auscultation.   No wheezes, crackles, or rhonchi.  Heart:  Regular rate and rhythm;  Abdomen:  Soft,nontender, BS active, biliary drain in place with dark drainage Rectal:  Deferred  Msk:  Symmetrical without gross deformities. . Pulses:  Normal pulses noted. Extremities: Without clubbing or edema.   Intake/Output from previous day: 10/05 0701 - 10/06 0700 In: 1907.1 [P.O.:120; I.V.:1787.1] Out: 105 [Drains:105] Intake/Output this shift: Total I/O In: 60 [P.O.:10; IV Piggyback:50] Out: -   Lab Results:  Recent Labs  05/12/2015 2005 05/03/15 0350 05/05/15 0628  WBC 14.6* 14.0* 10.6*  HGB 16.0* 14.7 13.2  HCT 44.4 41.8 35.7*  PLT 232 173 127*   BMET  Recent Labs  05/04/15 0906 05/04/15 1450 05/05/15 0427  NA 141 141 144  K 6.7* 5.0 4.4  CL 118*  120* 111  CO2 9* 10* 18*  GLUCOSE 114* 119* 108*  BUN 126* 124* 114*  CREATININE 2.33* 2.46* 2.47*  CALCIUM 6.9* 7.3* 6.8*   LFT  Recent Labs  05/05/15 0427  PROT 5.2*  ALBUMIN 1.9*  AST 127*  ALT 48  ALKPHOS 171*  BILITOT 19.2*   PT/INR  Recent  Labs  05/05/15 0427  LABPROT 37.5*  INR 3.93*    Studies/Results: Ir Exchange Biliary Drain  05/04/2015   INDICATION: History of central hepatic tumor, potentially a cholangiocarcinoma or hepatocellular carcinoma with resultant biliary obstruction.  The patient underwent a right-sided percutaneous external biliary drainage catheter placement on 04/09/2015 and subsequently a left sided internal/external biliary drainage catheter on 04/13/2015. Patient returned on 04/24/2013 for percutaneous cholangiogram with subsequent removal of the right-sided percutaneous biliary drainage catheter.  Patient now re-admitted to the hospital with persistent hyper bilirubinemia and dehydration.  Please perform percutaneous biliary drainage of cholangiogram and potential biliary drainage up sizing as clinically indicated.  EXAM: 1. CHOLANGIOGRAM VIA EXISTING PERCUTANEOUS BILIARY DRAINAGE CATHETER 2. FLUOROSCOPIC GUIDED LEFT-SIDED PERCUTANEOUS BILIARY DRAINAGE CATHETER  COMPARISON:  CT abdomen pelvis - 04/07/2015; 05/20/2015; fluoroscopic guided right-sided percutaneous biliary drainage catheter placement - 04/09/2015; ultrasound and fluoroscopic guided left-sided percutaneous biliary drainage catheter placement - 04/13/2015; cholangiogram via existing percutaneous biliary drainage catheter with subsequent right-sided biliary drainage catheter removal -04/25/2015  CONTRAST:  20 cc Omnipaque 300, administered into the biliary tree  FLUOROSCOPY TIME:  1 minute 54 seconds (16 mGy)  COMPLICATIONS: None immediate  TECHNIQUE: Informed written consent was obtained from the patient's family after a discussion of the risks, benefits and alternatives to treatment. Questions  regarding the procedure were encouraged and answered. A timeout was performed prior to the initiation of the procedure.  The external portion of the existing left-sided percutaneous biliary drainage catheter as well as the surrounding skin were prepped and draped in the usual sterile fashion. A sterile drape was applied covering the operative field. Maximum barrier sterile technique with sterile gowns and gloves were used for the procedure. A timeout was performed prior to the initiation of the procedure.  A pre procedural spot fluoroscopic image was obtained of the right upper abdominal quadrant and the existing residual left-sided percutaneous biliary drainage catheter. Multiple spot fluoroscopic and radiographic images were obtained following the injection of a small amount of contrast via the existing percutaneous biliary drainage catheter. The existing biliary drainage catheter was cut and cannulated with a Amplatz wire which was coiled within the proximal small bowel. Under intermittent fluoroscopic guidance, the existing PBD was exchanged for a new slightly larger now 12 [place] PBD. Small amount of contrast was injected via the exchanged biliary drainage catheter and a post exchange spot fluoroscopic image was obtained. The catheter was locked and secured to the skin with a single interrupted suture. The biliary drainage catheter was capped. A dressing was placed. The patient tolerated the procedure well without immediate postprocedural complication.  FINDINGS: The existing percutaneous biliary catheter is appropriately positioned and functioning, however there is no definitive opacification of the right biliary tree correlating with the apparent residual / recurrent intrahepatic biliary duct dilatation involving the right lobe of the liver on noncontrast abdominal CT obtained 05/20/2015.  After successful fluoroscopic guided exchange and up sizing, the new 12 [place] percutaneous biliary catheter is  appropriately positioned with end coiled and locked within the proximal duodenum.  IMPRESSION: Successful fluoroscopic guided exchange and up sizing of a now 56 French percutaneous biliary drainage catheter.  PLAN: Would recommend obtaining serial bilirubin levels. If patient's bilirubin levels remain elevated, the patient may require placement of a new right-sided percutaneous biliary drainage catheter as it appears the left and right biliary trees no longer communicate.   Electronically Signed   By: Sandi Mariscal M.D.   On: 05/04/2015 11:03    IMPRESSION/PLAN: 81 year old  female admitted with obstructive jaundice with liver mass status post placement of biliary drain. IR has changed  drain, and bilirubin is starting to trend down slowly. Will review with attending as to further recommendations.  Acute renal failure Aortic stenosis Atrial fibrillation Hypothyroidism   Hvozdovic, Vita Barley PA-C 05/05/2015,  Pager [phone]  GI Attending Note  I have personally taken an interval history, reviewed the chart, and examined the patient. Consultative note to follow.   Sandy Salaam. Deatra Ina, MD, [facility] [phone]

## 2015-05-05 NOTE — Progress Notes (Signed)
Per progression Meeting 05/05/2015: Palliative Consult with [facility] 05/04/2015. Pt is Full Code at daughters request. PER [facility] Cyril Mourning [place], RN [phone]. ) pt was receiving HHRN up to time of admission. Will continue to follow for final disposition and discharge needs as pt is obtunded and requiring total care.

## 2015-05-05 NOTE — Progress Notes (Addendum)
Patient ID: Diana Pearson, female   DOB: 1929-04-07, 81 y.o.   MRN: [id number]  [facility] PROGRESS NOTE  Diana Pearson JKD:[id number] DOB: 01-28-1929 DOA: 05/01/2015 PCP: [facility]  Assessment/Plan: Generalized weakness/active declining and severe protein calorie malnutrition  - Likely in the setting of multiple issues including but not limited acute renal failure, dehydration and hyperkalemia - At this time, family prefers to talk to palliative care; will follow up on recommendations for [place] -she has remained to be full code -she has remained unable to eat or drink -Obtain PT evaluation when pt able to participate.  Hyperkalemia -Current potassium 4.4 -will continue bicarb drip -will follow electrolytes   Acute renal failure: with concerns for hepatorenal syndrome -secondary to dehydration and pre-renal azotemia -Continue IV bicarbonate drip  -metabolic acidosis improving -Follow up renal function trend -Cr improving  -GFR categorized her in stage 4-5 currently  Severe protein calorie malnutrition -patient unable to eat or drink properly given degree of obtundation  -palliative is on board and will addressed GOC, invasive/artificial nutrition and decision of further care  Obstructive Jaundice/liver mass -due to ongoing cirrhosis with biliary obstruction and elevated bilirubin -will follow IR rec's -patient is frail, but patient's family optimistic about procedures that can be done to help patient in acute setting -status post exchange biliary drain catheter (upsizing to a 12 french catheter); bilirubin started to trend down some. Will monitor   -will ask GI for inputs and rec's -positive GPC growing on biliary cx, will continue current empiric antibiotic coverage  Hx of severe aortic stenosis  Will follow balance as she is on IV fluids No signs of fluid overload at this moment  Hx of atrial fibrillation Not a candidate for  anticoagulation CHADsVASC score 3  Hypothyroidism Will continue synthroid   DVT prophylaxis: heparin  Code Status: Full Code Family Communication: daughter at bedside Disposition Plan: remains inpatient, will follow electrolytes and response to her bilirubin level   Consultants:  ID  IR  Oncology service  Palliative Care  GI  Procedures:  up sizing percutaneous biliary catheter 05/04/15    Antibiotics:  None   HPI/Subjective: Afebrile,more awake, very frail and no complaining of nausea or vomiting. Bilirubin level 19.2, potassium 4.4  Objective: Filed Vitals:   05/05/15 0410  BP: 111/45  Pulse: 76  Temp: 97.5 F (36.4 C)  Resp: 18    Intake/Output Summary (Last 24 hours) at 05/05/15 1247 Last data filed at 05/05/15 1046  Gross per 24 hour  Intake 1967.08 ml  Output    105 ml  Net 1862.08 ml   Filed Weights   05/18/2015 2319 05/04/15 0531 05/05/15 0410  Weight: 46.1 kg (101 lb 10.1 oz) 47.809 kg (105 lb 6.4 oz) 50.168 kg (110 lb 9.6 oz)    Exam: General: Appears malnourished, remains jaundiced, weak and no talking much. She is more alert today  CVS: RRR, S1/S2 appreciated  Pulmonary: Effort and breath sounds normal, no stridor, rhonchi, wheezes, rales.  Abdominal: Soft. BS +,  no distension, tenderness, rebound or guarding; percutaneous drain in place  Musculoskeletal: No edema and no tenderness.  Neuro: No focal deficits  Data Reviewed: Basic Metabolic Panel:  Recent Labs Lab 05/03/15 0350 05/03/15 1930 05/04/15 0906 05/04/15 1450 05/05/15 0427  NA 137 136 141 141 144  K 5.7* 5.7* 6.7* 5.0 4.4  CL 108 113* 118* 120* 111  CO2 10* 12* 9* 10* 18*  GLUCOSE 141* 106* 114* 119* 108*  BUN 124* 124*  126* 124* 114*  CREATININE 3.65* 3.20* 2.33* 2.46* 2.47*  CALCIUM 7.6* 7.0* 6.9* 7.3* 6.8*   Liver Function Tests:  Recent Labs Lab 04/30/2015 2005 05/03/15 0350 05/04/15 1450 05/05/15 0427  AST 107* 132* 151* 127*  ALT 48 53 57* 48   ALKPHOS 210* 170* 176* 171*  BILITOT 26.6* 22.0* 22.9* 19.2*  PROT 6.9 5.9* 5.5* 5.2*  ALBUMIN 2.5* 2.1* 1.9* 1.9*    Recent Labs Lab 05/23/2015 2005  LIPASE 56*    Recent Labs Lab 05/04/15 1900  AMMONIA 94*   CBC:  Recent Labs Lab 05/01/2015 2005 05/03/15 0350 05/05/15 0628  WBC 14.6* 14.0* 10.6*  NEUTROABS  --  12.3* 9.7*  HGB 16.0* 14.7 13.2  HCT 44.4 41.8 35.7*  MCV 88.3 90.1 86.0  PLT 232 173 127*   ProBNP (last 3 results) No results for input(s): PROBNP in the last 8760 hours.  CBG:  Recent Labs Lab 05/04/15 1207 05/04/15 1702 05/05/15 0041 05/05/15 0649 05/05/15 1207  GLUCAP 198* 85 94 101* 132*    Recent Results (from the past 240 hour(s))  MRSA PCR Screening     Status: None   Collection Time: 05/13/2015 11:18 PM  Result Value Ref Range Status   MRSA by PCR NEGATIVE NEGATIVE Final    Comment:        The GeneXpert MRSA Assay (FDA approved for NASAL specimens only), is one component of a comprehensive MRSA colonization surveillance program. It is not intended to diagnose MRSA infection nor to guide or monitor treatment for MRSA infections.   Culture, body fluid-bottle     Status: None (Preliminary result)   Collection Time: 05/04/15  9:00 PM  Result Value Ref Range Status   Specimen Description FLUID  Final   Special Requests BILE  Final   Gram Stain   Final    GRAM POSITIVE COCCI IN CLUSTERS IN BOTH AEROBIC AND ANAEROBIC BOTTLES CRITICAL RESULT CALLED TO, READ BACK BY AND VERIFIED WITH: VALERIE STEWART @0555  05/05/15 MKELLY    Culture   Final    NO GROWTH < 12 HOURS Performed at [facility]    Report Status PENDING  Incomplete  Gram stain     Status: None   Collection Time: 05/04/15  9:00 PM  Result Value Ref Range Status   Specimen Description FLUID  Final   Special Requests BILE  Final   Gram Stain   Final    NO WBC SEEN FEW GRAM POSITIVE COCCI IN CLUSTERS RARE YEAST Performed at [facility]    Report Status  05/05/2015 FINAL  Final     Studies: Ir Exchange Biliary Drain  05/04/2015   INDICATION: History of central hepatic tumor, potentially a cholangiocarcinoma or hepatocellular carcinoma with resultant biliary obstruction.  The patient underwent a right-sided percutaneous external biliary drainage catheter placement on 04/09/2015 and subsequently a left sided internal/external biliary drainage catheter on 04/13/2015. Patient returned on 04/24/2013 for percutaneous cholangiogram with subsequent removal of the right-sided percutaneous biliary drainage catheter.  Patient now re-admitted to the hospital with persistent hyper bilirubinemia and dehydration.  Please perform percutaneous biliary drainage of cholangiogram and potential biliary drainage up sizing as clinically indicated.  EXAM: 1. CHOLANGIOGRAM VIA EXISTING PERCUTANEOUS BILIARY DRAINAGE CATHETER 2. FLUOROSCOPIC GUIDED LEFT-SIDED PERCUTANEOUS BILIARY DRAINAGE CATHETER  COMPARISON:  CT abdomen pelvis - 04/07/2015; 05/06/2015; fluoroscopic guided right-sided percutaneous biliary drainage catheter placement - 04/09/2015; ultrasound and fluoroscopic guided left-sided percutaneous biliary drainage catheter placement - 04/13/2015; cholangiogram via existing percutaneous biliary drainage  catheter with subsequent right-sided biliary drainage catheter removal -04/25/2015  CONTRAST:  20 cc Omnipaque 300, administered into the biliary tree  FLUOROSCOPY TIME:  1 minute 54 seconds (16 mGy)  COMPLICATIONS: None immediate  TECHNIQUE: Informed written consent was obtained from the patient's family after a discussion of the risks, benefits and alternatives to treatment. Questions regarding the procedure were encouraged and answered. A timeout was performed prior to the initiation of the procedure.  The external portion of the existing left-sided percutaneous biliary drainage catheter as well as the surrounding skin were prepped and draped in the usual sterile fashion. A  sterile drape was applied covering the operative field. Maximum barrier sterile technique with sterile gowns and gloves were used for the procedure. A timeout was performed prior to the initiation of the procedure.  A pre procedural spot fluoroscopic image was obtained of the right upper abdominal quadrant and the existing residual left-sided percutaneous biliary drainage catheter. Multiple spot fluoroscopic and radiographic images were obtained following the injection of a small amount of contrast via the existing percutaneous biliary drainage catheter. The existing biliary drainage catheter was cut and cannulated with a Amplatz wire which was coiled within the proximal small bowel. Under intermittent fluoroscopic guidance, the existing PBD was exchanged for a new slightly larger now 12 [place] PBD. Small amount of contrast was injected via the exchanged biliary drainage catheter and a post exchange spot fluoroscopic image was obtained. The catheter was locked and secured to the skin with a single interrupted suture. The biliary drainage catheter was capped. A dressing was placed. The patient tolerated the procedure well without immediate postprocedural complication.  FINDINGS: The existing percutaneous biliary catheter is appropriately positioned and functioning, however there is no definitive opacification of the right biliary tree correlating with the apparent residual / recurrent intrahepatic biliary duct dilatation involving the right lobe of the liver on noncontrast abdominal CT obtained 05/25/2015.  After successful fluoroscopic guided exchange and up sizing, the new 12 French percutaneous biliary catheter is appropriately positioned with end coiled and locked within the proximal duodenum.  IMPRESSION: Successful fluoroscopic guided exchange and up sizing of a now 46 French percutaneous biliary drainage catheter.  PLAN: Would recommend obtaining serial bilirubin levels. If patient's bilirubin levels  remain elevated, the patient may require placement of a new right-sided percutaneous biliary drainage catheter as it appears the left and right biliary trees no longer communicate.   Electronically Signed   By: Sandi Mariscal M.D.   On: 05/04/2015 11:03    Scheduled Meds: . cefTAZidime (FORTAZ)  IV  1 g Intravenous Q24H  . [START ON 05/06/2015] heparin subcutaneous  5,000 Units Subcutaneous 3 times per day  . levothyroxine  44 mcg Intravenous Daily  . metoprolol  2.5 mg Intravenous Q12H  . vancomycin  500 mg Intravenous Q48H   Continuous Infusions: .  sodium bicarbonate 150 mEq in sterile water 1000 mL infusion 100 mL/hr at 05/05/15 0004    Principal Problem:   Acute renal failure (HCC) Active Problems:   Chronic hepatitis C without hepatic coma (HCC)   Hypothyroidism   Hyperkalemia   Chronic atrial fibrillation (HCC)   Acute renal failure (ARF) ([place])   General weakness   Palliative care encounter   Weakness generalized   Abdominal pain   Obstructive jaundice   Protein-calorie malnutrition, severe   Acidosis    Time spent: 30 minutes   Barton Dubois  [facility] Pager [phone]. If 7PM-7AM, please contact night-coverage at [email or web address], password [facility]  05/05/2015, 12:47 PM  LOS: 3 days

## 2015-05-06 ENCOUNTER — Other Ambulatory Visit: Payer: No Typology Code available for payment source

## 2015-05-06 ENCOUNTER — Ambulatory Visit: Payer: No Typology Code available for payment source | Admitting: Hematology

## 2015-05-06 DIAGNOSIS — R7881 Bacteremia: Secondary | ICD-10-CM

## 2015-05-06 DIAGNOSIS — K7290 Hepatic failure, unspecified without coma: Secondary | ICD-10-CM

## 2015-05-06 LAB — COMPREHENSIVE METABOLIC PANEL
ALBUMIN: 1.8 g/dL — AB (ref 3.5–5.0)
ALT: 49 U/L (ref 14–54)
ANION GAP: 15 (ref 5–15)
AST: 136 U/L — ABNORMAL HIGH (ref 15–41)
Alkaline Phosphatase: 173 U/L — ABNORMAL HIGH (ref 38–126)
BUN: 109 mg/dL — ABNORMAL HIGH (ref 6–20)
CO2: 27 mmol/L (ref 22–32)
Calcium: 6.3 mg/dL — CL (ref 8.9–10.3)
Chloride: 103 mmol/L (ref 101–111)
Creatinine, Ser: 2.15 mg/dL — ABNORMAL HIGH (ref 0.44–1.00)
GFR calc non Af Amer: 20 mL/min — ABNORMAL LOW (ref >60)
GFR, EST AFRICAN AMERICAN: 23 mL/min — AB (ref >60)
GLUCOSE: 117 mg/dL — AB (ref 65–99)
POTASSIUM: 4.9 mmol/L (ref 3.5–5.1)
SODIUM: 145 mmol/L (ref 135–145)
Total Bilirubin: 19.4 mg/dL (ref 0.3–1.2)
Total Protein: 5.1 g/dL — ABNORMAL LOW (ref 6.5–8.1)

## 2015-05-06 LAB — GLUCOSE, CAPILLARY
GLUCOSE-CAPILLARY: 120 mg/dL — AB (ref 65–99)
Glucose-Capillary: 138 mg/dL — ABNORMAL HIGH (ref 65–99)
Glucose-Capillary: 163 mg/dL — ABNORMAL HIGH (ref 65–99)

## 2015-05-06 MED ORDER — SODIUM CHLORIDE 0.9 % IV SOLN
2.0000 g | Freq: Once | INTRAVENOUS | Status: AC
  Administered 2015-05-06: 2 g via INTRAVENOUS
  Filled 2015-05-06: qty 20

## 2015-05-06 MED ORDER — MAGNESIUM SULFATE IN D5W 10-5 MG/ML-% IV SOLN
1.0000 g | Freq: Once | INTRAVENOUS | Status: AC
  Administered 2015-05-06: 1 g via INTRAVENOUS
  Filled 2015-05-06: qty 100

## 2015-05-06 MED ORDER — LACTULOSE 10 GM/15ML PO SOLN
20.0000 g | Freq: Two times a day (BID) | ORAL | Status: DC
  Administered 2015-05-06 – 2015-05-07 (×3): 20 g via ORAL
  Filled 2015-05-06 (×3): qty 30

## 2015-05-06 NOTE — Progress Notes (Signed)
Daily Progress Note   Patient Name: Diana Pearson       Date: 05/06/2015 DOB: 1928/10/28  Age: 81 y.o. MRN#: [id number] Attending Physician: Barton Dubois, MD Primary Care Physician: [facility] Admit Date: 05/18/2015  Reason for Consultation/Follow-up: Establishing goals of care  Subjective:     -continued conversation regarding diagnosis, prognosis, goals of care, treatments and options  -active listening for daughter's fears and frustrations.  She clearly is asking for aggressive medical interventions to treat the treatable.   Emotional support offered, today she sees improvement in her mother's condition  -explored feelings regarding limitations of medicine and concept of mortality   Length of Stay: 4 days  Current Medications: Scheduled Meds:  . heparin subcutaneous  5,000 Units Subcutaneous 3 times per day  . lactulose  20 g Oral BID  . levothyroxine  44 mcg Intravenous Daily  . metoprolol  2.5 mg Intravenous Q12H    Continuous Infusions: .  sodium bicarbonate 150 mEq in sterile water 1000 mL infusion 75 mL/hr at 05/06/15 1225    PRN Meds: acetaminophen **OR** acetaminophen, fentaNYL (SUBLIMAZE) injection, ondansetron **OR** ondansetron (ZOFRAN) IV, polyvinyl alcohol  Palliative Performance Scale: 20 %     Vital Signs: BP 93/52 mmHg  Pulse 75  Temp(Src) 97.8 F (36.6 C) (Oral)  Resp 14  Ht 4\' 6"  (1.372 m)  Wt 48.988 kg (108 lb)  BMI 26.02 kg/m2  SpO2 98% SpO2: SpO2: 98 % O2 Device: O2 Device: Not Delivered O2 Flow Rate: O2 Flow Rate (L/min): 2 L/min  Intake/output summary:   Intake/Output Summary (Last 24 hours) at 05/06/15 1544 Last data filed at 05/06/15 1100  Gross per 24 hour  Intake    110 ml  Output     40 ml  Net     70 ml   LBM:   Baseline Weight: Weight: 46.1 kg (101 lb 10.1 oz) Most recent weight: Weight: 48.988 kg (108 lb)  Physical Exam:   General: Ill appearing, thin and frail appearing, more responsive  today  Eyes: Icterus present.  ENT: dry buccal membranes  Cardiovascular: RRR  Respiratory: No rhonchi or crepitations, decreased in bases  Abdomen: NT on gentle palpation  Skin: juandice  Neurologic: follows commands through daughters interpretation, lethargic     Additional Data Reviewed: Recent Labs     05/05/15  0427  05/05/15  0628  05/06/15  0800  WBC   --   10.6*   --   HGB   --   13.2   --   PLT   --   127*   --   NA  144   --   145  BUN  114*   --   109*  CREATININE  2.47*   --   2.15*     Problem List:  Patient Active Problem List   Diagnosis Date Noted  . Palliative care encounter 05/04/2015  . Weakness generalized 05/04/2015  . Protein-calorie malnutrition, severe 05/04/2015  . Abdominal pain   . Obstructive jaundice   . Acidosis   . Acute renal failure ([place]) 05/03/2015  . Chronic atrial fibrillation ([place]) 05/03/2015  . Acute renal failure (ARF) ([place]) 05/03/2015  . General weakness   . Hyperkalemia 05/06/2015  . Cholangiocarcinoma ([place]) 04/15/2015  . Portal vein thrombosis   . Liver mass   . Liver failure, acute   . Jaundice 04/06/2015  . Icterus 04/06/2015  . Abdominal distention 04/06/2015  . Acute liver failure 04/06/2015  .  Nausea without vomiting 04/06/2015  . Cirrhosis ([place])   . Ovarian cyst 03/10/2015  . Osteoporosis 03/10/2015  . Hypertension 03/10/2015  . Hypothyroidism 03/10/2015  . Chronic hepatitis C without hepatic coma ([place]) 03/09/2015  . Hepatic cirrhosis ([place]) 03/09/2015     Palliative Care Assessment & Plan    Code Status:   Full code-- discussed that this is not recommended knowing outcomes in similar patients   Goals of Care:   Family is open to all offered and available medical interventions to prolong life  Family remain hopeful for improvement    3. Symptom Management:   Pain: Fentanyl 12.5 mcg  Every 4 hrs prn    Care plan was discussed with Dr Dyann Kief      PMT will continue to support  holistically  Thank you for allowing the Palliative Medicine Team to assist in the care of this patient.   Time In: 1600 Time Out: 1625 Total Time 25 min Prolonged Time Billed  no     Greater than 50%  of this time was spent counseling and coordinating care related to the above assessment and plan.     Knox Royalty, NP  05/06/2015, 3:44 PM  Please contact Palliative Medicine Team phone at [phone] for questions and concerns.

## 2015-05-06 NOTE — Progress Notes (Signed)
Oncology Short Note  Patient was scheduled for follow-up in oncology clinic today that was noted to be admitted to the hospital. 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 . 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 She has not officially had a biopsy since she was still entertaining thoughts of being seen by hepatobiliary surgery at [facility] and was set up for an appointment on 05/05/2015 but was too sick to follow-up there. We typically avoid biopsies with cholangiocarcinomas if patient is surgical candidate to avoid seeding of the needle track. At this time though she does not seem to be a surgical candidate biopsying her lesion would be fraught with significant risk of uncontrollable bleeding.  Patient this time has signs of overt progressive liver failure in the setting of hepatitis C and liver cirrhosis in addition to her biliary obstruction. This is characterized by her significant coagulopathy possibly related to liver disease versus DIC and confusion due to hepatic encephalopathy in addition to multifactorial delirium. Consider vitamin K replacement.  I met the daughter in the hallway as I was going to see the patient. She notes that she does not believe that this is cancer and appears to be at least to some extent in denial of her mother's situation which is understandable. She was not keen for me to see her mom. I did give her my best judgment and recommended that she would certainly be a poor candidate for even palliative chemoradiation which would be the palliative option. I recommended she certainly not consider surgery given her mother's fragile health and significant medical comorbidities. I strongly recommended that she consider best  supportive care's in the interest of her mother's comfort. We'll hold off on officially consulting on this patient currently. Consider further goals of care discussion with palliative care.  Kindly let me know if any other questions or concerns arise.  Sullivan Lone MD [place] AAHIVMS [facility] Hematology/Oncology Physician [place]  (Office):       [phone] (Work cell):  [phone] (Fax):           [phone]

## 2015-05-06 NOTE — Progress Notes (Signed)
Patient's BP 90/50 HR-75 metoprolol due, Mary Lynch-NP notified and ordered to hold it tonight. Will continue to assess patient.

## 2015-05-06 NOTE — Progress Notes (Signed)
     [facility] Progress Note  Subjective:  T bili 19.4 this morning, Alk phos 173, ALT 49, ASR 136.  GFR 20.Bile fluid with gram positive cocci.On fortaz and vancomycin.   Objective:  Vital signs in last 24 hours: Temp:  [97.5 F (36.4 C)-98.1 F (36.7 C)] 97.6 F (36.4 C) (10/07 0700) Pulse Rate:  [78-84] 83 (10/07 0929) Resp:  [14-18] 14 (10/07 0700) BP: (91-113)/(46-61) 109/61 mmHg (10/07 0929) SpO2:  [98 %-100 %] 98 % (10/07 0929) Weight:  [108 lb (48.988 kg)] 108 lb (48.988 kg) (10/07 0700)   General:   Frail, jaundiced, a little more alert than yesterday Heart:  Regular rate and rhythm; no murmurs Pulm;lungs clear Abdomen:  Soft, nontender and nondistended. Normal bowel sounds,percutaneous drain in place with dark drainage Extremities:  Without edema. .  Intake/Output from previous day: 10/06 0701 - 10/07 0700 In: 110 [P.O.:10; IV Piggyback:100] Out: -  Intake/Output this shift: Total I/O In: -  Out: 40 [Drains:40]  Lab Results:  Recent Labs  05/05/15 0628  WBC 10.6*  HGB 13.2  HCT 35.7*  PLT 127*   BMET  Recent Labs  05/04/15 1450 05/05/15 0427 05/06/15 0800  NA 141 144 145  K 5.0 4.4 4.9  CL 120* 111 103  CO2 10* 18* 27  GLUCOSE 119* 108* 117*  BUN 124* 114* 109*  CREATININE 2.46* 2.47* 2.15*  CALCIUM 7.3* 6.8* 6.3*   LFT  Recent Labs  05/06/15 0800  PROT 5.1*  ALBUMIN 1.8*  AST 136*  ALT 49  ALKPHOS 173*  BILITOT 19.4*   PT/INR  Recent Labs  05/05/15 0427  LABPROT 37.5*  INR 3.93*     ASSESSMENT/PLAN:  81 year old female admitted with obstructive jaundice with liver mass status post placement of biliary drain. IrR has changed  Drain size, and bilirubin is starting to trend down slowly.  Biliary cultures with gram positive cocci--on vanc and fortaz. INR 3.93. Will review with attending as to further recommendations.     LOS: 4 days   Hvozdovic, Deloris Ping 05/06/2015, Pager [phone]  GI Attending  Note   Chart was reviewed and patient was examined. X-rays and lab were reviewed. Elevated bilirubin probably reflects absence of drainage to the right hepatic lobe.  In addition she may be developing liver failure.  Theraputic options are very limited.  If an aggressive approach is to be made, would consult IR to consider draining the right intrahepatic system.  Note she has a coagulopathy that may preclude another PTC.  May also consider palliative care consult.    Sandy Salaam. Deatra Ina, M.D., [facility] Gastroenterology Cell [phone]

## 2015-05-06 NOTE — Progress Notes (Addendum)
Patient ID: Diana Pearson, female   DOB: 16-Nov-1928, 81 y.o.   MRN: [id number]  [facility] PROGRESS NOTE  Netta Fodge ACZ:[id number] DOB: 05-May-1929 DOA: 05/14/2015 PCP: [facility]  Assessment/Plan: Generalized weakness/active declining and severe protein calorie malnutrition  - Likely in the setting of multiple issues including but not limited acute renal failure, dehydration and hyperkalemia - At this time, family prefers to talk to palliative care; will follow up on recommendations for [place] -she has remained to be full code -she has remained unable to eat, but drinking more according to family -Obtain PT evaluation when pt able to participate.  Confusion/encephalopathy: multifactorial, due to elevated bilirubin, dehydration, electrolytes abnormalities and elevated ammonia  -slightly better -ammonia is elevated -will use some lactulose to improve numbers as per patient's family request  Hyperkalemia -Current potassium 4.9 -will continue bicarb drip -will follow electrolytes   Acute renal failure: with concerns for hepatorenal syndrome -secondary to dehydration and pre-renal azotemia -Continue IV bicarbonate drip  -metabolic acidosis improving -Follow up renal function trend -Cr slowly improving  -GFR categorized her in stage 4-5 currently  Severe protein calorie malnutrition/hypoalbuminemia -patient unable to eat or drink properly given degree of obtundation  -palliative is on board and will addressed GOC, invasive/artificial nutrition and decision of further care  Obstructive Jaundice/liver mass -due to ongoing cirrhosis with biliary obstruction and elevated bilirubin -will follow IR rec's -patient is frail, but patient's family optimistic about procedures that can be done to help patient in acute setting -status post exchange biliary drain catheter (upsizing to a 12 french catheter); bilirubin started to trend down some. Will monitor   -will ask GI  for inputs and rec's -GPC growing in biliary cx; for now will continue antibiotics and will deferred to ID on final decision regarding need for further abx's therapy  Hx of severe aortic stenosis  Will follow balance as she is on IV fluids No signs of fluid overload at this moment  Hx of atrial fibrillation Not a candidate for anticoagulation CHADsVASC score 3  Hypothyroidism Will continue synthroid   Hypocalcemia -associated with renal failure most likely -once tolerated PO's will initiate calcitriol -for now will use intermittent IV repletion   DVT prophylaxis: heparin  Code Status: Full Code Family Communication: daughter at bedside Disposition Plan: remains inpatient, will follow electrolytes and response to her bilirubin level   Consultants:  ID  IR  Oncology service  Palliative Care  GI  Procedures:  Up sizing percutaneous biliary catheter 05/04/15    Antibiotics:  None   HPI/Subjective: Afebrile, more awake, very frail and no complaining of nausea/vomiting or abd pain. Bilirubin level 19.4, potassium 4.9. Overall deconditioned and with poor prognosis   Objective: Filed Vitals:   05/05/15 2121  BP: 91/47  Pulse: 84  Temp: 97.5 F (36.4 C)  Resp: 18    Intake/Output Summary (Last 24 hours) at 05/06/15 0716 Last data filed at 05/06/15 0550  Gross per 24 hour  Intake     60 ml  Output      0 ml  Net     60 ml   Filed Weights   05/05/2015 2319 05/04/15 0531 05/05/15 0410  Weight: 46.1 kg (101 lb 10.1 oz) 47.809 kg (105 lb 6.4 oz) 50.168 kg (110 lb 9.6 oz)    Exam: General: Appears malnourished, frail and chronically ill; remains jaundiced, weak and no eating much. She is more alert and per family drinking some water.  CVS: RRR, S1/S2 appreciated  Pulmonary: Effort and breath sounds normal, no stridor, rhonchi, wheezes, rales.  Abdominal: Soft. BS +,  no distension, tenderness, rebound or guarding; percutaneous drain in place  Musculoskeletal:  No edema and no tenderness.  Neuro: No focal deficits  Data Reviewed: Basic Metabolic Panel:  Recent Labs Lab 05/03/15 1930 05/04/15 0906 05/04/15 1450 05/05/15 0427 05/06/15 0800  NA 136 141 141 144 145  K 5.7* 6.7* 5.0 4.4 4.9  CL 113* 118* 120* 111 103  CO2 12* 9* 10* 18* 27  GLUCOSE 106* 114* 119* 108* 117*  BUN 124* 126* 124* 114* 109*  CREATININE 3.20* 2.33* 2.46* 2.47* 2.15*  CALCIUM 7.0* 6.9* 7.3* 6.8* 6.3*   Liver Function Tests:  Recent Labs Lab 05/17/2015 2005 05/03/15 0350 05/04/15 1450 05/05/15 0427 05/06/15 0800  AST 107* 132* 151* 127* 136*  ALT 48 53 57* 48 49  ALKPHOS 210* 170* 176* 171* 173*  BILITOT 26.6* 22.0* 22.9* 19.2* 19.4*  PROT 6.9 5.9* 5.5* 5.2* 5.1*  ALBUMIN 2.5* 2.1* 1.9* 1.9* 1.8*    Recent Labs Lab 05/05/2015 2005  LIPASE 56*    Recent Labs Lab 05/04/15 1900  AMMONIA 94*   CBC:  Recent Labs Lab 05/21/2015 2005 05/03/15 0350 05/05/15 0628  WBC 14.6* 14.0* 10.6*  NEUTROABS  --  12.3* 9.7*  HGB 16.0* 14.7 13.2  HCT 44.4 41.8 35.7*  MCV 88.3 90.1 86.0  PLT 232 173 127*   CBG:  Recent Labs Lab 05/04/15 1702 05/05/15 0041 05/05/15 0649 05/05/15 1207 05/06/15 0653  GLUCAP 85 94 101* 132* 120*    Recent Results (from the past 240 hour(s))  MRSA PCR Screening     Status: None   Collection Time: 05/14/2015 11:18 PM  Result Value Ref Range Status   MRSA by PCR NEGATIVE NEGATIVE Final    Comment:        The GeneXpert MRSA Assay (FDA approved for NASAL specimens only), is one component of a comprehensive MRSA colonization surveillance program. It is not intended to diagnose MRSA infection nor to guide or monitor treatment for MRSA infections.   Culture, body fluid-bottle     Status: None (Preliminary result)   Collection Time: 05/04/15  9:00 PM  Result Value Ref Range Status   Specimen Description FLUID  Final   Special Requests BILE  Final   Gram Stain   Final    GRAM POSITIVE COCCI IN CLUSTERS IN BOTH  AEROBIC AND ANAEROBIC BOTTLES CRITICAL RESULT CALLED TO, READ BACK BY AND VERIFIED WITH: VALERIE STEWART @0555  05/05/15 MKELLY    Culture   Final    NO GROWTH < 12 HOURS Performed at [facility]    Report Status PENDING  Incomplete  Gram stain     Status: None   Collection Time: 05/04/15  9:00 PM  Result Value Ref Range Status   Specimen Description FLUID  Final   Special Requests BILE  Final   Gram Stain   Final    NO WBC SEEN FEW GRAM POSITIVE COCCI IN CLUSTERS RARE YEAST Performed at [facility]    Report Status 05/05/2015 FINAL  Final     Studies: No results found.  Scheduled Meds: . cefTAZidime (FORTAZ)  IV  1 g Intravenous Q24H  . heparin subcutaneous  5,000 Units Subcutaneous 3 times per day  . levothyroxine  44 mcg Intravenous Daily  . metoprolol  2.5 mg Intravenous Q12H  . vancomycin  500 mg Intravenous Q48H   Continuous Infusions: .  sodium bicarbonate  150 mEq in sterile water 1000 mL infusion 100 mL/hr at 05/06/15 0020    Principal Problem:   Acute renal failure (HCC) Active Problems:   Chronic hepatitis C without hepatic coma (HCC)   Hypothyroidism   Hyperkalemia   Chronic atrial fibrillation (HCC)   Acute renal failure (ARF) ([place])   General weakness   Palliative care encounter   Weakness generalized   Abdominal pain   Obstructive jaundice   Protein-calorie malnutrition, severe   Acidosis   Time spent: 30 minutes   Barton Dubois  [facility] Pager [phone]. If 7PM-7AM, please contact night-coverage at [email or web address], password [facility] 05/06/2015, 7:16 AM  LOS: 4 days

## 2015-05-06 NOTE — Progress Notes (Signed)
    [facility]   Date of Admission:  05/20/2015  Antibiotics: Vancomycin/ceftaz  Subjective: No acute events  Objective: Temp:  [97.5 F (36.4 C)-97.8 F (36.6 C)] 97.8 F (36.6 C) (10/07 1358) Pulse Rate:  [75-84] 75 (10/07 1358) Resp:  [14-18] 14 (10/07 1358) BP: (91-113)/(46-61) 93/52 mmHg (10/07 1358) SpO2:  [98 %-100 %] 98 % (10/07 1358) Weight:  [108 lb (48.988 kg)] 108 lb (48.988 kg) (10/07 0700)  General: awake, does not respond Skin: jaundic Lungs: CTA B Cor: RRR Abdomen: soft, nt, nd Ext: no edema  Review of systems not obtained due to patient factors.  Lab Results Lab Results  Component Value Date   WBC 10.6* 05/05/2015   HGB 13.2 05/05/2015   HCT 35.7* 05/05/2015   MCV 86.0 05/05/2015   PLT 127* 05/05/2015    Lab Results  Component Value Date   CREATININE 2.15* 05/06/2015   BUN 109* 05/06/2015   NA 145 05/06/2015   K 4.9 05/06/2015   CL 103 05/06/2015   CO2 27 05/06/2015    Lab Results  Component Value Date   ALT 49 05/06/2015   AST 136* 05/06/2015   ALKPHOS 173* 05/06/2015   BILITOT 19.4* 05/06/2015      Microbiology: Recent Results (from the past 240 hour(s))  MRSA PCR Screening     Status: None   Collection Time: 05/26/2015 11:18 PM  Result Value Ref Range Status   MRSA by PCR NEGATIVE NEGATIVE Final    Comment:        The GeneXpert MRSA Assay (FDA approved for NASAL specimens only), is one component of a comprehensive MRSA colonization surveillance program. It is not intended to diagnose MRSA infection nor to guide or monitor treatment for MRSA infections.   Culture, body fluid-bottle     Status: None (Preliminary result)   Collection Time: 05/04/15  9:00 PM  Result Value Ref Range Status   Specimen Description FLUID  Final   Special Requests BILE  Final   Gram Stain   Final    GRAM POSITIVE COCCI IN CLUSTERS IN BOTH AEROBIC AND ANAEROBIC BOTTLES CRITICAL RESULT CALLED TO, READ BACK BY AND  VERIFIED WITH: VALERIE STEWART @0555  05/05/15 MKELLY    Culture   Final    GRAM POSITIVE COCCI Performed at [facility]    Report Status PENDING  Incomplete  Gram stain     Status: None   Collection Time: 05/04/15  9:00 PM  Result Value Ref Range Status   Specimen Description FLUID  Final   Special Requests BILE  Final   Gram Stain   Final    NO WBC SEEN FEW GRAM POSITIVE COCCI IN CLUSTERS RARE YEAST Performed at [facility]    Report Status 05/05/2015 FINAL  Final    Studies/Results: No results found.  Assessment/Plan:  1) liver failure/biliary obstruction - unclear etiology.  ? If cancer or not.   ? If hepatology would be of benefit (at academic center) 2) [place] - doubt infection and will stop antibiotics  I will be available over the weekend if needed  Scharlene Gloss, [facility] [employer] [email or web address] [phone] pager   [phone] cell 05/06/2015, 3:15 PM

## 2015-05-07 DIAGNOSIS — Z515 Encounter for palliative care: Secondary | ICD-10-CM

## 2015-05-07 LAB — GLUCOSE, CAPILLARY
GLUCOSE-CAPILLARY: 79 mg/dL (ref 65–99)
GLUCOSE-CAPILLARY: 125 mg/dL — AB (ref 65–99)
Glucose-Capillary: 127 mg/dL — ABNORMAL HIGH (ref 65–99)
Glucose-Capillary: 96 mg/dL (ref 65–99)

## 2015-05-07 LAB — COMPREHENSIVE METABOLIC PANEL
ALBUMIN: 1.8 g/dL — AB (ref 3.5–5.0)
ALT: 53 U/L (ref 14–54)
AST: 141 U/L — AB (ref 15–41)
Alkaline Phosphatase: 160 U/L — ABNORMAL HIGH (ref 38–126)
Anion gap: 16 — ABNORMAL HIGH (ref 5–15)
BUN: 111 mg/dL — AB (ref 6–20)
CHLORIDE: 91 mmol/L — AB (ref 101–111)
CO2: 35 mmol/L — AB (ref 22–32)
Calcium: 6.3 mg/dL — CL (ref 8.9–10.3)
Creatinine, Ser: 2.73 mg/dL — ABNORMAL HIGH (ref 0.44–1.00)
GFR calc Af Amer: 17 mL/min — ABNORMAL LOW (ref >60)
GFR calc non Af Amer: 15 mL/min — ABNORMAL LOW (ref >60)
GLUCOSE: 100 mg/dL — AB (ref 65–99)
POTASSIUM: 3.4 mmol/L — AB (ref 3.5–5.1)
SODIUM: 142 mmol/L (ref 135–145)
Total Bilirubin: 19 mg/dL (ref 0.3–1.2)
Total Protein: 4.9 g/dL — ABNORMAL LOW (ref 6.5–8.1)

## 2015-05-07 LAB — AMMONIA: AMMONIA: 69 umol/L — AB (ref 9–35)

## 2015-05-07 MED ORDER — ENSURE ENLIVE PO LIQD: 237.0000 mL | Freq: Three times a day (TID) | ORAL | Status: DC

## 2015-05-07 MED ORDER — SODIUM CHLORIDE 0.9 % IV SOLN
INTRAVENOUS | Status: DC
  Administered 2015-05-07: 09:00:00 via INTRAVENOUS

## 2015-05-07 NOTE — Progress Notes (Signed)
Patient ID: Diana Pearson, female   DOB: 11-Jun-1929, 81 y.o.   MRN: [id number]  [facility] PROGRESS NOTE  Christiane Sistare TMA:[id number] DOB: 08-30-1928 DOA: 05/21/2015 PCP: [facility]  Assessment/Plan: Generalized weakness/active declining and severe protein calorie malnutrition  - Likely in the setting of multiple issues including but not limited acute renal failure, dehydration and hyperkalemia - At this time, family prefers to talk to palliative care; will follow up on recommendations for [place] and advance directives  -she has remained full code -she has remained unable to eat, but drinking more according to family -Obtain PT evaluation when pt able to participate.  Confusion/encephalopathy: multifactorial, due to elevated bilirubin, dehydration, electrolytes abnormalities and elevated ammonia  -slightly better -ammonia is trending down -will continue use some lactulose to improve numbers as per patient's family request  Hyperkalemia -Current potassium 3.4 -will follow electrolytes   Acute renal failure: with concerns for hepatorenal syndrome -secondary to dehydration and pre-renal azotemia -will switch IVF's to NS; bicarb is 35 today -metabolic acidosis essentially resolved -Follow up renal function trend -Cr 2.73 and unchanged GFR categorizing her in stage 4-5 currently  Severe protein calorie malnutrition/hypoalbuminemia -patient unable to eat or drink properly given degree of obtundation  -palliative is on board and will addressed GOC, invasive/artificial nutrition and decision of further care  Obstructive Jaundice/liver mass -due to ongoing cirrhosis with biliary obstruction and elevated bilirubin -will follow IR rec's -patient is frail, but patient's family optimistic about procedures that can be done to help patient in acute setting -status post exchange biliary drain catheter (upsizing to a 12 french catheter); bilirubin has slowly trend down some.  Will monitor   -appreciate GI inputs; due to coagulopathy no further intervention by IR anticipated -GPC growing in biliary cx demonstrated to be a contaminant; antibiotics discontinued   Hx of severe aortic stenosis  Will follow balance as she is on IV fluids No signs of fluid overload at this moment and no SOB or crackles on exam  Hx of atrial fibrillation Not a candidate for anticoagulation CHADsVASC score 3  Hypothyroidism Will continue synthroid   Hypocalcemia -associated with renal failure most likely -once tolerated PO's will initiate calcitriol -for now will use intermittent IV repletion for maintenance as family requesting   DVT prophylaxis: SCD's -given bloody drainage from biliary drain will hold on heparin  Code Status: Full Code Family Communication: daughter at bedside Disposition Plan: remains inpatient, will follow electrolytes and response to her bilirubin level   Consultants:  ID  IR  Oncology service  Palliative Care  GI  Procedures:  Up sizing percutaneous biliary catheter 05/04/15    Antibiotics:  None   HPI/Subjective: Afebrile, no CP, no SOB. Patient remains jaundice and is experiencing some bloody drainage out of her biliary drain. INR 3.9  Objective: Filed Vitals:   05/07/15 1403  BP: 97/48  Pulse: 76  Temp: 97.4 F (36.3 C)  Resp: 14    Intake/Output Summary (Last 24 hours) at 05/07/15 1713 Last data filed at 05/07/15 0656  Gross per 24 hour  Intake   1195 ml  Output    450 ml  Net    745 ml   Filed Weights   05/05/15 0410 05/06/15 0700 05/07/15 0500  Weight: 50.168 kg (110 lb 9.6 oz) 48.988 kg (108 lb) 50.621 kg (111 lb 9.6 oz)    Exam: General: Appears malnourished, frail and chronically ill; remains jaundiced, weak and no eating much. She is somewhat more alert and  per family drinking some. INR is elevated and overall poor prognosis unchanged. (liver failure, renal failure and severe protein calorie  malnutrition) CVS: RRR, S1/S2 appreciated  Pulmonary: Effort and breath sounds normal, no stridor, rhonchi, wheezes, rales.  Abdominal: Soft. BS +,  no distension, tenderness, rebound or guarding; percutaneous drain in place  Musculoskeletal: No edema and no tenderness.  Neuro: No focal deficits  Data Reviewed: Basic Metabolic Panel:  Recent Labs Lab 05/04/15 0906 05/04/15 1450 05/05/15 0427 05/06/15 0800 05/07/15 0540  NA 141 141 144 145 142  K 6.7* 5.0 4.4 4.9 3.4*  CL 118* 120* 111 103 91*  CO2 9* 10* 18* 27 35*  GLUCOSE 114* 119* 108* 117* 100*  BUN 126* 124* 114* 109* 111*  CREATININE 2.33* 2.46* 2.47* 2.15* 2.73*  CALCIUM 6.9* 7.3* 6.8* 6.3* 6.3*   Liver Function Tests:  Recent Labs Lab 05/03/15 0350 05/04/15 1450 05/05/15 0427 05/06/15 0800 05/07/15 0540  AST 132* 151* 127* 136* 141*  ALT 53 57* 48 49 53  ALKPHOS 170* 176* 171* 173* 160*  BILITOT 22.0* 22.9* 19.2* 19.4* 19.0*  PROT 5.9* 5.5* 5.2* 5.1* 4.9*  ALBUMIN 2.1* 1.9* 1.9* 1.8* 1.8*    Recent Labs Lab 05/01/2015 2005  LIPASE 56*    Recent Labs Lab 05/04/15 1900 05/07/15 0540  AMMONIA 94* 69*   CBC:  Recent Labs Lab 05/26/2015 2005 05/03/15 0350 05/05/15 0628  WBC 14.6* 14.0* 10.6*  NEUTROABS  --  12.3* 9.7*  HGB 16.0* 14.7 13.2  HCT 44.4 41.8 35.7*  MCV 88.3 90.1 86.0  PLT 232 173 127*   CBG:  Recent Labs Lab 05/06/15 1204 05/06/15 1757 05/07/15 0028 05/07/15 0601 05/07/15 1150  GLUCAP 138* 163* 127* 96 79    Recent Results (from the past 240 hour(s))  MRSA PCR Screening     Status: None   Collection Time: 05/28/2015 11:18 PM  Result Value Ref Range Status   MRSA by PCR NEGATIVE NEGATIVE Final    Comment:        The GeneXpert MRSA Assay (FDA approved for NASAL specimens only), is one component of a comprehensive MRSA colonization surveillance program. It is not intended to diagnose MRSA infection nor to guide or monitor treatment for MRSA infections.   Culture,  body fluid-bottle     Status: None (Preliminary result)   Collection Time: 05/04/15  9:00 PM  Result Value Ref Range Status   Specimen Description FLUID  Final   Special Requests BILE  Final   Gram Stain   Final    GRAM POSITIVE COCCI IN CLUSTERS IN BOTH AEROBIC AND ANAEROBIC BOTTLES CRITICAL RESULT CALLED TO, READ BACK BY AND VERIFIED WITH: VALERIE STEWART @0555  05/05/15 MKELLY    Culture   Final    STAPHYLOCOCCUS SPECIES (COAGULASE NEGATIVE) HOLDING FOR POSSIBLE ANAEROBE    Report Status PENDING  Incomplete   Organism ID, Bacteria STAPHYLOCOCCUS SPECIES (COAGULASE NEGATIVE)  Final      Susceptibility   Staphylococcus species (coagulase negative) - MIC*    CIPROFLOXACIN >=8 RESISTANT Resistant     ERYTHROMYCIN <=0.25 SENSITIVE Sensitive     GENTAMICIN <=0.5 SENSITIVE Sensitive     OXACILLIN <=0.25 SENSITIVE Sensitive     TETRACYCLINE <=1 SENSITIVE Sensitive     VANCOMYCIN 1 SENSITIVE Sensitive     TRIMETH/SULFA <=10 SENSITIVE Sensitive     CLINDAMYCIN <=0.25 SENSITIVE Sensitive     RIFAMPIN <=0.5 SENSITIVE Sensitive     Inducible Clindamycin Value in next row Sensitive  NEGATIVEPerformed at [facility]    * STAPHYLOCOCCUS SPECIES (COAGULASE NEGATIVE)  Gram stain     Status: None   Collection Time: 05/04/15  9:00 PM  Result Value Ref Range Status   Specimen Description FLUID  Final   Special Requests BILE  Final   Gram Stain   Final    NO WBC SEEN FEW GRAM POSITIVE COCCI IN CLUSTERS RARE YEAST Performed at [facility]    Report Status 05/05/2015 FINAL  Final     Studies: No results found.  Scheduled Meds: . feeding supplement (ENSURE ENLIVE)  237 mL Oral TID BM  . lactulose  20 g Oral BID  . levothyroxine  44 mcg Intravenous Daily  . metoprolol  2.5 mg Intravenous Q12H   Continuous Infusions: . sodium chloride 75 mL/hr at 05/07/15 0849    Principal Problem:   Acute renal failure (HCC) Active Problems:   Chronic hepatitis C without  hepatic coma (HCC)   Hypothyroidism   Hyperkalemia   Chronic atrial fibrillation (HCC)   Acute renal failure (ARF) (HCC)   General weakness   Palliative care encounter   Weakness generalized   Abdominal pain   Obstructive jaundice   Protein-calorie malnutrition, severe   Acidosis   Time spent: 30 minutes   Barton Dubois  [facility] Pager [phone]. If 7PM-7AM, please contact night-coverage at [email or web address], password [facility] 05/07/2015, 5:13 PM  LOS: 5 days

## 2015-05-07 NOTE — Progress Notes (Signed)
Patient with bloody out from  Biliary drain and heparin scheduled, notified May Lynch and she okayed to hold heparin. Will continue to assess patient.

## 2015-05-07 NOTE — Progress Notes (Signed)
CRITICAL VALUE ALERT  Critical value received:  Calcium-6.3 and total bilirum   Date of notification:  05/07/15   Time of notification:  0631  Critical value read back:Yes.    Nurse who received alert:  Sheffield Slider  MD notified (1st page):  Overton Mam  Time of first page:  [phone]  MD notified (2nd page):  Time of second page:  Responding MD:  Overton Mam  Time MD responded:  [phone]

## 2015-05-07 NOTE — Progress Notes (Signed)
Cross cover [facility] Subjective: Patient examined and chart reviewed. Very complicated [place] tumor in a 81 year old with multiple co-morbidities in the setting of chronic Hepatitis C. Patient seems fatigued and in pain;not able to answer questions. There are 2 daughters and several small children sleeping in the room with her.    Objective: Vital signs in last 24 hours: Temp:  [97.7 F (36.5 C)-97.8 F (36.6 C)] 97.7 F (36.5 C) (10/07 2046) Pulse Rate:  [75-83] 77 (10/07 2046) Resp:  [14-18] 18 (10/07 2046) BP: (93-109)/(50-61) 97/50 mmHg (10/07 2046) SpO2:  [98 %] 98 % (10/07 2046) Weight:  [50.621 kg (111 lb 9.6 oz)] 50.621 kg (111 lb 9.6 oz) (10/08 0500)    Intake/Output from previous day: 10/07 0701 - 10/08 0700 In: 2448.8 [P.O.:60; I.V.:2388.8] Out: 670 [Drains:670] Intake/Output this shift:    General appearance: cachectic, fatigued, pale and slowed mentation Resp: clear to auscultation bilaterally Cardio: regular rate and rhythm, S1, S2 normal, no murmur, click, rub or gallop GI: soft, diffusely tender on palpation with hypoactive bowel sounds; no masses,  no organomegaly;  bloody discharge in the biliary drain Extremities: extremities normal, atraumatic, no cyanosis or edema  Lab Results:  Recent Labs  05/05/15 0628  WBC 10.6*  HGB 13.2  HCT 35.7*  PLT 127*   BMET  Recent Labs  05/05/15 0427 05/06/15 0800 05/07/15 0540  NA 144 145 142  K 4.4 4.9 3.4*  CL 111 103 91*  CO2 18* 27 35*  GLUCOSE 108* 117* 100*  BUN 114* 109* 111*  CREATININE 2.47* 2.15* 2.73*  CALCIUM 6.8* 6.3* 6.3*   LFT  Recent Labs  05/07/15 0540  PROT 4.9*  ALBUMIN 1.8*  AST 141*  ALT 53  ALKPHOS 160*  BILITOT 19.0*   PT/INR  Recent Labs  05/05/15 0427  LABPROT 37.5*  INR 3.93*   Medications: I have reviewed the patient's current medications.  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 .  LOS: 5 days   Diamonte Stavely 05/07/2015, 7:46 AM

## 2015-05-07 NOTE — Progress Notes (Signed)
Patient had two large black colored stools prior to shift change.  Informed Johnnye Lana, RN whom will continue to monitor and notify MD.

## 2015-05-07 NOTE — Progress Notes (Signed)
Patient ID: Diana Pearson, female   DOB: September 26, 1928, 81 y.o.   MRN: [id number]         Subjective:  Pt lethargic/obtunded, jaundiced , opens eyes slightly; no family in room  Allergies: Review of patient's allergies indicates no known allergies.  Medications: Prior to Admission medications   Medication Sig Start Date End Date Taking? Authorizing Provider  levothyroxine (SYNTHROID, LEVOTHROID) 88 MCG tablet Take 1 tablet (88 mcg total) by mouth daily before breakfast. 04/15/15  Yes Albertine Patricia, MD  metoprolol tartrate (LOPRESSOR) 25 MG tablet Take 0.5 tablets (12.5 mg total) by mouth 2 (two) times daily. 04/15/15  Yes Silver Huguenin Elgergawy, MD  ondansetron (ZOFRAN) 4 MG tablet Take 1 tablet (4 mg total) by mouth every 6 (six) hours as needed for nausea. 04/15/15  Yes Albertine Patricia, MD  OVER THE COUNTER MEDICATION Take 1 capsule by mouth 4 (four) times daily. Biofilm Detox   Yes Historical Provider, MD  OVER THE COUNTER MEDICATION Take 1 capsule by mouth 4 (four) times daily. Homeopathic supplement   Yes Historical Provider, MD  OVER THE COUNTER MEDICATION Take 1 capsule by mouth 2 (two) times daily. ATP Max   Yes Historical Provider, MD  OVER THE COUNTER MEDICATION Take 10 drops by mouth daily. Chelidonium   Yes Historical Provider, MD  oxyCODONE (ROXICODONE) 5 MG immediate release tablet Take 1 tablet (5 mg total) by mouth every 6 (six) hours as needed for severe pain. 04/15/15  Yes Albertine Patricia, MD  TURMERIC PO Take 1 capsule by mouth daily.   Yes Historical Provider, MD  feeding supplement, ENSURE ENLIVE, (ENSURE ENLIVE) LIQD Take 237 mLs by mouth 2 (two) times daily between meals. Patient not taking: Reported on 04/22/2015 04/15/15   Albertine Patricia, MD     Vital Signs: BP 97/48 mmHg  Pulse 76  Temp(Src) 97.4 F (36.3 C) (Oral)  Resp 14  Ht 4\' 6"  (1.372 m)  Wt 111 lb 9.6 oz (50.621 kg)  BMI 26.89 kg/m2  SpO2 98%  Physical Exam biliary drain intact, small amt  bleeding /clot around insertion site; output 670 cc dark bile; cx's - staph  Imaging: Ir Exchange Biliary Drain  05/04/2015   INDICATION: History of central hepatic tumor, potentially a cholangiocarcinoma or hepatocellular carcinoma with resultant biliary obstruction.  The patient underwent a right-sided percutaneous external biliary drainage catheter placement on 04/09/2015 and subsequently a left sided internal/external biliary drainage catheter on 04/13/2015. Patient returned on 04/24/2013 for percutaneous cholangiogram with subsequent removal of the right-sided percutaneous biliary drainage catheter.  Patient now re-admitted to the hospital with persistent hyper bilirubinemia and dehydration.  Please perform percutaneous biliary drainage of cholangiogram and potential biliary drainage up sizing as clinically indicated.  EXAM: 1. CHOLANGIOGRAM VIA EXISTING PERCUTANEOUS BILIARY DRAINAGE CATHETER 2. FLUOROSCOPIC GUIDED LEFT-SIDED PERCUTANEOUS BILIARY DRAINAGE CATHETER  COMPARISON:  CT abdomen pelvis - 04/07/2015; 05/07/2015; fluoroscopic guided right-sided percutaneous biliary drainage catheter placement - 04/09/2015; ultrasound and fluoroscopic guided left-sided percutaneous biliary drainage catheter placement - 04/13/2015; cholangiogram via existing percutaneous biliary drainage catheter with subsequent right-sided biliary drainage catheter removal -04/25/2015  CONTRAST:  20 cc Omnipaque 300, administered into the biliary tree  FLUOROSCOPY TIME:  1 minute 54 seconds (16 mGy)  COMPLICATIONS: None immediate  TECHNIQUE: Informed written consent was obtained from the patient's family after a discussion of the risks, benefits and alternatives to treatment. Questions regarding the procedure were encouraged and answered. A timeout was performed prior to the initiation of the procedure.  The external portion of the existing left-sided percutaneous biliary drainage catheter as well as the surrounding skin were prepped  and draped in the usual sterile fashion. A sterile drape was applied covering the operative field. Maximum barrier sterile technique with sterile gowns and gloves were used for the procedure. A timeout was performed prior to the initiation of the procedure.  A pre procedural spot fluoroscopic image was obtained of the right upper abdominal quadrant and the existing residual left-sided percutaneous biliary drainage catheter. Multiple spot fluoroscopic and radiographic images were obtained following the injection of a small amount of contrast via the existing percutaneous biliary drainage catheter. The existing biliary drainage catheter was cut and cannulated with a Amplatz wire which was coiled within the proximal small bowel. Under intermittent fluoroscopic guidance, the existing PBD was exchanged for a new slightly larger now 12 [place] PBD. Small amount of contrast was injected via the exchanged biliary drainage catheter and a post exchange spot fluoroscopic image was obtained. The catheter was locked and secured to the skin with a single interrupted suture. The biliary drainage catheter was capped. A dressing was placed. The patient tolerated the procedure well without immediate postprocedural complication.  FINDINGS: The existing percutaneous biliary catheter is appropriately positioned and functioning, however there is no definitive opacification of the right biliary tree correlating with the apparent residual / recurrent intrahepatic biliary duct dilatation involving the right lobe of the liver on noncontrast abdominal CT obtained 04/30/2015.  After successful fluoroscopic guided exchange and up sizing, the new 12 French percutaneous biliary catheter is appropriately positioned with end coiled and locked within the proximal duodenum.  IMPRESSION: Successful fluoroscopic guided exchange and up sizing of a now 35 French percutaneous biliary drainage catheter.  PLAN: Would recommend obtaining serial  bilirubin levels. If patient's bilirubin levels remain elevated, the patient may require placement of a new right-sided percutaneous biliary drainage catheter as it appears the left and right biliary trees no longer communicate.   Electronically Signed   By: Sandi Mariscal M.D.   On: 05/04/2015 11:03    Labs:  CBC:  Recent Labs  04/25/15 0953 05/04/2015 2005 05/03/15 0350 05/05/15 0628  WBC 12.4* 14.6* 14.0* 10.6*  HGB 14.8 16.0* 14.7 13.2  HCT 41.1 44.4 41.8 35.7*  PLT 174 232 173 127*    COAGS:  Recent Labs  04/06/15 1405  04/08/15 0720 04/08/15 1316 04/12/15 0546 04/25/15 0953 05/05/15 0427  INR 1.44  < > 1.59*  --  1.46 1.65* 3.93*  APTT 30  --   --  34 33  --   --   < > = values in this interval not displayed.  BMP:  Recent Labs  05/04/15 1450 05/05/15 0427 05/06/15 0800 05/07/15 0540  NA 141 144 145 142  K 5.0 4.4 4.9 3.4*  CL 120* 111 103 91*  CO2 10* 18* 27 35*  GLUCOSE 119* 108* 117* 100*  BUN 124* 114* 109* 111*  CALCIUM 7.3* 6.8* 6.3* 6.3*  CREATININE 2.46* 2.47* 2.15* 2.73*  GFRNONAA 17* 17* 20* 15*  GFRAA 20* 20* 23* 17*    LIVER FUNCTION TESTS:  Recent Labs  05/04/15 1450 05/05/15 0427 05/06/15 0800 05/07/15 0540  BILITOT 22.9* 19.2* 19.4* 19.0*  AST 151* 127* 136* 141*  ALT 57* 48 49 53  ALKPHOS 176* 171* 173* 160*  PROT 5.5* 5.2* 5.1* 4.9*  ALBUMIN 1.9* 1.9* 1.8* 1.8*    Assessment and Plan: Pt with history of hepatitis C, cirrhosis, central liver mass  with biliary obstruction, status post exchange and upsizing of pre-existing left biliary drain catheter to 12 [place] on 05/04/15; afebrile but markedly lethargic; BP soft; creatup to 2.73, ammonia 69, PT/INR 37.5/3.93. Agree with holding heparin, palliative care.    Signed: D. Rowe Robert 05/07/2015, 2:49 PM   I spent a total of 15 minutes atpts's bedside AND on the patient's hospital floor or unit, greater than 50% of which was counseling/coordinating care for biliary  drain

## 2015-05-08 DIAGNOSIS — R791 Abnormal coagulation profile: Secondary | ICD-10-CM

## 2015-05-08 LAB — BLOOD GAS, ARTERIAL
Acid-base deficit: 4.9 mmol/L — ABNORMAL HIGH (ref 0.0–2.0)
Bicarbonate: 17.9 mEq/L — ABNORMAL LOW (ref 20.0–24.0)
Drawn by: 225631
FIO2: 0.21
O2 Saturation: 90.3 %
PATIENT TEMPERATURE: 98.6
TCO2: 16.2 mmol/L (ref 0–100)
pCO2 arterial: 28 mmHg — ABNORMAL LOW (ref 35.0–45.0)
pH, Arterial: 7.422 (ref 7.350–7.450)
pO2, Arterial: 68.8 mmHg — ABNORMAL LOW (ref 80.0–100.0)

## 2015-05-08 LAB — GLUCOSE, CAPILLARY
GLUCOSE-CAPILLARY: 150 mg/dL — AB (ref 65–99)
GLUCOSE-CAPILLARY: 77 mg/dL (ref 65–99)
Glucose-Capillary: 27 mg/dL — CL (ref 65–99)
Glucose-Capillary: 187 mg/dL — ABNORMAL HIGH (ref 65–99)
Glucose-Capillary: 231 mg/dL — ABNORMAL HIGH (ref 65–99)
Glucose-Capillary: 143 mg/dL — ABNORMAL HIGH (ref 65–99)
Glucose-Capillary: 90 mg/dL (ref 65–99)
Glucose-Capillary: 72 mg/dL (ref 65–99)

## 2015-05-08 LAB — COMPREHENSIVE METABOLIC PANEL
ALBUMIN: 1.7 g/dL — AB (ref 3.5–5.0)
ALK PHOS: 152 U/L — AB (ref 38–126)
ALT: 99 U/L — ABNORMAL HIGH (ref 14–54)
ANION GAP: 29 — AB (ref 5–15)
AST: 379 U/L — ABNORMAL HIGH (ref 15–41)
BILIRUBIN TOTAL: 18.1 mg/dL — AB (ref 0.3–1.2)
BUN: 108 mg/dL — ABNORMAL HIGH (ref 6–20)
CALCIUM: 5.9 mg/dL — AB (ref 8.9–10.3)
CO2: 19 mmol/L — ABNORMAL LOW (ref 22–32)
Chloride: 92 mmol/L — ABNORMAL LOW (ref 101–111)
Creatinine, Ser: 3.18 mg/dL — ABNORMAL HIGH (ref 0.44–1.00)
GFR calc Af Amer: 14 mL/min — ABNORMAL LOW (ref >60)
GFR, EST NON AFRICAN AMERICAN: 13 mL/min — AB (ref >60)
GLUCOSE: 153 mg/dL — AB (ref 65–99)
Potassium: 4.2 mmol/L (ref 3.5–5.1)
Sodium: 140 mmol/L (ref 135–145)
TOTAL PROTEIN: 4.6 g/dL — AB (ref 6.5–8.1)

## 2015-05-08 LAB — MAGNESIUM: Magnesium: 2.8 mg/dL — ABNORMAL HIGH (ref 1.7–2.4)

## 2015-05-08 MED ORDER — DEXTROSE-NACL 5-0.9 % IV SOLN
INTRAVENOUS | Status: DC
  Administered 2015-05-08: 16:00:00 via INTRAVENOUS

## 2015-05-08 MED ORDER — DEXTROSE-NACL 5-0.9 % IV SOLN
INTRAVENOUS | Status: DC
  Administered 2015-05-08: 08:00:00 via INTRAVENOUS

## 2015-05-08 MED ORDER — CALCITRIOL 1 MCG/ML IV SOLN
1.0000 ug | INTRAVENOUS | Status: DC
  Filled 2015-05-08: qty 1

## 2015-05-08 MED ORDER — PANTOPRAZOLE SODIUM 40 MG IV SOLR
40.0000 mg | INTRAVENOUS | Status: DC
  Administered 2015-05-08: 40 mg via INTRAVENOUS
  Filled 2015-05-08: qty 40

## 2015-05-08 MED ORDER — DEXTROSE 5 % IV SOLN
5.0000 mg | Freq: Once | INTRAVENOUS | Status: AC
  Administered 2015-05-08: 5 mg via INTRAVENOUS
  Filled 2015-05-08: qty 0.5

## 2015-05-08 MED ORDER — VITAMINS A & D EX OINT
TOPICAL_OINTMENT | CUTANEOUS | Status: AC
  Administered 2015-05-08: 5
  Filled 2015-05-08: qty 5

## 2015-05-08 MED ORDER — DEXTROSE 50 % IV SOLN
INTRAVENOUS | Status: AC
  Administered 2015-05-08: 50 mL
  Filled 2015-05-08: qty 50

## 2015-05-08 NOTE — Progress Notes (Signed)
Was notified around 0615 that pt's CBG was 27. Administered full D50 ampule, CBG increased to 235, then 187. Noticed that pt was more lethargic, not responsive to pain. BP was 86/43 but other VS stable. Notified RRT, then PM hospitalist, and contacted attending MD for day shift. Labs collected at bedside. Report handoff given to next shift. MD awaiting lab results before placing new orders.

## 2015-05-08 NOTE — Progress Notes (Signed)
Cross cover  LHC-GI Subjective: Since I last evaluated the patient, she seems to be more disoriented and jaundiced. She has family at bedside.   Objective: Vital signs in last 24 hours: Temp:  [97.4 F (36.3 C)-97.9 F (36.6 C)] 97.9 F (36.6 C) (10/09 0641) Pulse Rate:  [60-79] 60 (10/09 0707) Resp:  [14-28] 28 (10/09 0707) BP: (86-106)/(43-65) 106/44 mmHg (10/09 0707) SpO2:  [95 %-100 %] 95 % (10/09 0641) Last BM Date: 05/07/15  Intake/Output from previous day: 10/08 0701 - 10/09 0700 In: 1588.8 [I.V.:1588.8] Out: 0  Intake/Output this shift: Total I/O In: 141.3 [I.V.:141.3] Out: -   General appearance: icteric, moderate distress, slowed mentation and toxic; moaning and groaning in pain Resp: clear to auscultation bilaterally Cardio: regular rate and rhythm, S1, S2 normal, no murmur, click, rub or gallop GI: soft, distended diffusely tender with hypoactive bowel sounds; no masses,  no organomegaly; drain in place  Lab Results: No results for input(s): WBC, HGB, HCT, PLT in the last 72 hours. BMET  Recent Labs  05/06/15 0800 05/07/15 0540 05/08/15 0500  NA 145 142 140  K 4.9 3.4* 4.2  CL 103 91* 92*  CO2 27 35* 19*  GLUCOSE 117* 100* 153*  BUN 109* 111* 108*  CREATININE 2.15* 2.73* 3.18*  CALCIUM 6.3* 6.3* 5.9*   LFT  Recent Labs  05/08/15 0500  PROT 4.6*  ALBUMIN 1.7*  AST 379*  ALT 99*  ALKPHOS 152*  BILITOT 18.1*   Medications: I have reviewed the patient's current medications.  Assessment/Plan: Chronic Hepatitis C with a [place] tumor-I have encouraged the family to consider palliative care for her. Her daughter is going to discuss this with her family in [place]. . LOS: 6 days   Livie Vanderhoof 05/08/2015, 10:02 AM

## 2015-05-08 NOTE — Progress Notes (Addendum)
Patient ID: Diana Pearson, female   DOB: 04-Feb-1929, 81 y.o.   MRN: [id number]  [facility] PROGRESS NOTE  Dwana Garin ZTI:[id number] DOB: 06-22-1929 DOA: 05/23/2015 PCP: [facility]  Assessment/Plan: Generalized weakness/active declining and severe protein calorie malnutrition  - Likely in the setting of multiple issues including but not limited acute renal failure, dehydration and hyperkalemia - At this time, family prefers to talk to palliative care; will follow up on recommendations for [place] and advance directives  -she is now DNR/DNI -she has remained unable to eat, and this morning was hypoglycemic -will change IVF's to D5NS to maintain stable blood sugar level (as requested by family) -Obtain PT evaluation when pt able to participate.  Confusion/encephalopathy: multifactorial, due to elevated bilirubin, dehydration, electrolytes abnormalities, uremia and elevated ammonia  -slightly better -ammonia is trending down, will monitor -will continue use some lactulose to improve numbers as per patient's family request; but she is not really able to drink much  Hyperkalemia -Current potassium 3.4 -will follow electrolytes   Acute renal failure: with concerns for hepatorenal syndrome -secondary to dehydration and pre-renal azotemia and presumed hepatorenal syndrome  -will continue now D5NS (patient with component of acidosis from starvation most likely) -bicarb 19 -Follow up renal function trend -Cr 3.19 and unchanged GFR categorizing her in stage 4-5 currently -electrolytes stable, except for calcium which is low; will give some calcitriol (family demanding to treat what is treatable and refusing comfort measures); at least has agree to change code status to DNR/DNI  Severe protein calorie malnutrition/hypoalbuminemia -patient unable to eat or drink properly given degree of obtundation  -palliative is on board and will addressed GOC, invasive/artificial  nutrition and decision of further care  Obstructive Jaundice/liver mass/liver failure and elevated INR -due to ongoing cirrhosis with biliary obstruction and elevated bilirubin -will follow IR rec's -patient is frail, but patient's family optimistic about procedures that can be done to help patient in acute setting -status post exchange biliary drain catheter (upsizing to a 12 french catheter); bilirubin continue slowly trending down. Will monitor   -appreciate GI inputs; due to coagulopathy no further intervention by IR anticipated -GPC growing in biliary cx demonstrated to be a contaminant; antibiotics discontinued as per ID rec's -given elevated INR, heparin has been discontinue and vit K will be given  Hx of severe aortic stenosis  Will follow balance as she is on IV fluids No signs of fluid overload at this moment and no SOB or crackles on exam  Hx of atrial fibrillation Not a candidate for anticoagulation CHADsVASC score 3  Hypothyroidism Will discontinue synthroid; given decision of less invasive therapy, patient current condition and looking to minimize non-necessary IV therapies  Hypocalcemia -associated with renal failure most likely -once tolerated PO's will initiate calcitriol supplementation; meanwhile will give IV calcitriol X 1  Hypoglycemia -with worsening in obtunded state -will add some D5 to her IVF's today to maintain CBG stability as requested by family members  DVT prophylaxis: SCD's -given bloody drainage from biliary drain will hold on heparin  Code Status: DNR Family Communication: daughter at bedside Disposition Plan: remains inpatient, will follow electrolytes and response to her bilirubin level   Consultants:  ID  IR  Oncology service  Palliative Care  GI  Procedures:  Up sizing percutaneous biliary catheter 05/04/15    Antibiotics:  None   HPI/Subjective: Afebrile, no CP, no SOB. Patient remains jaundice and is more lethargic  today. Transient episode of severe hypoglycemia and 2 episodes of  large black stools overnights.   Objective: Filed Vitals:   05/08/15 0707  BP: 106/44  Pulse: 60  Temp:   Resp: 28    Intake/Output Summary (Last 24 hours) at 05/08/15 0809 Last data filed at 05/08/15 0753  Gross per 24 hour  Intake   1730 ml  Output      0 ml  Net   1730 ml   Filed Weights   05/05/15 0410 05/06/15 0700 05/07/15 0500  Weight: 50.168 kg (110 lb 9.6 oz) 48.988 kg (108 lb) 50.621 kg (111 lb 9.6 oz)    Exam: General: Appears malnourished, frail and chronically ill; remains jaundiced and with increase lethargy today. Patient found to be hypoglycemic in the range of 27, reversed with D50. Rest of her vital signs are stable and no fever. Poor prognosis in the presence of liver failure, renal failure and severe protein calorie malnutrition. Per nursing staff she also had 2 large blackish stool overnight. CVS: RRR, S1/S2 appreciated  Pulmonary: Effort and breath sounds normal, no stridor, rhonchi, wheezes, rales.  Abdominal: Soft. BS +,  no distension, tenderness, rebound or guarding; percutaneous drain in place  Musculoskeletal: No edema and no tenderness.  Neuro: No focal deficits  Data Reviewed: Basic Metabolic Panel:  Recent Labs Lab 05/04/15 0906 05/04/15 1450 05/05/15 0427 05/06/15 0800 05/07/15 0540  NA 141 141 144 145 142  K 6.7* 5.0 4.4 4.9 3.4*  CL 118* 120* 111 103 91*  CO2 9* 10* 18* 27 35*  GLUCOSE 114* 119* 108* 117* 100*  BUN 126* 124* 114* 109* 111*  CREATININE 2.33* 2.46* 2.47* 2.15* 2.73*  CALCIUM 6.9* 7.3* 6.8* 6.3* 6.3*   Liver Function Tests:  Recent Labs Lab 05/03/15 0350 05/04/15 1450 05/05/15 0427 05/06/15 0800 05/07/15 0540  AST 132* 151* 127* 136* 141*  ALT 53 57* 48 49 53  ALKPHOS 170* 176* 171* 173* 160*  BILITOT 22.0* 22.9* 19.2* 19.4* 19.0*  PROT 5.9* 5.5* 5.2* 5.1* 4.9*  ALBUMIN 2.1* 1.9* 1.9* 1.8* 1.8*    Recent Labs Lab 05/10/2015 2005  LIPASE  56*    Recent Labs Lab 05/04/15 1900 05/07/15 0540  AMMONIA 94* 69*   CBC:  Recent Labs Lab 05/28/2015 2005 05/03/15 0350 05/05/15 0628  WBC 14.6* 14.0* 10.6*  NEUTROABS  --  12.3* 9.7*  HGB 16.0* 14.7 13.2  HCT 44.4 41.8 35.7*  MCV 88.3 90.1 86.0  PLT 232 173 127*   CBG:  Recent Labs Lab 05/08/15 0619 05/08/15 0633 05/08/15 0636 05/08/15 0704 05/08/15 0732  GLUCAP 27* 231* 187* 150* 143*    Recent Results (from the past 240 hour(s))  MRSA PCR Screening     Status: None   Collection Time: 05/14/2015 11:18 PM  Result Value Ref Range Status   MRSA by PCR NEGATIVE NEGATIVE Final    Comment:        The GeneXpert MRSA Assay (FDA approved for NASAL specimens only), is one component of a comprehensive MRSA colonization surveillance program. It is not intended to diagnose MRSA infection nor to guide or monitor treatment for MRSA infections.   Culture, body fluid-bottle     Status: None (Preliminary result)   Collection Time: 05/04/15  9:00 PM  Result Value Ref Range Status   Specimen Description FLUID  Final   Special Requests BILE  Final   Gram Stain   Final    GRAM POSITIVE COCCI IN CLUSTERS IN BOTH AEROBIC AND ANAEROBIC BOTTLES CRITICAL RESULT CALLED TO, READ BACK BY AND  VERIFIED WITH: VALERIE STEWART @0555  05/05/15 MKELLY    Culture   Final    STAPHYLOCOCCUS SPECIES (COAGULASE NEGATIVE) HOLDING FOR POSSIBLE ANAEROBE    Report Status PENDING  Incomplete   Organism ID, Bacteria STAPHYLOCOCCUS SPECIES (COAGULASE NEGATIVE)  Final      Susceptibility   Staphylococcus species (coagulase negative) - MIC*    CIPROFLOXACIN >=8 RESISTANT Resistant     ERYTHROMYCIN <=0.25 SENSITIVE Sensitive     GENTAMICIN <=0.5 SENSITIVE Sensitive     OXACILLIN <=0.25 SENSITIVE Sensitive     TETRACYCLINE <=1 SENSITIVE Sensitive     VANCOMYCIN 1 SENSITIVE Sensitive     TRIMETH/SULFA <=10 SENSITIVE Sensitive     CLINDAMYCIN <=0.25 SENSITIVE Sensitive     RIFAMPIN <=0.5  SENSITIVE Sensitive     Inducible Clindamycin Value in next row Sensitive      NEGATIVEPerformed at [place] (COAGULASE NEGATIVE)  Gram stain     Status: None   Collection Time: 05/04/15  9:00 PM  Result Value Ref Range Status   Specimen Description FLUID  Final   Special Requests BILE  Final   Gram Stain   Final    NO WBC SEEN FEW GRAM POSITIVE COCCI IN CLUSTERS RARE YEAST Performed at [facility]    Report Status 05/05/2015 FINAL  Final     Studies: No results found.  Scheduled Meds: . feeding supplement (ENSURE ENLIVE)  237 mL Oral TID BM  . lactulose  20 g Oral BID  . levothyroxine  44 mcg Intravenous Daily  . pantoprazole (PROTONIX) IV  40 mg Intravenous Q24H  . phytonadione (VITAMIN K) IV  5 mg Intravenous Once   Continuous Infusions: . dextrose 5 % and 0.9% NaCl 50 mL/hr at 05/08/15 0753    Principal Problem:   Acute renal failure (HCC) Active Problems:   Chronic hepatitis C without hepatic coma (HCC)   Hypothyroidism   Hyperkalemia   Chronic atrial fibrillation (HCC)   Acute renal failure (ARF) (HCC)   General weakness   Palliative care encounter   Weakness generalized   Abdominal pain   Obstructive jaundice   Protein-calorie malnutrition, severe   Acidosis   Time spent: 30 minutes   Barton Dubois  [facility] Pager [phone]. If 7PM-7AM, please contact night-coverage at [email or web address], password [facility] 05/08/2015, 8:09 AM  LOS: 6 days

## 2015-05-08 NOTE — Progress Notes (Signed)
CRITICAL VALUE ALERT  Critical value received: Calcium-5.9 and Total Bilirubin-18.1  Date of notification:  05/08/15  Time of notification:  0820  Critical value read back:Yes.    Nurse who received alert:  Sheffield Slider  MD notified (1st page):  Dr. Dyann Kief  Time of first page:  0820  MD notified (2nd page):  Time of second page:  Responding MD:  Dr Dyann Kief  Time MD responded:  [phone]

## 2015-05-08 NOTE — Significant Event (Signed)
Rapid Response Event Note  Overview:  Called to check on pt due low BP.  Pt remains obtunded, family at bedside and praying  over her. BP 60/24 HR 64 R 15 sats 100%. Pt raising left arm up in the air, does not focus, no verbal response. Daughter motioned to me," no more, do not want her resuscitated".      Initial Focused Assessment:   Interventions:   Event Summary: 3:20 pm Dr Dyann Kief in, spoke with family. Will make pt DNR.  Emotional support given.     at      at          [place], Jae Dire

## 2015-05-08 NOTE — Progress Notes (Signed)
Pt's BP 72/39, HR 66, O2 sat 76% on room air. Rapid response called and MD notified. Pt placed on 4L nasal cannula which brought O2 sats up to 93%. MD came up to room, changed code status to DNR per family request.

## 2015-05-08 NOTE — Significant Event (Signed)
Rapid Response Event Note  Overview:      Initial Focused Assessment: Pt lying in bed, is obtunded, juandice in color, monitor with SB to SR having occasional short pauses. AC from night shift had responded to call earlier and lab work ordered.  Pt was hypoglycemic and given Dextrose. Pupils around 78mm with questionable reaction.  Family at bedside, pt does not speak any [place]. 0735 Dr Dyann Kief in and spoke with family.  Lab pending.  Pt noted moving both arms.     Interventions: Labs and ABG drawn.     Event Summary: 0745 BP 91/45 (56). HR 68 R 23 sats 96 on RA.  Will continue to monitor pt on tele unit.    at      at          [place], Jae Dire

## 2015-05-09 LAB — CULTURE, BODY FLUID W GRAM STAIN -BOTTLE

## 2015-05-09 LAB — CULTURE, BODY FLUID-BOTTLE

## 2015-05-17 ENCOUNTER — Ambulatory Visit: Payer: No Typology Code available for payment source | Admitting: Internal Medicine

## 2015-05-31 NOTE — Discharge Summary (Signed)
Death Summary  Holiday Mcmenamin ZES:[id number] DOB: October 29, 1928 DOA: 05/07/15  PCP: [facility] PCP/Office notified: communicated through [place] (electronic medical records)  Admit date: 05-07-2015 Date of Death: May 14, 2015  Final Diagnoses:  Principal Problem:   Acute renal failure ([place]) Active Problems:   Chronic hepatitis C without hepatic coma (HCC)   Hypothyroidism   Hyperkalemia   Chronic atrial fibrillation (HCC)   Acute renal failure (ARF) (HCC)   General weakness   Palliative care encounter   Weakness generalized   Abdominal pain   Obstructive jaundice   Protein-calorie malnutrition, severe   Acidosis   History of present illness:  81 y.o. female who was recently admitted last month for obstructive jaundice with CT abdomen showing abdominal mass and patient had biliary drain placed was brought to the ER after patient was having poor appetite and weakness. As per patient's daughter patient has been doing very poorly over the last 1 week and over the last 2 days patient is unable to even walk. Has eaten anything over the last 2 days. Denies any nausea vomiting diarrhea fever chills or abdominal pain. Labs revealed acute renal failure with hyperkalemia with worsening bilirubin levels. Patient had recently been admitted and had extensive workup done with biopsies done were unrevealing. Patient was eventually referred to [facility] but was unable to keep the appointment due to weakness.   Hospital Course:  Generalized weakness/active declining and severe protein calorie malnutrition  -Likely in the setting of multiple issues including but not limited acute renal failure, dehydration and hyperkalemia -after discussion with palliative care and multiple interventions from different providers, patient's family opted for her to be DNR/DNI (which was the best decision given her current health status) -she remained unable to eat, and 10/9 ended experiencing hypoglycemia and with  use of fentanyl low BP, intermittent episodes of apnea and eventually pass away  -patient die at 12:05am 2015-05-14  Confusion/encephalopathy: multifactorial, due to elevated bilirubin, dehydration, electrolytes abnormalities, uremia and elevated ammonia  -despite slight improving initially, she ended continue declining and with the use of pain medicationslater on, never regain consciousness     Hyperkalemia -treated and last potassium level 3.4  Acute renal failure: with concerns for hepatorenal syndrome -secondary to dehydration, pre-renal azotemia and presumed hepatorenal syndrome  -will continue now D5NS (patient with component of acidosis from starvation most likely) -bicarb 19 -Cr 3.19 and unchanged GFR of 12 categorizing her in stage 4-5 (which was new) -electrolytes stable, except for calcium which was low; will give some calcitriol (family demanding to treat what was treatable and refused full comfort measures) -they agree for patient to be DNR/DNI and to have available fentanyl PRN to ask for it if the feel she was in pain or distress -patient with each dose of fentanyl experienced hypotension and hypoxia; requiring O2 supplementation. 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 -ongoing cirrhosis with biliary obstruction and elevated bilirubin -patient remained jaundice and with elevated bilirubin despite biliary drain and upsizing exchange of the biliary drain catheter (to a 12 french catheter);  -worsening of liver function appreciated (elevated AST, ALT, ALk phos and also INR) -GPC that grew in biliary cx demonstrated to be a contaminant; antibiotics were  discontinued as per ID rec's -given elevated  INR, vit K was given -no further intervention could be done given how frail patient was and the relevant concerns for hepatorenal syndrome; plus elevated INR and worsening LFT's  Hx of severe aortic stenosis  -after use of fentanyl for pain control her resp drive decreased and required 4 L of oxygen, but there was No signs of significant fluid overload at this moment and no crackles on exam  Hx of atrial fibrillation -Not a candidate for anticoagulation -CHADsVASC score 3 -HR was controlled at all time  Hypothyroidism -after discussing with family and given the fact she was already experiencing some signs of edema and we were looking to minimize IV therapies; decision was to stop synthroid. -last TSH on records was WNL  Hypocalcemia -associated with renal failure most likely -despite repletion attempts, calcium remains low.  Hypoglycemia -with worsening in obtunded state; due to low intake and body shut down  -some D5 to her IVF's was added today to maintain CBG stability as requested by family members -but they understood this was the end and will not changed overall outcome   Time: 30 minutes  Signed:  Barton Dubois  [facility] 06/01/2015, 8:26 AM

## 2015-05-31 NOTE — Progress Notes (Signed)
Pt is apneic, pulseless, showing asystole on monitor. Notified CN, AC, and hospitalist. Pronounced death at 39 with Daleen Bo, RN.

## 2015-05-31 DEATH — deceased

## 2016-04-07 IMAGING — CT CT ABD-PEL WO/W CM
2 of 11 series · 12 of 46 positions shown, 18 images · IV contrast (Omnipaque 300)
Comparison: None.

CLINICAL DATA: Elevated LFTs. Abnormal liver ultrasound evaluate
for cirrhosis.

EXAM:
CT ABDOMEN AND PELVIS WITHOUT AND WITH CONTRAST
TECHNIQUE: Multidetector CT imaging of the abdomen and pelvis was performed
following the standard protocol before and following the bolus
administration of intravenous contrast.
CONTRAST:  100mL OMNIPAQUE IOHEXOL 300 MG/ML  SOLN

[Series 6: liver portal-venous · axial · portal-venous · 0.59mm/px · z∈[-420,-99]mm · 10 of 129 slices shown, 15 images]
[im 11/129  soft-tissue]
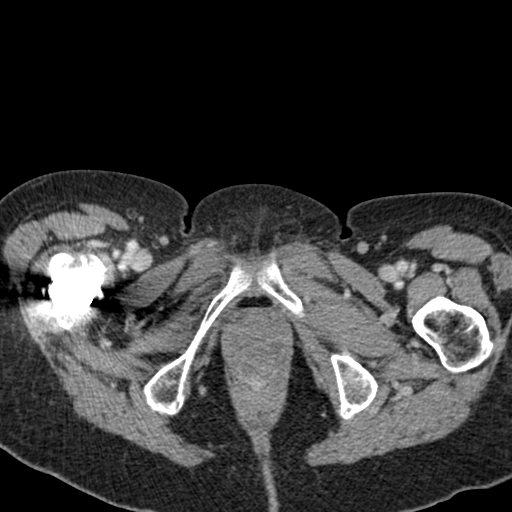
[im 11/129  bone]
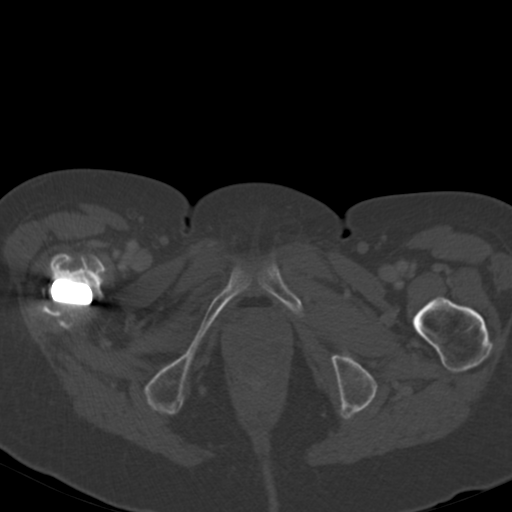
[im 22/129  soft-tissue]
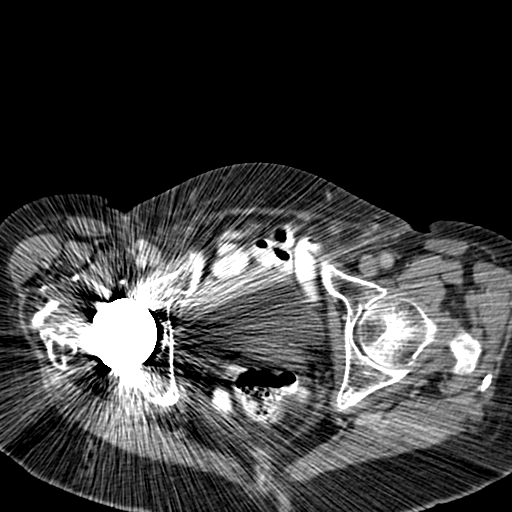
[im 43/129  soft-tissue]
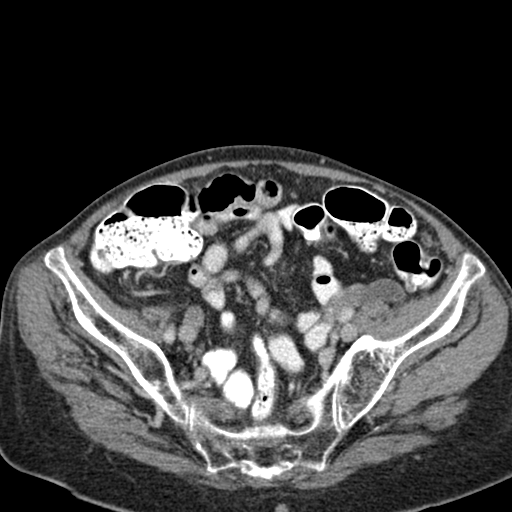
[im 54/129  soft-tissue]
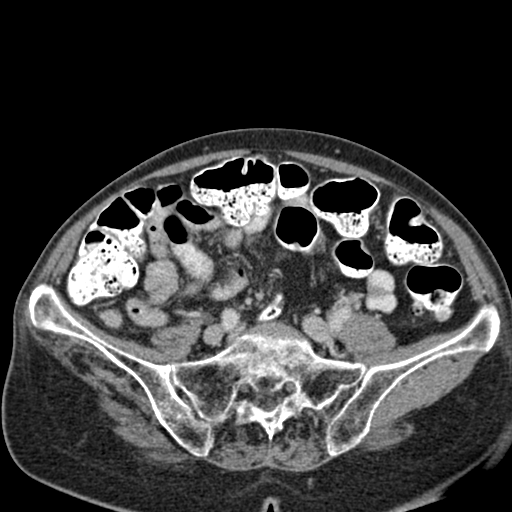
[im 65/129  soft-tissue]
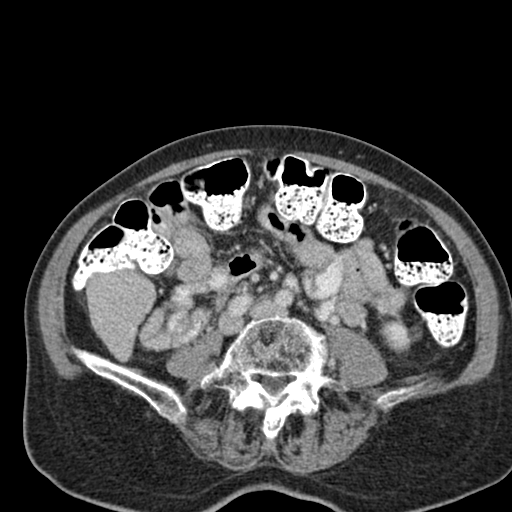
[im 75/129  soft-tissue]
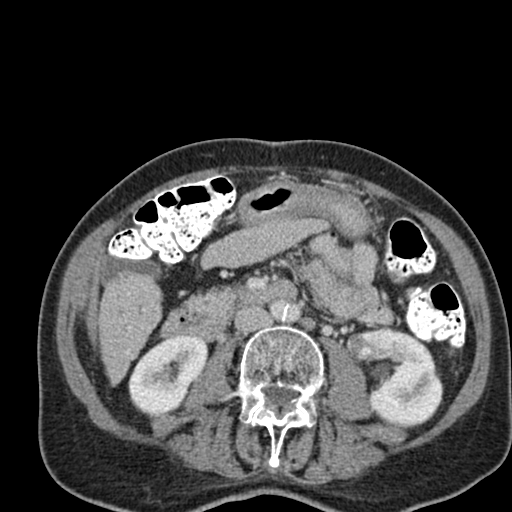
[im 86/129  soft-tissue]
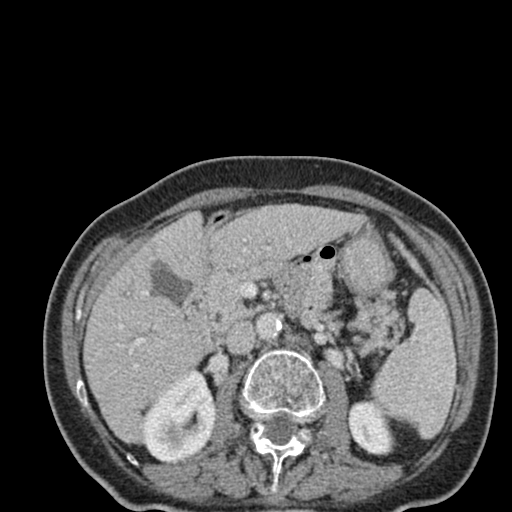
[im 86/129  lung]
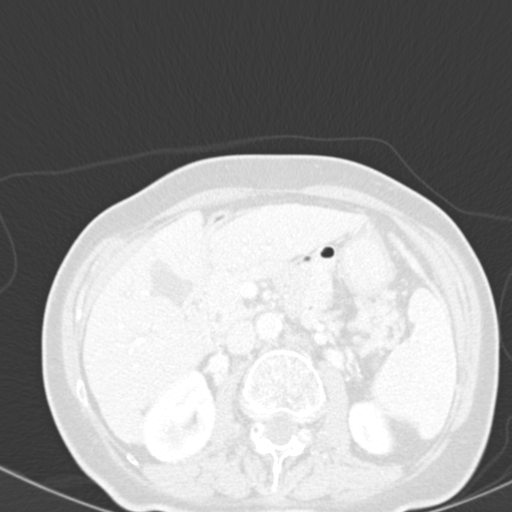
[im 97/129  lung]
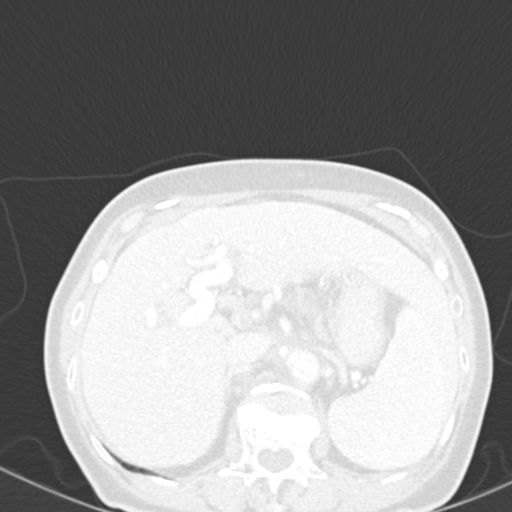
[im 107/129  soft-tissue]
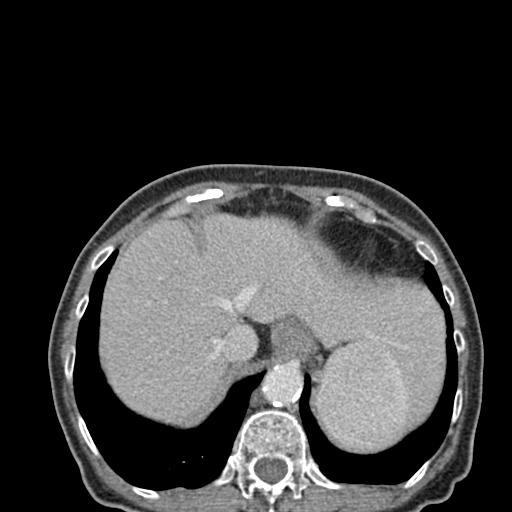
[im 107/129  lung]
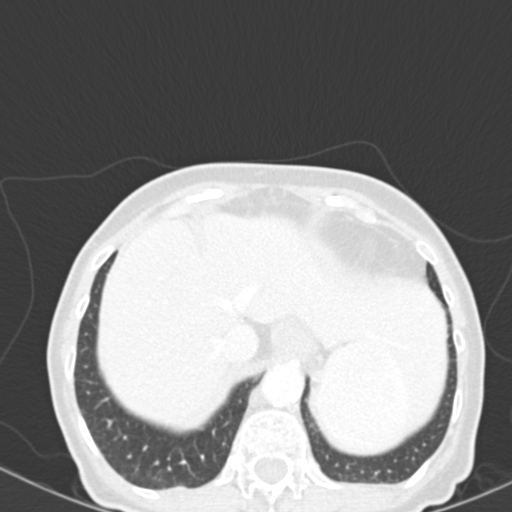
[im 118/129  soft-tissue]
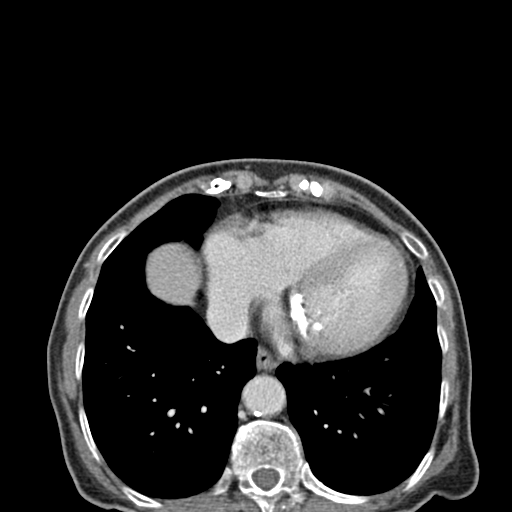
[im 118/129  lung]
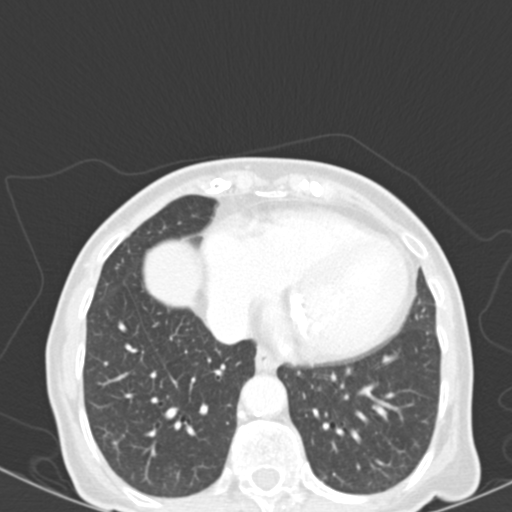
[im 118/129  bone]
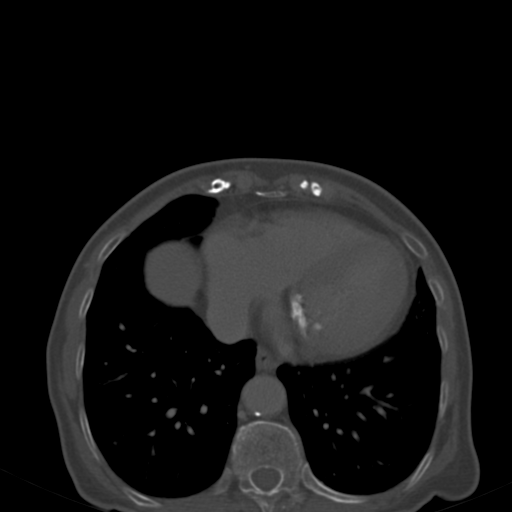

[Series 602: cor mpr · coronal · 0.61mm/px · 2 of 105 slices shown, 3 images]
[im 35/105  soft-tissue]
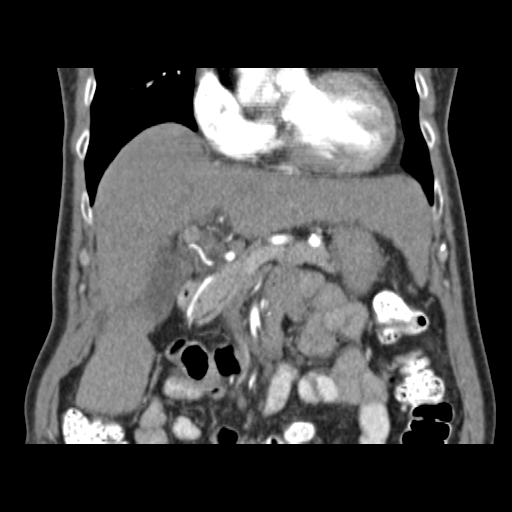
[im 35/105  bone]
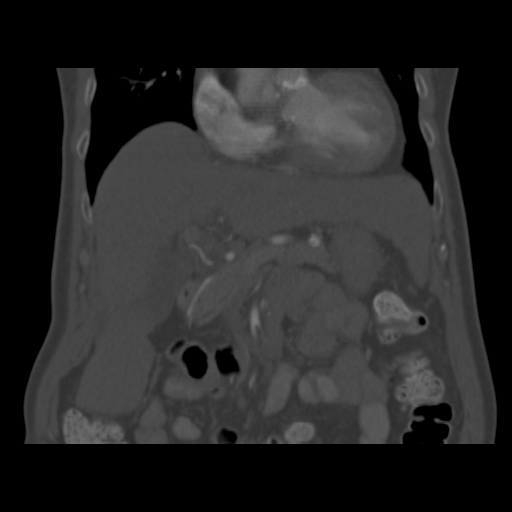
[im 70/105  soft-tissue]
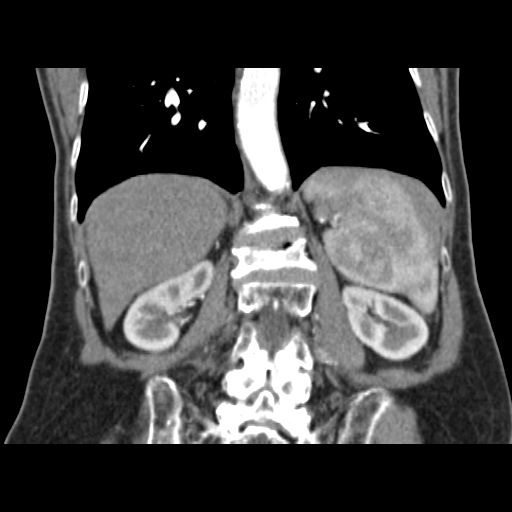

[12 of 46 positions shown; findings below may reference images not displayed]

FINDINGS: Lower chest: No pleural effusion identified. Subpleural scar in the
right base noted.

Hepatobiliary: The liver has a nodular contour. There is hypertrophy
of the lateral segment of left lobe. No focal liver abnormality
identified. There is mild nonspecific pericholecystic edema. No
biliary dilatation.

Pancreas: The pancreas appears normal.

Spleen: The spleen measures 10.9 cm in length. No focal splenic
abnormality.

Adrenals/Urinary Tract: Normal appearance of the adrenal glands. The
normal appearance of the right kidney. Cyst within the left kidney
measures 9 mm. Inferior pole left renal cyst measures 1 cm.

Stomach/Bowel: The appendix is visualized and appears normal. There
are multiple distal colonic diverticula without acute inflammation.

Vascular/Lymphatic: Calcified atherosclerotic disease involves the
abdominal aorta. No aneurysm. The portal vein appears patent but is
increased in caliber measuring up to 1.4 cm.

Reproductive: The uterus appears normal. 2.7 cm cyst is identified
in the left adnexa, image 85/series 6. The right adnexa is
unremarkable.

Other: No significant free fluid.  No fluid collections noted.

Musculoskeletal: The bones appear osteopenic. Previous right hip
arthroplasty. Degenerative disc disease noted within the lumbar
spine. There is a compression fracture involving the T9 vertebra and
the L1 vertebra. These are age indeterminate.
IMPRESSION: 1. Morphologic features of the liver compatible with cirrhosis. The
portal vein appears increased in caliber suggestive of portal venous
hypertension.
2. Aortic atherosclerosis.
3. Left kidney cysts.
4. Cyst in left ovary. This is almost certainly benign, but follow
up ultrasound is recommended in 1 year according to the Society of
Radiologists in Tltrasound43L3 Consensus Conference Statement (DANETTE
Einuj et al. Management of Asymptomatic Ovarian and Other Adnexal
Cysts Imaged at US: Society of Radiologists in Ultrasound Consensus
5. Age indeterminate thoracic and lumbar compression deformities.

## 2017-06-09 IMAGING — US US ART/VEN ABD/PELV/SCROTUM DOPPLER LTD
1 series · 13 of 25 positions shown · non-contrast
Comparison: None.

CLINICAL DATA: Jaundice, hepatic cirrhosis.

EXAM:
DUPLEX ULTRASOUND OF LIVER
TECHNIQUE: Color and duplex Doppler ultrasound was performed to evaluate the
hepatic in-flow and out-flow vessels. Exam is significantly limited
as patient does not speak English and unable to follow breathing
instructions.

[Series 1: us art/ven abd/pelv/scrotum doppler ltd · 0.14mm/px · 13 of 38 slices shown]
[im 1/38]
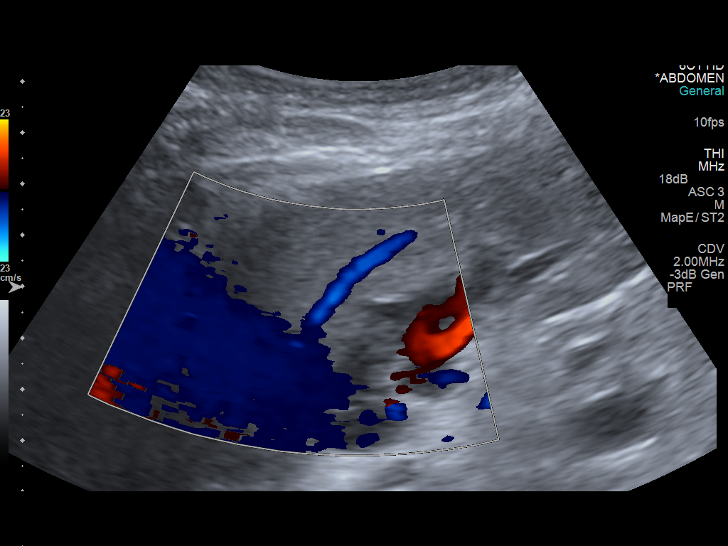
[im 4/38]
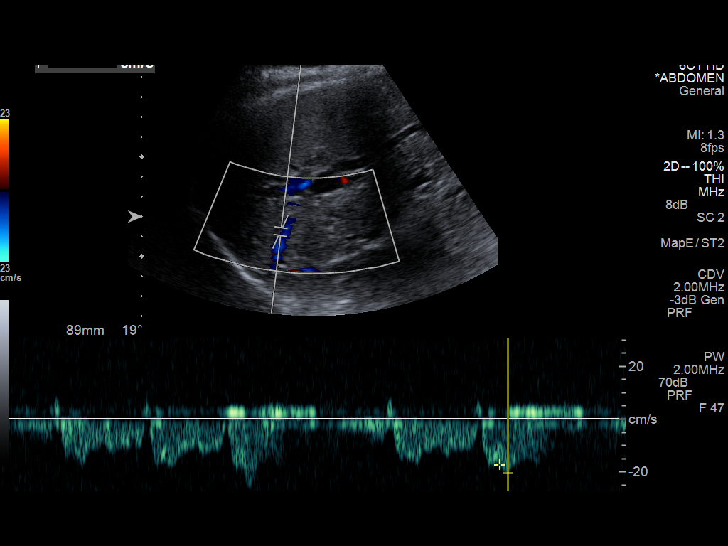
[im 7/38]
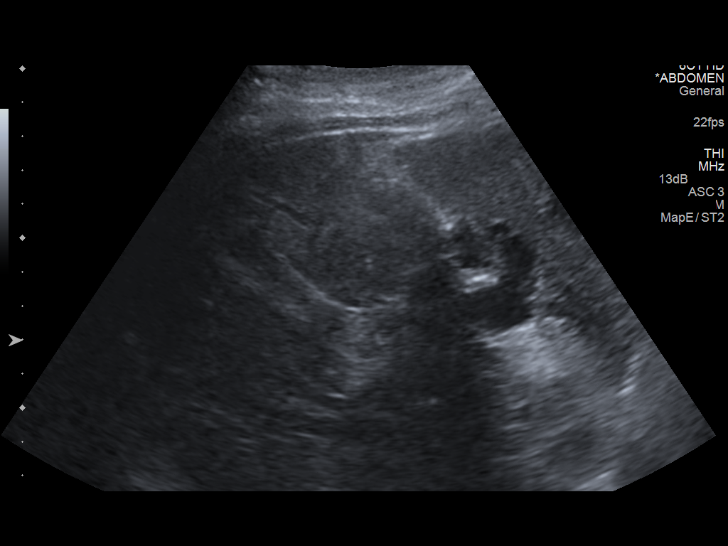
[im 10/38]
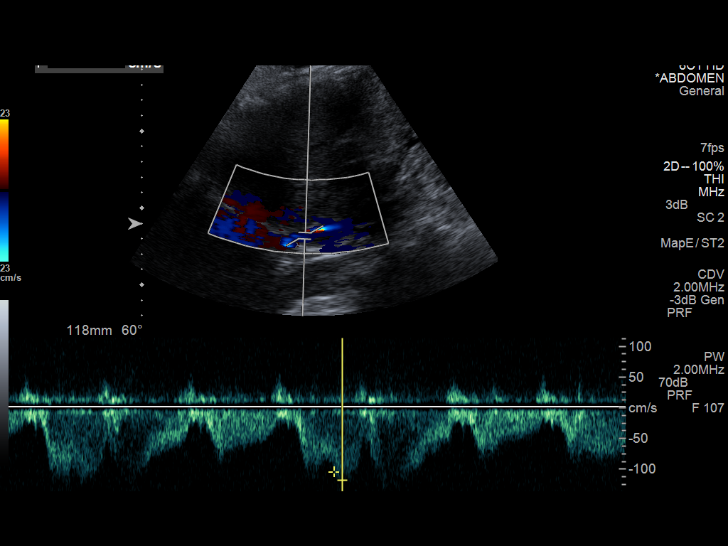
[im 13/38]
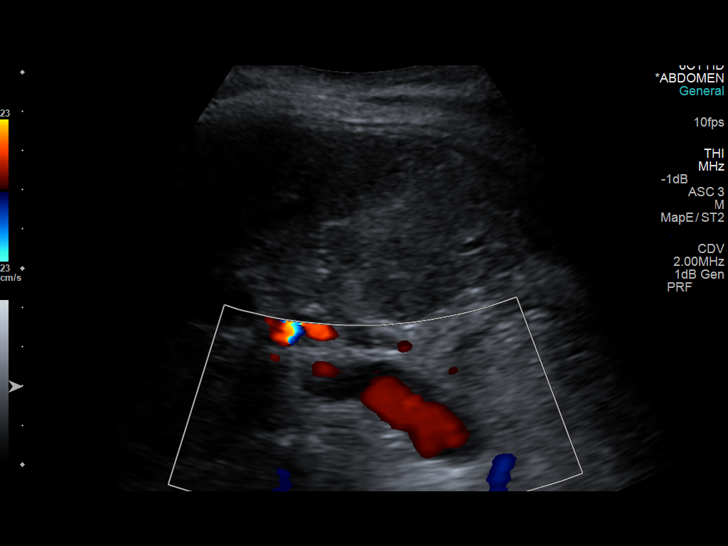
[im 16/38]
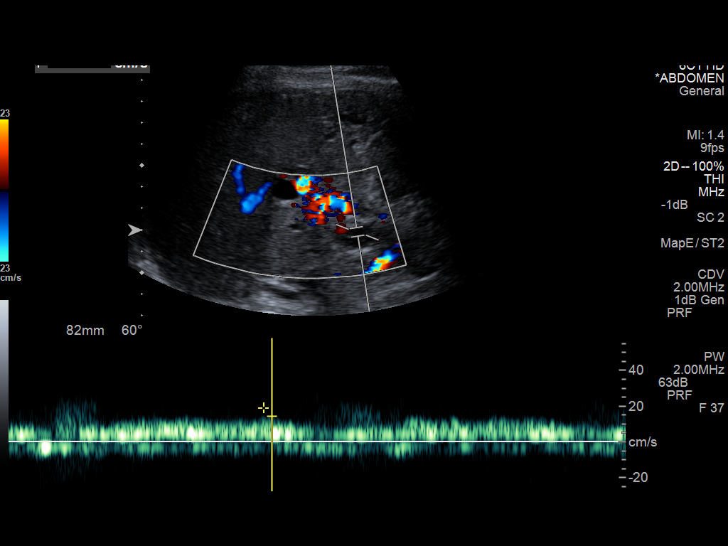
[im 19/38]
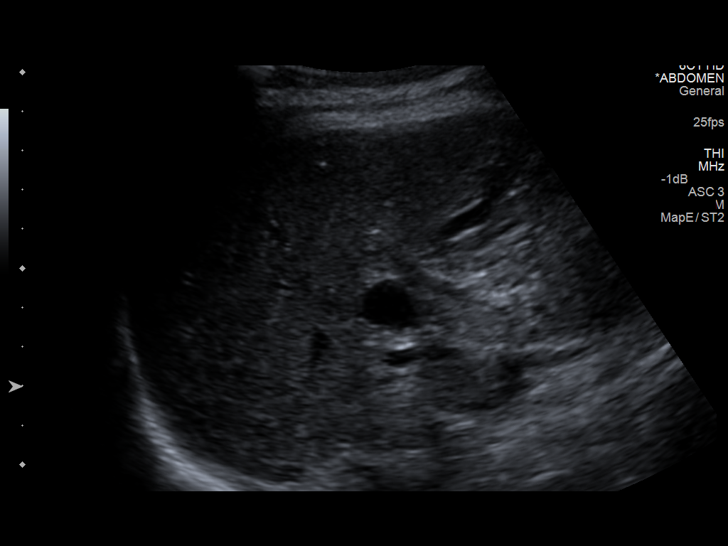
[im 22/38]
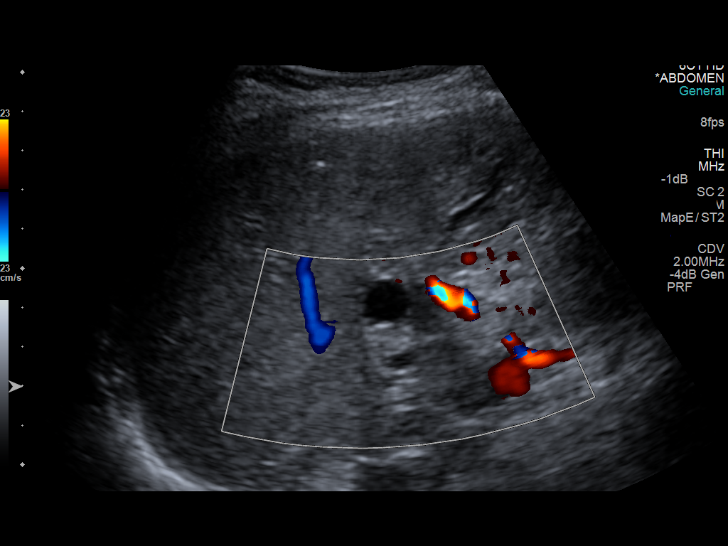
[im 25/38]
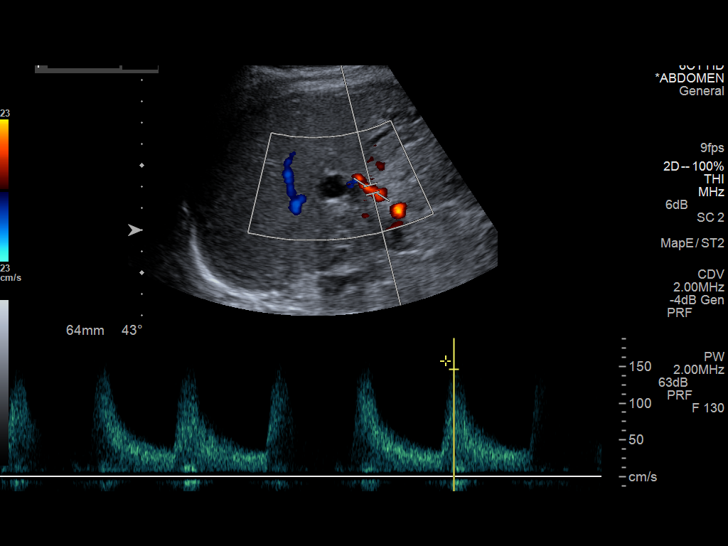
[im 28/38]
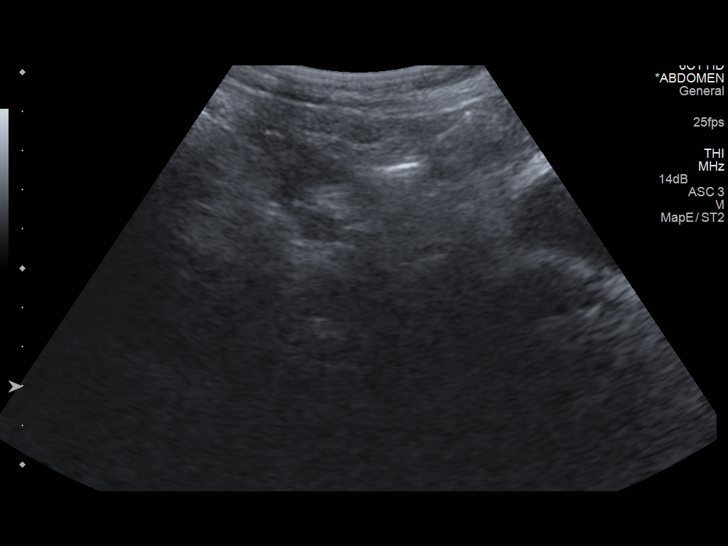
[im 31/38]
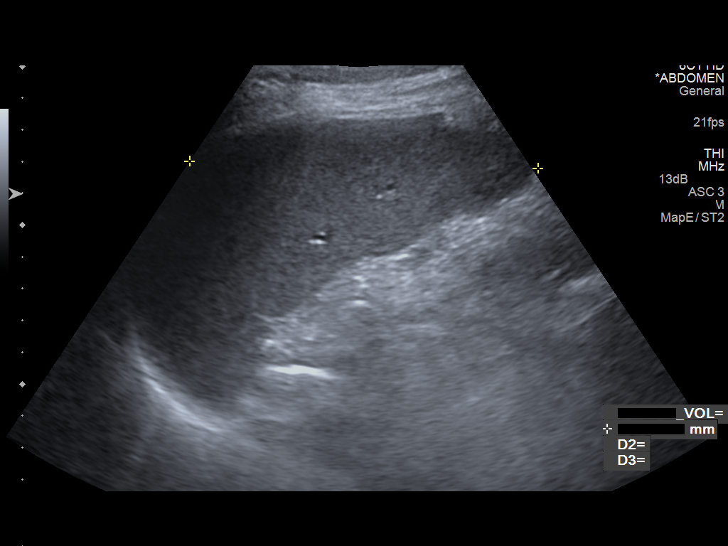
[im 34/38]
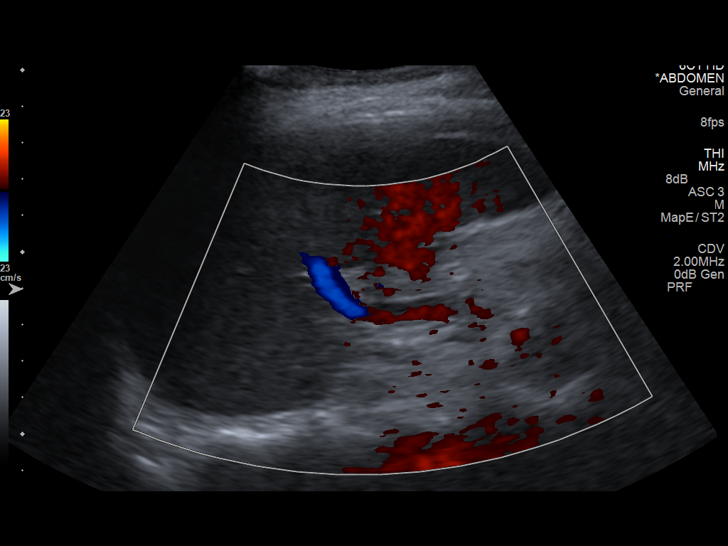
[im 38/38]
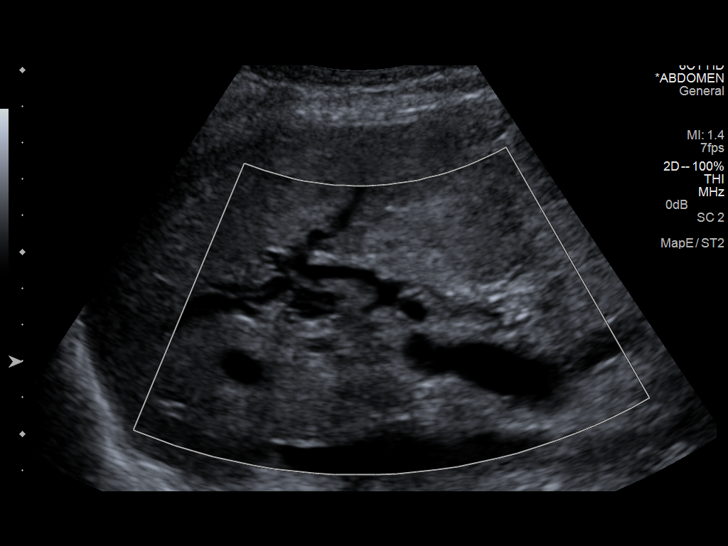

[13 of 25 positions shown; findings below may reference images not displayed]

FINDINGS: Portal Vein Velocities

Main: 18.1 cm/sec proximally ; hepatopetal flow is noted proximally
and in the midportion, but no flow is noted distally consistent with
portal venous thrombus.

Right:  Not visualized due to limitations described above.

Left:  Not visualized due to limitations described above.

Hepatic Vein Velocities

Right:  20.5 cm/sec ; hepatofugal flow is noted.

Middle:  Not visualized due to limitations described above.

Left:  29.7 Cm/sec ; hepatofugal flow is noted.

Hepatic Artery Velocity:  146 cm/sec

Splenic Vein Velocity:  17.1 cm/sec

Varices: Present.

Ascites: Present.
IMPRESSION: Exam is significantly limited as patient does not speak English and
unable to follow breathing instructions. There is probable occlusive
thrombus seen in the distal portal vein. Middle hepatic vein is not
visualized due to previously described limitations, but right and
left hepatic veins demonstrate normal flow and velocity. These
results will be called to the ordering clinician or representative
by the Radiologist Assistant, and communication documented in the
PACS or [employer] Dashboard.
